# Patient Record
Sex: Female | Born: 1965 | Race: Black or African American | Hispanic: No | Marital: Single | Smoking: Former smoker
Health system: Southern US, Community
[De-identification: ages and names within clinical notes are randomized; demographics above are authoritative.]

## PROBLEM LIST (undated history)

## (undated) DIAGNOSIS — M1712 Unilateral primary osteoarthritis, left knee: Secondary | ICD-10-CM

## (undated) DIAGNOSIS — F319 Bipolar disorder, unspecified: Secondary | ICD-10-CM

## (undated) DIAGNOSIS — M722 Plantar fascial fibromatosis: Secondary | ICD-10-CM

## (undated) DIAGNOSIS — D219 Benign neoplasm of connective and other soft tissue, unspecified: Secondary | ICD-10-CM

## (undated) DIAGNOSIS — M109 Gout, unspecified: Secondary | ICD-10-CM

## (undated) DIAGNOSIS — Z5189 Encounter for other specified aftercare: Secondary | ICD-10-CM

## (undated) DIAGNOSIS — F419 Anxiety disorder, unspecified: Secondary | ICD-10-CM

## (undated) DIAGNOSIS — E119 Type 2 diabetes mellitus without complications: Secondary | ICD-10-CM

## (undated) DIAGNOSIS — F1910 Other psychoactive substance abuse, uncomplicated: Secondary | ICD-10-CM

## (undated) DIAGNOSIS — I10 Essential (primary) hypertension: Secondary | ICD-10-CM

## (undated) DIAGNOSIS — F32A Depression, unspecified: Secondary | ICD-10-CM

## (undated) HISTORY — DX: Anxiety disorder, unspecified: F41.9

## (undated) HISTORY — DX: Other psychoactive substance abuse, uncomplicated: F19.10

## (undated) HISTORY — DX: Unilateral primary osteoarthritis, left knee: M17.12

## (undated) HISTORY — DX: Depression, unspecified: F32.A

## (undated) HISTORY — DX: Type 2 diabetes mellitus without complications: E11.9

## (undated) HISTORY — PX: ABDOMINAL HYSTERECTOMY: SHX81

## (undated) HISTORY — DX: Plantar fascial fibromatosis: M72.2

## (undated) HISTORY — DX: Essential (primary) hypertension: I10

---
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
## 1999-07-03 ENCOUNTER — Ambulatory Visit (HOSPITAL_COMMUNITY): Admission: RE | Admit: 1999-07-03 | Discharge: 1999-07-03 | Payer: Self-pay | Admitting: *Deleted

## 1999-07-30 ENCOUNTER — Ambulatory Visit (HOSPITAL_COMMUNITY): Admission: RE | Admit: 1999-07-30 | Discharge: 1999-07-30 | Payer: Self-pay | Admitting: *Deleted

## 1999-09-07 ENCOUNTER — Inpatient Hospital Stay (HOSPITAL_COMMUNITY): Admission: AD | Admit: 1999-09-07 | Discharge: 1999-09-09 | Payer: Self-pay | Admitting: *Deleted

## 2000-12-11 ENCOUNTER — Emergency Department (HOSPITAL_COMMUNITY): Admission: EM | Admit: 2000-12-11 | Discharge: 2000-12-11 | Payer: Self-pay | Admitting: Emergency Medicine

## 2000-12-16 ENCOUNTER — Emergency Department (HOSPITAL_COMMUNITY): Admission: EM | Admit: 2000-12-16 | Discharge: 2000-12-16 | Payer: Self-pay | Admitting: Emergency Medicine

## 2001-09-25 ENCOUNTER — Encounter: Payer: Self-pay | Admitting: Emergency Medicine

## 2001-09-25 ENCOUNTER — Emergency Department (HOSPITAL_COMMUNITY): Admission: EM | Admit: 2001-09-25 | Discharge: 2001-09-25 | Payer: Self-pay | Admitting: Emergency Medicine

## 2001-09-26 ENCOUNTER — Emergency Department (HOSPITAL_COMMUNITY): Admission: EM | Admit: 2001-09-26 | Discharge: 2001-09-27 | Payer: Self-pay

## 2001-09-27 ENCOUNTER — Encounter: Payer: Self-pay | Admitting: Emergency Medicine

## 2008-03-30 ENCOUNTER — Emergency Department (HOSPITAL_COMMUNITY): Admission: EM | Admit: 2008-03-30 | Discharge: 2008-03-31 | Payer: Self-pay | Admitting: *Deleted

## 2010-04-12 ENCOUNTER — Emergency Department (HOSPITAL_COMMUNITY)
Admission: EM | Admit: 2010-04-12 | Discharge: 2010-04-13 | Disposition: A | Discharge status: Home / Self Care | Payer: Self-pay | Attending: Emergency Medicine | Admitting: Emergency Medicine

## 2010-04-12 DIAGNOSIS — R059 Cough, unspecified: Secondary | ICD-10-CM | POA: Insufficient documentation

## 2010-04-12 DIAGNOSIS — J069 Acute upper respiratory infection, unspecified: Secondary | ICD-10-CM | POA: Insufficient documentation

## 2010-04-12 DIAGNOSIS — R6883 Chills (without fever): Secondary | ICD-10-CM | POA: Insufficient documentation

## 2010-04-12 DIAGNOSIS — J3489 Other specified disorders of nose and nasal sinuses: Secondary | ICD-10-CM | POA: Insufficient documentation

## 2010-04-12 DIAGNOSIS — R05 Cough: Secondary | ICD-10-CM | POA: Insufficient documentation

## 2010-04-12 DIAGNOSIS — IMO0001 Reserved for inherently not codable concepts without codable children: Secondary | ICD-10-CM | POA: Insufficient documentation

## 2010-04-13 ENCOUNTER — Emergency Department (HOSPITAL_COMMUNITY): Payer: Self-pay

## 2010-12-28 ENCOUNTER — Emergency Department (HOSPITAL_COMMUNITY)
Admission: EM | Admit: 2010-12-28 | Discharge: 2010-12-28 | Disposition: A | Discharge status: Home / Self Care | Payer: Self-pay | Attending: Emergency Medicine | Admitting: Emergency Medicine

## 2010-12-28 ENCOUNTER — Encounter: Payer: Self-pay | Admitting: *Deleted

## 2010-12-28 ENCOUNTER — Emergency Department (HOSPITAL_COMMUNITY): Payer: Self-pay

## 2010-12-28 DIAGNOSIS — M79673 Pain in unspecified foot: Secondary | ICD-10-CM

## 2010-12-28 DIAGNOSIS — M7989 Other specified soft tissue disorders: Secondary | ICD-10-CM | POA: Insufficient documentation

## 2010-12-28 DIAGNOSIS — F17200 Nicotine dependence, unspecified, uncomplicated: Secondary | ICD-10-CM | POA: Insufficient documentation

## 2010-12-28 DIAGNOSIS — M79609 Pain in unspecified limb: Secondary | ICD-10-CM | POA: Insufficient documentation

## 2010-12-28 DIAGNOSIS — M109 Gout, unspecified: Secondary | ICD-10-CM | POA: Insufficient documentation

## 2010-12-28 HISTORY — DX: Encounter for other specified aftercare: Z51.89

## 2010-12-28 HISTORY — DX: Benign neoplasm of connective and other soft tissue, unspecified: D21.9

## 2010-12-28 LAB — DIFFERENTIAL
Eosinophils Relative: 2 % (ref 0–5)
Lymphocytes Relative: 23 % (ref 12–46)
Lymphs Abs: 2.8 10*3/uL (ref 0.7–4.0)
Monocytes Absolute: 1.1 10*3/uL — ABNORMAL HIGH (ref 0.1–1.0)
Monocytes Relative: 9 % (ref 3–12)
Neutro Abs: 8.2 10*3/uL — ABNORMAL HIGH (ref 1.7–7.7)

## 2010-12-28 LAB — POCT I-STAT, CHEM 8
BUN: 14 mg/dL (ref 6–23)
Calcium, Ion: 1.16 mmol/L (ref 1.12–1.32)
Chloride: 102 mEq/L (ref 96–112)
Creatinine, Ser: 1 mg/dL (ref 0.50–1.10)
Glucose, Bld: 94 mg/dL (ref 70–99)
Potassium: 3.5 mEq/L (ref 3.5–5.1)

## 2010-12-28 LAB — CBC
HCT: 43.5 % (ref 36.0–46.0)
Hemoglobin: 14.8 g/dL (ref 12.0–15.0)
MCV: 87.5 fL (ref 78.0–100.0)
RBC: 4.97 MIL/uL (ref 3.87–5.11)
RDW: 14.5 % (ref 11.5–15.5)
WBC: 12.3 10*3/uL — ABNORMAL HIGH (ref 4.0–10.5)

## 2010-12-28 MED ORDER — OXYCODONE-ACETAMINOPHEN 5-325 MG PO TABS: 1.0000 | ORAL_TABLET | Freq: Four times a day (QID) | ORAL | Status: DC | PRN

## 2010-12-28 MED ORDER — OXYCODONE-ACETAMINOPHEN 5-325 MG PO TABS
ORAL_TABLET | ORAL | Status: AC
  Administered 2010-12-28: 1
  Filled 2010-12-28: qty 1

## 2010-12-28 MED ORDER — PREDNISONE 50 MG PO TABS: 50.0000 mg | ORAL_TABLET | Freq: Every day | ORAL | Status: DC

## 2010-12-28 NOTE — ED Notes (Signed)
Pt reports walking to a friends house when she had sudden onset of (L) foot swelling and pain.  No bruising, deformity or swelling noted to foot. Pt noted to be laying on leg on assessment.  Pt given a gown to change into.  Pt moving extremity slowly but without difficulty.  Pt resting prior to RN assessment.  Skin warm, dry and intact.  Neuro intact.

## 2010-12-28 NOTE — ED Notes (Signed)
Patient reports sudden onset right foot pain on the top of the foot states it is swollen. When asked to rate pain patient is unable to stay awake for questioning.

## 2010-12-28 NOTE — ED Notes (Signed)
Social work paged for pt to receive Electronics engineer. Pt refused bus pas due to foot pain. sts unable to walk. Pt given crutches

## 2010-12-28 NOTE — ED Provider Notes (Signed)
History     CSN: [id number] Arrival date & time: 12/28/2010  2:24 AM   First MD Initiated Contact with Patient 12/28/10 0308      Chief Complaint  Patient presents with  . Foot Pain     HPI  History provided by the patient. Patient presents with complaints of acute left foot pain that began while she was asleep. She denies similar symptoms in the past. Pain is worse with any movement or palpation of foot. Pain is located in the whole foot. Patient denies any injury or trauma to foot. Patient did not try taking anything for the pain. She denies any alleviating factors. She denies any significant past medical history.   Past Medical History  Diagnosis Date  . Blood transfusion   . Fibroid tumor     Past Surgical History  Procedure Date  . Abdominal surgery     History reviewed. No pertinent family history.  History  Substance Use Topics  . Smoking status: Current Everyday Smoker  . Smokeless tobacco: Not on file  . Alcohol Use: Yes     "a little bit"    OB History    Grav Para Term Preterm Abortions TAB SAB Ect Mult Living                  Review of Systems  Constitutional: Negative for fever.  Respiratory: Negative for shortness of breath.   Cardiovascular: Negative for chest pain and palpitations.  All other systems reviewed and are negative.    Allergies  Penicillins  Home Medications  No current outpatient prescriptions on file.  BP 105/73  Pulse 108  Temp(Src) 97.5 F (36.4 C) (Oral)  Resp 23  SpO2 95%  LMP 12/28/2010  Physical Exam  Nursing note and vitals reviewed. Constitutional: She is oriented to person, place, and time. She appears well-developed and well-nourished. No distress.  HENT:  Head: Normocephalic.  Cardiovascular: Normal rate, regular rhythm and normal heart sounds.   Pulmonary/Chest: Effort normal.  Musculoskeletal:       Mild swelling and increased warmth to the entire left foot. Patient with tenderness to palpation even  with very light touch over dorsal and plantar aspects of left foot. Patient able to move toes normally. No deformity or crepitus the foot.  Neurological: She is alert and oriented to person, place, and time.  Skin: Skin is warm. No rash noted. No erythema.  Psychiatric: She has a normal mood and affect. Her behavior is normal.    ED Course  Procedures (including critical care time)  Labs Reviewed  CBC - Abnormal; Notable for the following:    WBC 12.3 (*)    All other components within normal limits  DIFFERENTIAL - Abnormal; Notable for the following:    Neutro Abs 8.2 (*)    Monocytes Absolute 1.1 (*)    All other components within normal limits  POCT I-STAT, CHEM 8 - Abnormal; Notable for the following:    Hemoglobin 15.6 (*)    All other components within normal limits  URIC ACID  I-STAT, CHEM 8   Results for orders placed during the hospital encounter of 12/28/10  CBC      Component Value Range   WBC 12.3 (*) 4.0 - 10.5 (K/uL)   RBC 4.97  3.87 - 5.11 (MIL/uL)   Hemoglobin 14.8  12.0 - 15.0 (g/dL)   HCT 86.5  78.4 - 69.6 (%)   MCV 87.5  78.0 - 100.0 (fL)   MCH 29.8  26.0 -  34.0 (pg)   MCHC 34.0  30.0 - 36.0 (g/dL)   RDW 40.9  81.1 - 91.4 (%)   Platelets 376  150 - 400 (K/uL)  DIFFERENTIAL      Component Value Range   Neutrophils Relative 67  43 - 77 (%)   Neutro Abs 8.2 (*) 1.7 - 7.7 (K/uL)   Lymphocytes Relative 23  12 - 46 (%)   Lymphs Abs 2.8  0.7 - 4.0 (K/uL)   Monocytes Relative 9  3 - 12 (%)   Monocytes Absolute 1.1 (*) 0.1 - 1.0 (K/uL)   Eosinophils Relative 2  0 - 5 (%)   Eosinophils Absolute 0.2  0.0 - 0.7 (K/uL)   Basophils Relative 0  0 - 1 (%)   Basophils Absolute 0.0  0.0 - 0.1 (K/uL)  URIC ACID      Component Value Range   Uric Acid, Serum 5.2  2.4 - 7.0 (mg/dL)  POCT I-STAT, CHEM 8      Component Value Range   Sodium 138  135 - 145 (mEq/L)   Potassium 3.5  3.5 - 5.1 (mEq/L)   Chloride 102  96 - 112 (mEq/L)   BUN 14  6 - 23 (mg/dL)    Creatinine, Ser 7.82  0.50 - 1.10 (mg/dL)   Glucose, Bld 94  70 - 99 (mg/dL)   Calcium, Ion 9.56  2.13 - 1.32 (mmol/L)   TCO2 25  0 - 100 (mmol/L)   Hemoglobin 15.6 (*) 12.0 - 15.0 (g/dL)   HCT 08.6  57.8 - 46.9 (%)     No results found.   No diagnosis found.    MDM  4:15AM patient seen and evaluated. Patient in no acute distress   6 clock a.m. patient discussed in sign out with Lawyer PA-C. He will follow results of x-ray and dispo patient appropriately.     Angus Seller, PA 12/28/10 0630

## 2010-12-28 NOTE — ED Notes (Signed)
I gave the patient a warm blanket. 

## 2010-12-28 NOTE — ED Notes (Signed)
Pt was seen by social work and informed that she is unable to receive a cab voucher. Pt given bus pass. Pt was very upset and snatched her discharge papers and bus pass out of my hands and walked away with no problem.

## 2010-12-28 NOTE — Progress Notes (Signed)
CSW received call from RN French Ana re: transportation:  RN stated a bus pass was offered for which pt refused stating EMS informed her a taxi voucher would be provided. RN confirmed pt has been cleared for safe d/c, medicated, provided crutches and has been witnessed walking with no noted distress to foot. ( pt is dx with gout). CSW met with pt to assess situation. Pt was initially tearful as she spoke of the need for a cab yet quickly became angry coupled with verbal aggression. CSW empathized and offered to call her emergency contacts listed to assist with transportation as well as exploring barriers to riding a bus informing pt a cab voucher will not be provided.  Pt expressed she is not able to walk and accused this Clinical research associate and ED staff of being "worthless".  CSW again attempted to explore barriers to riding the bus explaining she does not have to change busses on her route and this Clinical research associate and/or tech would be able to wheel her to the bus stop. Pt demanded to speak to the Supervisor for which this Clinical research associate requested.   Pt continued to verbalize aggressive mannerism as this Clinical research associate attempted to de-escalate. CSW witnessed pt to stand up and walk with her crutches  with no issues down the hall where she began to complain to a visitor and ask for assistance. Pt yelled down the hall to get her the "dam bus pass and her discharge documents" and to stop looking at her. CSW and pts bedside RN French Ana provided pt x1 bus pass and d/c documents for which pt aggressively snatched the documents from the RN. Pt denied any further CSW or RN needs. Pt has been discharged with safe transportation option, RX, and printed documentation of her dx, tx plan and f/u recommendations.  Refer to RN documentation for additional information surrounding this issue.   Dionne Milo MSW, LCSWA [facility] Emergency Dept. Weekend/Social Worker [phone]

## 2010-12-28 NOTE — ED Notes (Signed)
Sudden onset of  L leg to foot pain, (pain does not go into hip, buttocks or groin), denies sx other than pain. Denies injury. Onset around , here by EMS.

## 2010-12-28 NOTE — ED Provider Notes (Signed)
Medical screening examination/treatment/procedure(s) were performed by non-physician practitioner and as supervising physician I was immediately available for consultation/collaboration.   Zainab Crumrine, MD 12/28/10 1551 

## 2010-12-31 NOTE — Progress Notes (Signed)
Patient returned to the ED today in search of assistance on filling the two prescriptions she received on 12/28/2010. I explained to pt that we could not assist with the Percocet and she accepted the explanation. I did let her know that I contacted [facility] and discovered the out-of-pocket cost would be less than $6. She will ask a friend if they have money to lend her. Per the pharmacy, pt is eligible for the [facility] fund, so we could fill the 5-day prescription for prednisone for her. Patient was very appreciative of the services we provided.

## 2011-01-08 ENCOUNTER — Emergency Department (HOSPITAL_COMMUNITY)
Admission: EM | Admit: 2011-01-08 | Discharge: 2011-01-08 | Disposition: A | Discharge status: Home / Self Care | Payer: Self-pay | Attending: Emergency Medicine | Admitting: Emergency Medicine

## 2011-01-08 ENCOUNTER — Emergency Department (HOSPITAL_COMMUNITY): Payer: Self-pay

## 2011-01-08 ENCOUNTER — Encounter (HOSPITAL_COMMUNITY): Payer: Self-pay | Admitting: *Deleted

## 2011-01-08 DIAGNOSIS — R1033 Periumbilical pain: Secondary | ICD-10-CM | POA: Insufficient documentation

## 2011-01-08 DIAGNOSIS — R197 Diarrhea, unspecified: Secondary | ICD-10-CM | POA: Insufficient documentation

## 2011-01-08 DIAGNOSIS — F17200 Nicotine dependence, unspecified, uncomplicated: Secondary | ICD-10-CM | POA: Insufficient documentation

## 2011-01-08 DIAGNOSIS — R112 Nausea with vomiting, unspecified: Secondary | ICD-10-CM | POA: Insufficient documentation

## 2011-01-08 LAB — DIFFERENTIAL
Basophils Absolute: 0 10*3/uL (ref 0.0–0.1)
Basophils Relative: 0 % (ref 0–1)
Eosinophils Relative: 2 % (ref 0–5)
Monocytes Absolute: 0.5 10*3/uL (ref 0.1–1.0)
Monocytes Relative: 5 % (ref 3–12)
Neutro Abs: 6.9 10*3/uL (ref 1.7–7.7)

## 2011-01-08 LAB — CBC
HCT: 46.7 % — ABNORMAL HIGH (ref 36.0–46.0)
Hemoglobin: 15.6 g/dL — ABNORMAL HIGH (ref 12.0–15.0)
MCHC: 33.4 g/dL (ref 30.0–36.0)
MCV: 87.8 fL (ref 78.0–100.0)
RDW: 14.2 % (ref 11.5–15.5)

## 2011-01-08 LAB — PREGNANCY, URINE: Preg Test, Ur: NEGATIVE

## 2011-01-08 LAB — COMPREHENSIVE METABOLIC PANEL
AST: 23 U/L (ref 0–37)
Albumin: 4.9 g/dL (ref 3.5–5.2)
BUN: 17 mg/dL (ref 6–23)
CO2: 27 mEq/L (ref 19–32)
Calcium: 10.3 mg/dL (ref 8.4–10.5)
Chloride: 103 mEq/L (ref 96–112)
Creatinine, Ser: 0.96 mg/dL (ref 0.50–1.10)
GFR calc non Af Amer: 69 mL/min — ABNORMAL LOW (ref >90)
Total Bilirubin: 0.3 mg/dL (ref 0.3–1.2)

## 2011-01-08 LAB — LIPASE, BLOOD: Lipase: 25 U/L (ref 11–59)

## 2011-01-08 LAB — URINALYSIS, ROUTINE W REFLEX MICROSCOPIC
Leukocytes, UA: NEGATIVE
Nitrite: NEGATIVE
Protein, ur: NEGATIVE mg/dL
Urobilinogen, UA: 1 mg/dL (ref 0.0–1.0)

## 2011-01-08 MED ORDER — ONDANSETRON HCL 4 MG/2ML IJ SOLN
4.0000 mg | Freq: Once | INTRAMUSCULAR | Status: AC
  Administered 2011-01-08: 4 mg via INTRAVENOUS
  Filled 2011-01-08: qty 2

## 2011-01-08 MED ORDER — HYDROMORPHONE HCL PF 1 MG/ML IJ SOLN
1.0000 mg | Freq: Once | INTRAMUSCULAR | Status: AC
  Administered 2011-01-08: 1 mg via INTRAVENOUS
  Filled 2011-01-08: qty 1

## 2011-01-08 MED ORDER — HYDROCODONE-ACETAMINOPHEN 5-325 MG PO TABS: 1.0000 | ORAL_TABLET | Freq: Four times a day (QID) | ORAL | Status: AC | PRN

## 2011-01-08 MED ORDER — PROMETHAZINE HCL 25 MG PO TABS: 25.0000 mg | ORAL_TABLET | Freq: Four times a day (QID) | ORAL | Status: AC | PRN

## 2011-01-08 MED ORDER — ONDANSETRON 8 MG PO TBDP: 8.0000 mg | ORAL_TABLET | Freq: Three times a day (TID) | ORAL | Status: AC | PRN

## 2011-01-08 MED ORDER — SODIUM CHLORIDE 0.9 % IV BOLUS (SEPSIS)
1000.0000 mL | Freq: Once | INTRAVENOUS | Status: AC
  Administered 2011-01-08: 1000 mL via INTRAVENOUS

## 2011-01-08 NOTE — ED Provider Notes (Signed)
CT of the abdomen negative for complete or partial bowel obstruction however does show some inflammatory changes to the small bowel which may be related to an enteritis which could be viral in etiology patient's white count is not elevated no acute abdominal process noted on CT.   Discharge patient home with antinausea medicine and pain medicine and she has followup available.  Shelda Jakes, MD 01/08/11 [phone]

## 2011-01-08 NOTE — ED Notes (Signed)
[id number]<AV> Expected date:01/08/11<BR> Expected time: 1:24 PM<BR> Means of arrival:Ambulance<BR> Comments:<BR> M80

## 2011-01-08 NOTE — ED Notes (Signed)
Into assess pt, pt resting with eyes closed, upon arousal pt states she is hurting and wants some pain meds now.  Told pt I would check with provider for further orders.

## 2011-01-08 NOTE — ED Provider Notes (Signed)
History     CSN: [id number] Arrival date & time: 01/08/2011  1:45 PM   First MD Initiated Contact with Patient 01/08/11 1405      Chief Complaint  Patient presents with  . Abdominal Pain    Per EMS pt ate fish last night and experienced severe abd pain with nausea and vomiting x's 4 today.     (Consider location/radiation/quality/duration/timing/severity/associated sxs/prior treatment) HPI Comments: Patient presents with periumbilical pain that started last night shortly after eating dinner. She had 4 episodes of nausea and vomiting and is unable to keep any down today. She's also had 2 episodes of loose stools. She denies fevers, chills, dysuria, hematuria, vaginal bleeding or discharge. She denies any chest pain or shortness of breath.  She's had abdominal pain like this before but never this severe. He's had surgery in the past for fibroid tumors.  The history is provided by the patient.    Past Medical History  Diagnosis Date  . Blood transfusion   . Fibroid tumor     Past Surgical History  Procedure Date  . Abdominal surgery     History reviewed. No pertinent family history.  History  Substance Use Topics  . Smoking status: Current Everyday Smoker  . Smokeless tobacco: Not on file  . Alcohol Use: Yes     "a little bit"    OB History    Grav Para Term Preterm Abortions TAB SAB Ect Mult Living                  Review of Systems  Constitutional: Positive for appetite change. Negative for fever, activity change and fatigue.  HENT: Negative for congestion and rhinorrhea.   Respiratory: Negative for cough, chest tightness and shortness of breath.   Cardiovascular: Negative for chest pain.  Gastrointestinal: Positive for nausea, vomiting, abdominal pain and diarrhea.  Genitourinary: Negative for dysuria, hematuria, vaginal bleeding and vaginal discharge.  Musculoskeletal: Negative for back pain.  Skin: Negative for rash.  Neurological: Negative for weakness and  headaches.    Allergies  Penicillins  Home Medications   Current Outpatient Rx  Name Route Sig Dispense Refill  . OXYCODONE-ACETAMINOPHEN 5-325 MG PO TABS Oral Take 1 tablet by mouth every 6 (six) hours as needed. pain     . PREDNISONE 50 MG PO TABS Oral Take 50 mg by mouth daily. Pt states she did not start this medication yet.       BP 144/83  Pulse 71  Temp(Src) 99.1 F (37.3 C) (Oral)  Resp 16  SpO2 100%  LMP 12/28/2010  Physical Exam  Constitutional: She is oriented to person, place, and time. She appears well-developed and well-nourished. She appears distressed.       Holding abdomen, rocking back and forth in bed  HENT:  Head: Normocephalic and atraumatic.  Mouth/Throat: Oropharynx is clear and moist. No oropharyngeal exudate.  Eyes: Conjunctivae and EOM are normal. Pupils are equal, round, and reactive to light.  Neck: Normal range of motion. Neck supple.  Cardiovascular: Normal rate, regular rhythm and normal heart sounds.   Pulmonary/Chest: Effort normal and breath sounds normal. No respiratory distress.  Abdominal: Soft. There is tenderness. There is guarding.       Periumbilical tenderness with guarding.  Negative Murphy's sign.  No pain at McBurney's point.    Musculoskeletal: Normal range of motion. She exhibits no edema and no tenderness.  Neurological: She is alert and oriented to person, place, and time. No cranial nerve deficit.  Skin:  Skin is warm.    ED Course  Procedures (including critical care time)  Labs Reviewed  CBC - Abnormal; Notable for the following:    RBC 5.32 (*)    Hemoglobin 15.6 (*)    HCT 46.7 (*)    Platelets 430 (*)    All other components within normal limits  COMPREHENSIVE METABOLIC PANEL - Abnormal; Notable for the following:    Total Protein 9.2 (*)    GFR calc non Af Amer 69 (*)    GFR calc Af Amer 80 (*)    All other components within normal limits  URINALYSIS, ROUTINE W REFLEX MICROSCOPIC - Abnormal; Notable for the  following:    Ketones, ur TRACE (*)    All other components within normal limits  DIFFERENTIAL  LIPASE, BLOOD  PREGNANCY, URINE   No results found.   No diagnosis found.    MDM  Abdominal pain, nausea, vomiting, diarrhea for the past 24 hours. Vitals are stable patient is in significant amount of pain and difficult to examine.  We'll obtain labs, urinalysis, treat symptoms and provide IV hydration.   Pending CT abdomen at change of shift.  Dr. Deretha Emory to disposition patient.     Glynn Octave, MD 01/08/11 [phone]

## 2013-06-30 ENCOUNTER — Emergency Department (HOSPITAL_COMMUNITY): Payer: Self-pay

## 2013-06-30 ENCOUNTER — Inpatient Hospital Stay (HOSPITAL_COMMUNITY)
Admission: EM | Admit: 2013-06-30 | Discharge: 2013-07-02 | DRG: 153 | Disposition: A | Discharge status: Home / Self Care | Payer: Self-pay | Attending: Internal Medicine | Admitting: Internal Medicine

## 2013-06-30 ENCOUNTER — Encounter (HOSPITAL_COMMUNITY): Payer: Self-pay | Admitting: Emergency Medicine

## 2013-06-30 DIAGNOSIS — IMO0002 Reserved for concepts with insufficient information to code with codable children: Secondary | ICD-10-CM

## 2013-06-30 DIAGNOSIS — Z79899 Other long term (current) drug therapy: Secondary | ICD-10-CM

## 2013-06-30 DIAGNOSIS — J039 Acute tonsillitis, unspecified: Secondary | ICD-10-CM

## 2013-06-30 DIAGNOSIS — F319 Bipolar disorder, unspecified: Secondary | ICD-10-CM | POA: Diagnosis present

## 2013-06-30 DIAGNOSIS — J36 Peritonsillar abscess: Secondary | ICD-10-CM | POA: Diagnosis present

## 2013-06-30 DIAGNOSIS — Z880 Allergy status to penicillin: Secondary | ICD-10-CM

## 2013-06-30 DIAGNOSIS — B950 Streptococcus, group A, as the cause of diseases classified elsewhere: Secondary | ICD-10-CM | POA: Diagnosis present

## 2013-06-30 DIAGNOSIS — F17200 Nicotine dependence, unspecified, uncomplicated: Secondary | ICD-10-CM | POA: Diagnosis present

## 2013-06-30 DIAGNOSIS — D72829 Elevated white blood cell count, unspecified: Secondary | ICD-10-CM

## 2013-06-30 DIAGNOSIS — J020 Streptococcal pharyngitis: Principal | ICD-10-CM | POA: Diagnosis present

## 2013-06-30 HISTORY — DX: Gout, unspecified: M10.9

## 2013-06-30 HISTORY — DX: Bipolar disorder, unspecified: F31.9

## 2013-06-30 LAB — COMPREHENSIVE METABOLIC PANEL
ALT: 9 U/L (ref 0–35)
AST: 11 U/L (ref 0–37)
Albumin: 4.1 g/dL (ref 3.5–5.2)
Alkaline Phosphatase: 56 U/L (ref 39–117)
BILIRUBIN TOTAL: 0.4 mg/dL (ref 0.3–1.2)
BUN: 14 mg/dL (ref 6–23)
CALCIUM: 9.5 mg/dL (ref 8.4–10.5)
CHLORIDE: 102 meq/L (ref 96–112)
CO2: 23 meq/L (ref 19–32)
CREATININE: 1.06 mg/dL (ref 0.50–1.10)
GFR, EST AFRICAN AMERICAN: 70 mL/min — AB (ref >90)
GFR, EST NON AFRICAN AMERICAN: 60 mL/min — AB (ref >90)
GLUCOSE: 104 mg/dL — AB (ref 70–99)
Potassium: 3.7 mEq/L (ref 3.7–5.3)
Sodium: 140 mEq/L (ref 137–147)
Total Protein: 7.8 g/dL (ref 6.0–8.3)

## 2013-06-30 LAB — CBC WITH DIFFERENTIAL/PLATELET
Basophils Absolute: 0 10*3/uL (ref 0.0–0.1)
Basophils Relative: 0 % (ref 0–1)
EOS PCT: 0 % (ref 0–5)
Eosinophils Absolute: 0 10*3/uL (ref 0.0–0.7)
HEMATOCRIT: 42.7 % (ref 36.0–46.0)
HEMOGLOBIN: 14.2 g/dL (ref 12.0–15.0)
LYMPHS ABS: 2.5 10*3/uL (ref 0.7–4.0)
LYMPHS PCT: 14 % (ref 12–46)
MCH: 29.3 pg (ref 26.0–34.0)
MCHC: 33.3 g/dL (ref 30.0–36.0)
MCV: 88.2 fL (ref 78.0–100.0)
MONO ABS: 0.9 10*3/uL (ref 0.1–1.0)
Monocytes Relative: 5 % (ref 3–12)
Neutro Abs: 14.3 10*3/uL — ABNORMAL HIGH (ref 1.7–7.7)
Neutrophils Relative %: 81 % — ABNORMAL HIGH (ref 43–77)
Platelets: 320 10*3/uL (ref 150–400)
RBC: 4.84 MIL/uL (ref 3.87–5.11)
RDW: 14.6 % (ref 11.5–15.5)
WBC: 17.8 10*3/uL — AB (ref 4.0–10.5)

## 2013-06-30 LAB — TROPONIN I: Troponin I: <0.3 ng/mL (ref <0.30)

## 2013-06-30 LAB — URINALYSIS, ROUTINE W REFLEX MICROSCOPIC
BILIRUBIN URINE: NEGATIVE
Glucose, UA: NEGATIVE mg/dL
Hgb urine dipstick: NEGATIVE
Ketones, ur: NEGATIVE mg/dL
Leukocytes, UA: NEGATIVE
NITRITE: NEGATIVE
PROTEIN: NEGATIVE mg/dL
SPECIFIC GRAVITY, URINE: 1.023 (ref 1.005–1.030)
UROBILINOGEN UA: 0.2 mg/dL (ref 0.0–1.0)
pH: 6 (ref 5.0–8.0)

## 2013-06-30 LAB — RAPID STREP SCREEN (MED CTR MEBANE ONLY): Streptococcus, Group A Screen (Direct): POSITIVE — AB

## 2013-06-30 LAB — LIPASE, BLOOD: LIPASE: 18 U/L (ref 11–59)

## 2013-06-30 LAB — POC URINE PREG, ED: Preg Test, Ur: NEGATIVE

## 2013-06-30 MED ORDER — DEXAMETHASONE SODIUM PHOSPHATE 10 MG/ML IJ SOLN: 10.0000 mg | Freq: Three times a day (TID) | INTRAMUSCULAR | Status: DC

## 2013-06-30 MED ORDER — ENOXAPARIN SODIUM 40 MG/0.4ML ~~LOC~~ SOLN
40.0000 mg | SUBCUTANEOUS | Status: DC
  Administered 2013-06-30 – 2013-07-01 (×2): 40 mg via SUBCUTANEOUS
  Filled 2013-06-30 (×4): qty 0.4

## 2013-06-30 MED ORDER — ACETAMINOPHEN 650 MG RE SUPP: 650.0000 mg | Freq: Four times a day (QID) | RECTAL | Status: DC | PRN

## 2013-06-30 MED ORDER — ACETAMINOPHEN 325 MG PO TABS
650.0000 mg | ORAL_TABLET | Freq: Four times a day (QID) | ORAL | Status: DC | PRN
  Administered 2013-07-01: 650 mg via ORAL
  Filled 2013-06-30: qty 2

## 2013-06-30 MED ORDER — SODIUM CHLORIDE 0.9 % IV SOLN
INTRAVENOUS | Status: DC
  Administered 2013-06-30 – 2013-07-01 (×2): via INTRAVENOUS

## 2013-06-30 MED ORDER — CLINDAMYCIN PHOSPHATE 600 MG/50ML IV SOLN
600.0000 mg | Freq: Three times a day (TID) | INTRAVENOUS | Status: DC
  Administered 2013-06-30: 600 mg via INTRAVENOUS
  Filled 2013-06-30: qty 50

## 2013-06-30 MED ORDER — SODIUM CHLORIDE 0.9 % IV BOLUS (SEPSIS)
1000.0000 mL | Freq: Once | INTRAVENOUS | Status: AC
  Administered 2013-06-30: 1000 mL via INTRAVENOUS

## 2013-06-30 MED ORDER — DEXAMETHASONE SODIUM PHOSPHATE 10 MG/ML IJ SOLN
10.0000 mg | Freq: Three times a day (TID) | INTRAMUSCULAR | Status: AC
  Administered 2013-06-30 – 2013-07-01 (×3): 10 mg via INTRAVENOUS
  Filled 2013-06-30 (×3): qty 1

## 2013-06-30 MED ORDER — CLINDAMYCIN PHOSPHATE 600 MG/50ML IV SOLN
600.0000 mg | Freq: Four times a day (QID) | INTRAVENOUS | Status: DC
Start: 2013-07-01 — End: 2013-07-02
  Administered 2013-06-30 – 2013-07-02 (×6): 600 mg via INTRAVENOUS
  Filled 2013-06-30 (×8): qty 50

## 2013-06-30 MED ORDER — ACETAMINOPHEN 650 MG RE SUPP
650.0000 mg | Freq: Once | RECTAL | Status: AC
  Administered 2013-06-30: 650 mg via RECTAL
  Filled 2013-06-30: qty 1

## 2013-06-30 MED ORDER — CHLORHEXIDINE GLUCONATE 0.12 % MT SOLN
15.0000 mL | Freq: Four times a day (QID) | OROMUCOSAL | Status: DC
  Administered 2013-06-30 – 2013-07-02 (×7): 15 mL via OROMUCOSAL
  Filled 2013-06-30 (×7): qty 15

## 2013-06-30 MED ORDER — ONDANSETRON HCL 4 MG/2ML IJ SOLN: 4.0000 mg | Freq: Four times a day (QID) | INTRAMUSCULAR | Status: DC | PRN

## 2013-06-30 MED ORDER — ONDANSETRON HCL 4 MG PO TABS: 4.0000 mg | ORAL_TABLET | Freq: Four times a day (QID) | ORAL | Status: DC | PRN

## 2013-06-30 MED ORDER — DEXAMETHASONE SODIUM PHOSPHATE 10 MG/ML IJ SOLN
10.0000 mg | Freq: Once | INTRAMUSCULAR | Status: AC
  Administered 2013-06-30: 10 mg via INTRAVENOUS
  Filled 2013-06-30: qty 1

## 2013-06-30 MED ORDER — BACID PO TABS
2.0000 | ORAL_TABLET | Freq: Three times a day (TID) | ORAL | Status: DC
  Administered 2013-06-30 – 2013-07-02 (×5): 2 via ORAL
  Filled 2013-06-30 (×9): qty 2

## 2013-06-30 MED ORDER — MORPHINE SULFATE 2 MG/ML IJ SOLN
1.0000 mg | INTRAMUSCULAR | Status: DC | PRN
  Administered 2013-07-01: 1 mg via INTRAVENOUS
  Filled 2013-06-30: qty 1

## 2013-06-30 MED ORDER — CLINDAMYCIN PHOSPHATE 600 MG/50ML IV SOLN: 600.0000 mg | Freq: Four times a day (QID) | INTRAVENOUS | Status: DC

## 2013-06-30 MED ORDER — IOHEXOL 300 MG/ML  SOLN
75.0000 mL | Freq: Once | INTRAMUSCULAR | Status: AC | PRN
  Administered 2013-06-30: 75 mL via INTRAVENOUS

## 2013-06-30 MED ORDER — DEXTROSE 5 % IV SOLN
100.0000 mg | Freq: Two times a day (BID) | INTRAVENOUS | Status: DC
  Administered 2013-06-30 – 2013-07-02 (×4): 100 mg via INTRAVENOUS
  Filled 2013-06-30 (×7): qty 100

## 2013-06-30 NOTE — ED Notes (Signed)
Pt medicated for fever.

## 2013-06-30 NOTE — ED Provider Notes (Signed)
CSN: [id number]     Arrival date & time 06/30/13  1119 History   First MD Initiated Contact with Patient 06/30/13 1120     No chief complaint on file.    (Consider location/radiation/quality/duration/timing/severity/associated sxs/prior Treatment) The history is provided by the patient. No language interpreter was used.  Sydney Harris is a 48 year old female with past medical history of blood transfusion and fibroid tumors presenting to the ED with generalized bodyaches, productive cough, nasal congestion, sore throat that has been ongoing for approximately one week. Patient reports that she has soreness when swallowing. Reported that when she coughs she produces a thick clear/whitish sputum. Stated that she's been having fevers intermittently throughout the course of the week-subjective. Reported that she started to experience a headache a couple of days ago described as a throbbing sensation localized to the right side of her head. Stated that she has mild abdominal discomfort described as a sharpness sensation to the upper portion of her abdomen. Patient has been using Aleve with minimal relief. Denied using any other over-the-counter medications. Patient reports she smokes approximately 2-3 cigarettes per day. Denied nausea, vomiting, diarrhea, melena, hematochezia, difficulty swallowing, blurred vision, sudden loss of vision, neck pain, neck stiffness, ear pain, sick contacts. PCP none  Past Medical History  Diagnosis Date  . Blood transfusion   . Fibroid tumor    Past Surgical History  Procedure Laterality Date  . Abdominal surgery     History reviewed. No pertinent family history. History  Substance Use Topics  . Smoking status: Current Every Day Smoker  . Smokeless tobacco: Not on file  . Alcohol Use: Yes     Comment: "a little bit"   OB History   Grav Para Term Preterm Abortions TAB SAB Ect Mult Living                 Review of Systems  Constitutional: Positive for  fever. Negative for chills.  HENT: Positive for congestion and sore throat. Negative for ear pain and trouble swallowing.   Respiratory: Positive for cough. Negative for shortness of breath.   Cardiovascular: Negative for chest pain.  Gastrointestinal: Positive for abdominal pain. Negative for nausea, vomiting, diarrhea, constipation, blood in stool and anal bleeding.  Musculoskeletal: Positive for myalgias (Generalized). Negative for back pain, neck pain and neck stiffness.  Neurological: Positive for headaches. Negative for dizziness and weakness.      Allergies  Penicillins  Home Medications   Prior to Admission medications   Medication Sig Start Date End Date Taking? Authorizing Provider  ibuprofen (ADVIL) 200 MG tablet Take 200 mg by mouth once.   Yes Historical Provider, MD   BP 130/80  Pulse 94  Temp(Src) 99.5 F (37.5 C) (Oral)  Resp 20  Ht 5' 3"  (1.6 m)  Wt 200 lb (90.719 kg)  BMI 35.44 kg/m2  SpO2 99%  LMP 12/28/2010 Physical Exam  Nursing note and vitals reviewed. Constitutional: She is oriented to person, place, and time. She appears well-developed and well-nourished. No distress.  HENT:  Head: Normocephalic and atraumatic.  Bilateral tonsillar adenopathy, right more so than the left, and erythema with exudate noted. Most exudate noted the right tonsil. Mild petechiae identified to the soft palate. Mild uvula swelling noted. Posterior oropharynx noted to be erythematous. Negative trismus. Uvula midline with symmetrical elevation. Negative deviation of the uvula noted.  Eyes: Conjunctivae and EOM are normal. Pupils are equal, round, and reactive to light. Right eye exhibits no discharge. Left eye exhibits no discharge.  Neck: Normal range of motion. Neck supple. No tracheal deviation present.  Negative neck stiffness Negative nuchal rigidity Negative meningeal signs  Bilateral tonsillar adenopathy with tenderness upon palpation-mobile, soft  Cardiovascular:  Normal rate, regular rhythm and normal heart sounds.  Exam reveals no friction rub.   No murmur heard. Pulses:      Radial pulses are 2+ on the right side, and 2+ on the left side.       Dorsalis pedis pulses are 2+ on the right side, and 2+ on the left side.  Cap refill less than 3 seconds Negative swelling or pitting edema identified to lower tremors bilaterally  Pulmonary/Chest: Effort normal and breath sounds normal. No respiratory distress. She has no wheezes. She has no rales.  Patient is able to speak in full sentences without difficulty Negative use of accessory muscles Negative stridor  Abdominal: Soft. Bowel sounds are normal. She exhibits no distension. There is tenderness. There is no rebound and no guarding.  Negative abdominal distention Abdomen soft upon palpation Negative peritoneal signs Negative rigidity or guarding noted Mild discomfort upon palpation to the epigastric and right upper quadrant  Musculoskeletal: Normal range of motion.  Neurological: She is alert and oriented to person, place, and time. No cranial nerve deficit. She exhibits normal muscle tone. Coordination normal.  Cranial nerves III-XII grossly intact Strength 5+/5+ to upper and lower extremities bilaterally with resistance applied, equal distribution noted Equal grip strength bilaterally  Skin: Skin is warm and dry. No rash noted. She is not diaphoretic. No erythema.  Psychiatric: She has a normal mood and affect. Her behavior is normal. Thought content normal.    ED Course  Procedures (including critical care time)  4:52 PM This provider spoke with Dr. Simeon Craft who saw and assessed patient. Recommended patient to be admitted to the hospital for IV antibiotics - reported that patient declined surgery.   5:01 PM This provider spoke with Dr. Inis Sizer - discussed case, history, labs, imaging in great detail. Patient to be admitted to inpatient to [facility].   Results for orders placed during the hospital  encounter of 06/30/13  RAPID STREP SCREEN      Result Value Ref Range   Streptococcus, Group A Screen (Direct) POSITIVE (*) NEGATIVE  CBC WITH DIFFERENTIAL      Result Value Ref Range   WBC 17.8 (*) 4.0 - 10.5 K/uL   RBC 4.84  3.87 - 5.11 MIL/uL   Hemoglobin 14.2  12.0 - 15.0 g/dL   HCT 42.7  36.0 - 46.0 %   MCV 88.2  78.0 - 100.0 fL   MCH 29.3  26.0 - 34.0 pg   MCHC 33.3  30.0 - 36.0 g/dL   RDW 14.6  11.5 - 15.5 %   Platelets 320  150 - 400 K/uL   Neutrophils Relative % 81 (*) 43 - 77 %   Neutro Abs 14.3 (*) 1.7 - 7.7 K/uL   Lymphocytes Relative 14  12 - 46 %   Lymphs Abs 2.5  0.7 - 4.0 K/uL   Monocytes Relative 5  3 - 12 %   Monocytes Absolute 0.9  0.1 - 1.0 K/uL   Eosinophils Relative 0  0 - 5 %   Eosinophils Absolute 0.0  0.0 - 0.7 K/uL   Basophils Relative 0  0 - 1 %   Basophils Absolute 0.0  0.0 - 0.1 K/uL  COMPREHENSIVE METABOLIC PANEL      Result Value Ref Range   Sodium 140  137 -  147 mEq/L   Potassium 3.7  3.7 - 5.3 mEq/L   Chloride 102  96 - 112 mEq/L   CO2 23  19 - 32 mEq/L   Glucose, Bld 104 (*) 70 - 99 mg/dL   BUN 14  6 - 23 mg/dL   Creatinine, Ser 1.06  0.50 - 1.10 mg/dL   Calcium 9.5  8.4 - 10.5 mg/dL   Total Protein 7.8  6.0 - 8.3 g/dL   Albumin 4.1  3.5 - 5.2 g/dL   AST 11  0 - 37 U/L   ALT 9  0 - 35 U/L   Alkaline Phosphatase 56  39 - 117 U/L   Total Bilirubin 0.4  0.3 - 1.2 mg/dL   GFR calc non Af Amer 60 (*) >90 mL/min   GFR calc Af Amer 70 (*) >90 mL/min  URINALYSIS, ROUTINE W REFLEX MICROSCOPIC      Result Value Ref Range   Color, Urine YELLOW  YELLOW   APPearance HAZY (*) CLEAR   Specific Gravity, Urine 1.023  1.005 - 1.030   pH 6.0  5.0 - 8.0   Glucose, UA NEGATIVE  NEGATIVE mg/dL   Hgb urine dipstick NEGATIVE  NEGATIVE   Bilirubin Urine NEGATIVE  NEGATIVE   Ketones, ur NEGATIVE  NEGATIVE mg/dL   Protein, ur NEGATIVE  NEGATIVE mg/dL   Urobilinogen, UA 0.2  0.0 - 1.0 mg/dL   Nitrite NEGATIVE  NEGATIVE   Leukocytes, UA NEGATIVE  NEGATIVE   LIPASE, BLOOD      Result Value Ref Range   Lipase 18  11 - 59 U/L  TROPONIN I      Result Value Ref Range   Troponin I <0.30  <0.30 ng/mL  POC URINE PREG, ED      Result Value Ref Range   Preg Test, Ur NEGATIVE  NEGATIVE    Labs Review Labs Reviewed  RAPID STREP SCREEN - Abnormal; Notable for the following:    Streptococcus, Group A Screen (Direct) POSITIVE (*)    All other components within normal limits  CBC WITH DIFFERENTIAL - Abnormal; Notable for the following:    WBC 17.8 (*)    Neutrophils Relative % 81 (*)    Neutro Abs 14.3 (*)    All other components within normal limits  COMPREHENSIVE METABOLIC PANEL - Abnormal; Notable for the following:    Glucose, Bld 104 (*)    GFR calc non Af Amer 60 (*)    GFR calc Af Amer 70 (*)    All other components within normal limits  URINALYSIS, ROUTINE W REFLEX MICROSCOPIC - Abnormal; Notable for the following:    APPearance HAZY (*)    All other components within normal limits  LIPASE, BLOOD  TROPONIN I  POC URINE PREG, ED    Imaging Review Dg Chest 2 View  06/30/2013   CLINICAL DATA:  Cough and sore throat.  EXAM: CHEST  2 VIEW  COMPARISON:  04/13/2010.  FINDINGS: The cardiac silhouette, mediastinal and hilar contours are within normal limits and stable. There are chronic bronchitic changes, likely related to smoking. No infiltrates, edema or effusions. The bony thorax is intact.  IMPRESSION: Chronic bronchitic type changes, likely related to smoking.  No acute pulmonary findings.   Electronically Signed   By: Kalman Jewels M.D.   On: 06/30/2013 13:08   Ct Soft Tissue Neck W Contrast  06/30/2013   CLINICAL DATA:  Neck pain and swelling  EXAM: CT NECK WITH CONTRAST  TECHNIQUE: Multidetector CT  imaging of the neck was performed using the standard protocol following the bolus administration of intravenous contrast.  CONTRAST:  42m OMNIPAQUE IOHEXOL 300 MG/ML  SOLN  COMPARISON:  None.  FINDINGS: The skull base and its contents are  within normal limits. The parotid and submandibular glands are unremarkable. Visualized paranasal sinuses as well as the orbits are unremarkable. Carotid arteries are widely patent bilaterally. No significant calcific disease is seen. The thyroid is unremarkable. The visualized portions of the thoracic inlet as well as the lung apices show no focal abnormality. Symmetrical musculature is noted.  In the region of the tonsil on the right, there is a peripherally enhancing area with central decreased attenuation which measures at least 2.9 x 2.9 cm and is best seen on image number 48 of series 2. The central decreased attenuation likely represents a degree of necrosis in this may represent a focal peritonsillar abscess. The possibility of necrotic neoplasm cannot be totally excluded. Multiple lymph nodes are identified bilaterally. The largest of these is noted on the right hand the bifurcation of the carotid artery which measures 13 mm in short axis. It measures approximately 24 mm in craniocaudad projection.  No acute bony abnormality is seen.  IMPRESSION: Changes in the right tonsillar/ peritonsillar region as described above. Although this may represent a peritonsillar abscess, the possibility of underlying neoplasm would deserve consideration as well. Close followup is recommended. Direct visualization may be helpful.   Electronically Signed   By: MInez CatalinaM.D.   On: 06/30/2013 15:21     EKG Interpretation   Date/Time:  Thursday June 30 2013 13:31:11 EDT Ventricular Rate:  92 PR Interval:  129 QRS Duration: 85 QT Interval:  327 QTC Calculation: 404 R Axis:   55 Text Interpretation:  Sinus rhythm EKG WITHIN NORMAL LIMITS Confirmed by  DTawnya Crook MD, MEGAN [phone] on 06/30/2013 1:37:46 PM      MDM   Final diagnoses:  Tonsillitis   Medications  doxycycline (VIBRAMYCIN) 100 mg in dextrose 5 % 250 mL IVPB (not administered)  lactobacillus acidophilus (BACID) tablet 2 tablet (not administered)   dexamethasone (DECADRON) injection 10 mg (not administered)  chlorhexidine (PERIDEX) 0.12 % solution 15 mL (not administered)  clindamycin (CLEOCIN) IVPB 600 mg (not administered)  sodium chloride 0.9 % bolus 1,000 mL (0 mLs Intravenous Stopped 06/30/13 1408)  dexamethasone (DECADRON) injection 10 mg (10 mg Intravenous Given 06/30/13 1342)  iohexol (OMNIPAQUE) 300 MG/ML solution 75 mL (75 mLs Intravenous Contrast Given 06/30/13 1458)  sodium chloride 0.9 % bolus 1,000 mL (1,000 mLs Intravenous New Bag/Given 06/30/13 1643)   Filed Vitals:   06/30/13 1520 06/30/13 1530 06/30/13 1600 06/30/13 1602  BP: 143/83 127/53 130/80 130/80  Pulse: 91 93 88 94  Temp:    99.5 F (37.5 C)  TempSrc:    Oral  Resp:    20  Height:      Weight:      SpO2: 99% 98% 99% 99%    EKG noted normal sinus rhythm with a heart rate of 92 beats per minute. Troponin negative elevation. CBC noted elevated white blood cell count of 17.8 with elevated neutrophils of 14.3. CMP noted kidneys and liver functioning well. Negative elevated alkaline phosphatase her bilirubin noted. Lipase negative elevation. Rapid strep test positive. Urinalysis negative for nitrites or leukocytes-negative findings hemoglobin. Negative signs of infection. Urine pregnancy negative. Chest x-ray noted chronic bronchitic changes likely related to smoking, no acute cardiopulmonary disease identified. CT soft tissue with neck noted in  the region of the right tonsil there is a peripherally enhancing area with central decreased attenuation measuring 2.9 cm x 2.9 cm - the central decreased attenuation likely represents necrosis or possible peritonsillar abscess. Necrotic neoplasm cannot be excluded.  This provider spoke with Dr. Simeon Craft, ENT, who recommended patient to be admitted to Hospitalist. As per ENT physician, does not believe to be peritonsillar abscess or necrotic neoplasm - patient refused surgery. Physician recommended patient to be admitted to the  hospital for IV antibiotics and steroids. Reported that once patient is discharged can follow-up in his office for possible tonsillectomy.  Patient started on IV fluids and IV antibiotics while in ED setting. Doubt cholecystitis/cholangitis - LFTs and alk phos negative elevation. Doubt pancreatitis. Discussed case with [facility] - patient to be admitted as inpatient to [place] floor for IV antibiotics for tonsillitis. Patient is not septic appearing. Patient stable for transfer. Discussed plan for admission with patient who agreed to plan of care and understood.   Jamse Mead, PA-C 06/30/13 2052  Jamse Mead, PA-C 07/01/13 1227

## 2013-06-30 NOTE — ED Notes (Signed)
Pt has arrived on pod c to await admission

## 2013-06-30 NOTE — Progress Notes (Signed)
Pt arrived to room from ED.  VS stable and pt oriented to room and call bell.  MD notified of pt's arrival to floor.  Pt requesting to eat and MD gave new order for clear liquids.  Sydney Harris

## 2013-06-30 NOTE — H&P (Signed)
[facility] History and Physical  Sydney Harris TDD:[id number] DOB: 27-Sep-1965 DOA: 06/30/2013  Referring physician: EDP PCP: No PCP Per Patient   Chief Complaint:   HPI: Sydney Harris is a 49 y.o. female is no significant medical history who presents with complaints of a cough, fever, sore throat/pain with swallowing x1 week. She denies any sick contacts. She reports that because of the worsening sore throat she came to the ED today. In the ED  the rapid strep test was done and was positive for strep A. CT soft tissue neck was done and showed changes in the right tonsillar/peritonsillar region-likely representing a degree of necrosis versus peritonsillar abscess although possibility of necrotic neoplasm cannot be excluded. A white cell count was elevated at 17.8 , ENT was consulted and patient was seen by Dr. Simeon Craft and recommended tonsillectomy the patient declined. Dr. Simeon Craft also recommended antibiotics with clindamycin and doxycycline along with steroids. She is admitted for further evaluation and management. Patient denies shortness of breath, no drooling and hemodynamically stable in EDP.   Review of Systems The patient denies anorexia, fever, weight loss,, vision loss, decreased hearing, hoarseness, chest pain, syncope, dyspnea on exertion, peripheral edema, balance deficits, hemoptysis, abdominal pain, melena, hematochezia, severe indigestion/heartburn, hematuria, incontinence, genital sores, muscle weakness, suspicious skin lesions, transient blindness, difficulty walking, depression, unusual weight change.   Past Medical History  Diagnosis Date  . Blood transfusion   . Fibroid tumor    Past Surgical History  Procedure Laterality Date  . Abdominal surgery     Social History:  reports that she has been smoking.  She does not have any smokeless tobacco history on file. She reports that she drinks alcohol. She reports that she does not use illicit drugs.  Allergies  Allergen  Reactions  . Penicillins Hives    History reviewed. No pertinent family history.   Prior to Admission medications   Medication Sig Start Date End Date Taking? Authorizing Provider  ibuprofen (ADVIL) 200 MG tablet Take 200 mg by mouth once.   Yes Historical Provider, MD   Physical Exam: Filed Vitals:   06/30/13 1808  BP: 138/78  Pulse: 86  Temp: 99.7 F (37.6 C)  Resp: 18    BP 138/78  Pulse 86  Temp(Src) 99.7 F (37.6 C) (Oral)  Resp 18  Ht 5\' 3"  (1.6 m)  Wt 90.719 kg (200 lb)  BMI 35.44 kg/m2  SpO2 99%  LMP 12/28/2010 Constitutional: Vital signs reviewed.  Patient is a well-developed and well-nourished  in no acute distress and cooperative with exam. Alert and oriented x3.  Head: Normocephalic and atraumatic Mouth: Tonsils enlarged bilaterally with whitish exudates greater on the right. Eyes: PERRL, EOMI, conjunctivae normal, No scleral icterus.  Neck: Supple, Trachea midline normal ROM, No JVD, mass, thyromegaly, or carotid bruit present.  Cardiovascular: RRR, S1 normal, S2 normal, no MRG, pulses symmetric and intact bilaterally Pulmonary/Chest: normal respiratory effort, CTAB, no wheezes, rales, or rhonchi Abdominal: Soft. Non-tender, non-distended, bowel sounds are normal, no masses, organomegaly, or guarding present.  GU: no CVA tenderness  extremities: No cyanosis and no edema  Neurological: A&O x3, Strength is normal and symmetric bilaterally, cranial nerve II-XII are grossly intact, no focal motor deficit, sensory intact to light touch bilaterally.  Skin: Warm, dry and intact. No rash, cyanosis, or clubbing.  Psychiatric: Normal mood and affect. speech and behavior is normal.              Labs on Admission:  Basic Metabolic  Panel:  Recent Labs Lab 06/30/13 1131  NA 140  K 3.7  CL 102  CO2 23  GLUCOSE 104*  BUN 14  CREATININE 1.06  CALCIUM 9.5   Liver Function Tests:  Recent Labs Lab 06/30/13 1131  AST 11  ALT 9  ALKPHOS 56  BILITOT 0.4   PROT 7.8  ALBUMIN 4.1    Recent Labs Lab 06/30/13 1145  LIPASE 18   No results found for this basename: AMMONIA,  in the last 168 hours CBC:  Recent Labs Lab 06/30/13 1131  WBC 17.8*  NEUTROABS 14.3*  HGB 14.2  HCT 42.7  MCV 88.2  PLT 320   Cardiac Enzymes:  Recent Labs Lab 06/30/13 1233  TROPONINI <0.30    BNP (last 3 results) No results found for this basename: PROBNP,  in the last 8760 hours CBG: No results found for this basename: GLUCAP,  in the last 168 hours  Radiological Exams on Admission: Dg Chest 2 View  06/30/2013   CLINICAL DATA:  Cough and sore throat.  EXAM: CHEST  2 VIEW  COMPARISON:  04/13/2010.  FINDINGS: The cardiac silhouette, mediastinal and hilar contours are within normal limits and stable. There are chronic bronchitic changes, likely related to smoking. No infiltrates, edema or effusions. The bony thorax is intact.  IMPRESSION: Chronic bronchitic type changes, likely related to smoking.  No acute pulmonary findings.   Electronically Signed   By: Kalman Jewels M.D.   On: 06/30/2013 13:08   Ct Soft Tissue Neck W Contrast  06/30/2013   CLINICAL DATA:  Neck pain and swelling  EXAM: CT NECK WITH CONTRAST  TECHNIQUE: Multidetector CT imaging of the neck was performed using the standard protocol following the bolus administration of intravenous contrast.  CONTRAST:  44mL OMNIPAQUE IOHEXOL 300 MG/ML  SOLN  COMPARISON:  None.  FINDINGS: The skull base and its contents are within normal limits. The parotid and submandibular glands are unremarkable. Visualized paranasal sinuses as well as the orbits are unremarkable. Carotid arteries are widely patent bilaterally. No significant calcific disease is seen. The thyroid is unremarkable. The visualized portions of the thoracic inlet as well as the lung apices show no focal abnormality. Symmetrical musculature is noted.  In the region of the tonsil on the right, there is a peripherally enhancing area with central  decreased attenuation which measures at least 2.9 x 2.9 cm and is best seen on image number 48 of series 2. The central decreased attenuation likely represents a degree of necrosis in this may represent a focal peritonsillar abscess. The possibility of necrotic neoplasm cannot be totally excluded. Multiple lymph nodes are identified bilaterally. The largest of these is noted on the right hand the bifurcation of the carotid artery which measures 13 mm in short axis. It measures approximately 24 mm in craniocaudad projection.  No acute bony abnormality is seen.  IMPRESSION: Changes in the right tonsillar/ peritonsillar region as described above. Although this may represent a peritonsillar abscess, the possibility of underlying neoplasm would deserve consideration as well. Close followup is recommended. Direct visualization may be helpful.   Electronically Signed   By: Inez Catalina M.D.   On: 06/30/2013 15:21    EKG: Independently reviewed. No sinus rhythm with no acute ischemic changes, rate of 92.  Assessment/Plan Active Problems:   Tonsillitis/streptococcal infection group A - per Dr. Theressa Millard exam no focal fungating or ulcerative mass noted As discussed above, rapid stress test positive for group A -Will continue empiric antibiotics with doxycycline  and clindamycin as recommended per ENT -We'll also continue dexamethasone IV x3 doses, oral prednisone to be started following that. -Will start on clear liquids for now, follow and advance as tolerated -Please and see Dr. Theressa Millard note for recommendations on discharge meds and outpatient followup when she is medically ready.   Leukocytosis, unspecified -Secondary to above, follow and recheck on antibiotics.     Code Status: Full Family Communication: None at bedside Disposition Plan: Admit to MedSurg  Time spent: 79mins  Reshawn Ostlund C [facility] Pager [phone]

## 2013-06-30 NOTE — Consult Note (Signed)
Sydney Harris, [place] [id number] 02-01-1965 Sydney Ehlers, MD  Reason for Consult: acute streptococcus tonsillitis with possible right peritonsillar abscess vs. necrosis  HPI: 48yo AAF with several days of right acute sore throat and fever. Has elevated white count in ER and rapid strep is positive. Got CT neck with contrast today that I reviewed, shows tonsillar hypertrophy with right central tonsillar hypodensity and some scattered bilateral cervical adenopathy. Patient does smoke. Patient is able to swallow her secretions.  Allergies:  Allergies  Allergen Reactions  . Penicillins Hives    ROS: + for R>L sore throat, dysphagia, otherwise negative x 12 systems except per HPI.  PMH:  Past Medical History  Diagnosis Date  . Blood transfusion   . Fibroid tumor     FH: History reviewed. No pertinent family history.  SH:  History   Social History  . Marital Status: Married    Spouse Name: N/A    Number of Children: N/A  . Years of Education: N/A   Occupational History  . Not on file.   Social History Main Topics  . Smoking status: Current Every Day Smoker  . Smokeless tobacco: Not on file  . Alcohol Use: Yes     Comment: "a little bit"  . Drug Use: No  . Sexual Activity:    Other Topics Concern  . Not on file   Social History Narrative  . No narrative on file    PSH:  Past Surgical History  Procedure Laterality Date  . Abdominal surgery      Physical  Exam: CN 2-12 grossly intact and symmetric. EAC/TMs normal BL. Oral cavity/oropharynx shows a midline uvula with minimal edema and minimal symmetric bilateral soft palate/anterior tonsillar pillar edema. Tonsils are symmetric and Freidman 3+ with bilateral tonsillar exudates and mucosal necrosis consistent with acute necrotic tonsillitis. I don't see any focal fungating or ulcerative masses. Skin warm and dry. Nasal cavity without polyps or purulence. External nose and ears without masses or lesions. EOMI, PERRLA. Neck  supple.    A/P: elevated white count, symmetric tonsils with bilateral exudate, and elevated white count with positive strep test point more to acute severe strep + tonsillitis. I discussed and advised tonsillectomy but the patient flatly refused tonsillectomy OR any needle drainage or I&D procedures on the right tonsil. Since her voice is essentially normal, airway appears patent, and she is handling her secretions, this can likely be managed with antibiotics with steroids, and I did recommend outpatient tonsillectomy in the future to prevent recurrent tonsillitis and repeated ER visits. I recommended admission to Hospitalists for IV antibiotics and IV decadron, and patient can be discharged from ENT standpoint when she is feeling better and able to take sufficient PO. She should go home with Doxycycline and clindamycin and lactobacillus and a prednisone taper, e.g. 40 or 60 mg taper over 7 to 10 days. She can see me back as an outpatient in a few weeks to discuss tonsillectomy.   Ruby Cola 06/30/2013 5:02 PM

## 2013-06-30 NOTE — ED Notes (Signed)
PA at bedside to update patient.

## 2013-06-30 NOTE — ED Notes (Signed)
Pt incontinent of urine, pt ambulatory to bathroom to clean herself, bed linens changed.  Pt tolerated well.

## 2013-06-30 NOTE — Progress Notes (Signed)
ANTIBIOTIC CONSULT NOTE - INITIAL  Pharmacy Consult for Clindamycin Indication: peritonsillar abscess  Allergies  Allergen Reactions  . Penicillins Hives    Patient Measurements: Height: 5\' 3"  (160 cm) Weight: 200 lb (90.719 kg) IBW/kg (Calculated) : 52.4  Vital Signs: Temp: 99.5 F (37.5 C) (06/11 1602) Temp src: Oral (06/11 1602) BP: 130/80 mmHg (06/11 1602) Pulse Rate: 94 (06/11 1602) Labs:  Recent Labs  06/30/13 1131  WBC 17.8*  HGB 14.2  PLT 320  CREATININE 1.06   Estimated Creatinine Clearance: 67.9 ml/min (by C-G formula based on Cr of 1.06).  Microbiology: Recent Results (from the past 720 hour(s))  RAPID STREP SCREEN     Status: Abnormal   Collection Time    06/30/13 11:56 AM      Result Value Ref Range Status   Streptococcus, Group A Screen (Direct) POSITIVE (*) NEGATIVE Final    Medical History: Past Medical History  Diagnosis Date  . Blood transfusion   . Fibroid tumor    Assessment: 64 YOF with peritonsillar abscess + for group A streptococcus to start Clindamycin. CrCl ~60-66mL/min. PCN allergy. WBC 17.8. Tmax 99.9, NPO for now.   Goal of Therapy:  Clinical resolution of infection  Plan:  Clindamycin 600mg  IV q8h. Recommended duration is 10 days.  Follow-up renal function, clinical status, and cultures.   Sloan Leiter, PharmD, BCPS Clinical Pharmacist [phone] 06/30/2013,4:35 PM

## 2013-06-30 NOTE — ED Notes (Signed)
PA aware of patients request for pain medication, states she will review test results and decide on further treatment

## 2013-06-30 NOTE — ED Notes (Signed)
Pt presents with 1 week h/o generalized body aches, fever and sore throat.  +nasal congestion and cough

## 2013-07-01 LAB — BASIC METABOLIC PANEL
BUN: 10 mg/dL (ref 6–23)
CO2: 17 mEq/L — ABNORMAL LOW (ref 19–32)
Calcium: 9.2 mg/dL (ref 8.4–10.5)
Chloride: 104 mEq/L (ref 96–112)
Creatinine, Ser: 0.78 mg/dL (ref 0.50–1.10)
GLUCOSE: 161 mg/dL — AB (ref 70–99)
POTASSIUM: 4.1 meq/L (ref 3.7–5.3)
SODIUM: 138 meq/L (ref 137–147)

## 2013-07-01 LAB — CBC
HCT: 41.3 % (ref 36.0–46.0)
HEMOGLOBIN: 13.6 g/dL (ref 12.0–15.0)
MCH: 28.9 pg (ref 26.0–34.0)
MCHC: 32.9 g/dL (ref 30.0–36.0)
MCV: 87.7 fL (ref 78.0–100.0)
PLATELETS: 308 10*3/uL (ref 150–400)
RBC: 4.71 MIL/uL (ref 3.87–5.11)
RDW: 14.9 % (ref 11.5–15.5)
WBC: 23.2 10*3/uL — ABNORMAL HIGH (ref 4.0–10.5)

## 2013-07-01 NOTE — Progress Notes (Signed)
PROGRESS NOTE  Sydney Harris [facility]:[id number] DOB: Dec 19, 1965 DOA: 06/30/2013 PCP: No PCP Per Patient  Assessment/Plan: Acute tonsillitis/peritonsillar abscess -Most likely group A streptococcus -Patient refuses tonsillectomy -Appreciate ENT followup -Continue clindamycin -Continue steroids per ENT recommendations -Clinically improving -Able tolerate diet today Tobacco abuse -Tobacco cessation discussed Leukocytosis -Partly due to demargination from steroids -Continue to monitor    Family Communication:   Pt at beside Disposition Plan:   Home 6/13 if medically stable       Procedures/Studies: Dg Chest 2 View  06/30/2013   CLINICAL DATA:  Cough and sore throat.  EXAM: CHEST  2 VIEW  COMPARISON:  04/13/2010.  FINDINGS: The cardiac silhouette, mediastinal and hilar contours are within normal limits and stable. There are chronic bronchitic changes, likely related to smoking. No infiltrates, edema or effusions. The bony thorax is intact.  IMPRESSION: Chronic bronchitic type changes, likely related to smoking.  No acute pulmonary findings.   Electronically Signed   By: Kalman Jewels M.D.   On: 06/30/2013 13:08   Ct Soft Tissue Neck W Contrast  06/30/2013   CLINICAL DATA:  Neck pain and swelling  EXAM: CT NECK WITH CONTRAST  TECHNIQUE: Multidetector CT imaging of the neck was performed using the standard protocol following the bolus administration of intravenous contrast.  CONTRAST:  16mL OMNIPAQUE IOHEXOL 300 MG/ML  SOLN  COMPARISON:  None.  FINDINGS: The skull base and its contents are within normal limits. The parotid and submandibular glands are unremarkable. Visualized paranasal sinuses as well as the orbits are unremarkable. Carotid arteries are widely patent bilaterally. No significant calcific disease is seen. The thyroid is unremarkable. The visualized portions of the thoracic inlet as well as the lung apices show no focal abnormality. Symmetrical musculature is  noted.  In the region of the tonsil on the right, there is a peripherally enhancing area with central decreased attenuation which measures at least 2.9 x 2.9 cm and is best seen on image number 48 of series 2. The central decreased attenuation likely represents a degree of necrosis in this may represent a focal peritonsillar abscess. The possibility of necrotic neoplasm cannot be totally excluded. Multiple lymph nodes are identified bilaterally. The largest of these is noted on the right hand the bifurcation of the carotid artery which measures 13 mm in short axis. It measures approximately 24 mm in craniocaudad projection.  No acute bony abnormality is seen.  IMPRESSION: Changes in the right tonsillar/ peritonsillar region as described above. Although this may represent a peritonsillar abscess, the possibility of underlying neoplasm would deserve consideration as well. Close followup is recommended. Direct visualization may be helpful.   Electronically Signed   By: Inez Catalina M.D.   On: 06/30/2013 15:21         Subjective: Patient states that she is to have significant neck pain and throat pain but it is improving. She is able to swallow and eat it. Denies any drooling. Denies any fevers, chills, chest pain, shortness breath, vomiting, diarrhea, abdominal pain.  Objective: Filed Vitals:   06/30/13 1808 06/30/13 2122 07/01/13 0459 07/01/13 1313  BP: 138/78 139/83 126/79 122/76  Pulse: 86 78 89 82  Temp: 99.7 F (37.6 C) 98 F (36.7 C) 98.6 F (37 C) 98.4 F (36.9 C)  TempSrc: Oral Oral Oral Oral  Resp: 18 20 20 18   Height:      Weight:      SpO2: 99% 97% 97% 98%  Intake/Output Summary (Last 24 hours) at 07/01/13 1857 Last data filed at 07/01/13 1400  Gross per 24 hour  Intake 2498.75 ml  Output   1000 ml  Net 1498.75 ml   Weight change:  Exam:   General:  Pt is alert, follows commands appropriately, not in acute distress  HEENT: No icterus, No thrush,  [place]/AT; bilateral  shotty cervical adenopathy with tonsillar exudates noted.  Cardiovascular: RRR, S1/S2, no rubs, no gallops  Respiratory: CTA bilaterally, no wheezing, no crackles, no rhonchi  Abdomen: Soft/+BS, non tender, non distended, no guarding  Extremities: No edema, No lymphangitis, No petechiae, No rashes, no synovitis  Data Reviewed: Basic Metabolic Panel:  Recent Labs Lab 06/30/13 1131 07/01/13 0545  NA 140 138  K 3.7 4.1  CL 102 104  CO2 23 17*  GLUCOSE 104* 161*  BUN 14 10  CREATININE 1.06 0.78  CALCIUM 9.5 9.2   Liver Function Tests:  Recent Labs Lab 06/30/13 1131  AST 11  ALT 9  ALKPHOS 56  BILITOT 0.4  PROT 7.8  ALBUMIN 4.1    Recent Labs Lab 06/30/13 1145  LIPASE 18   No results found for this basename: AMMONIA,  in the last 168 hours CBC:  Recent Labs Lab 06/30/13 1131 07/01/13 0545  WBC 17.8* 23.2*  NEUTROABS 14.3*  --   HGB 14.2 13.6  HCT 42.7 41.3  MCV 88.2 87.7  PLT 320 308   Cardiac Enzymes:  Recent Labs Lab 06/30/13 1233  TROPONINI <0.30   BNP: No components found with this basename: POCBNP,  CBG: No results found for this basename: GLUCAP,  in the last 168 hours  Recent Results (from the past 240 hour(s))  RAPID STREP SCREEN     Status: Abnormal   Collection Time    06/30/13 11:56 AM      Result Value Ref Range Status   Streptococcus, Group A Screen (Direct) POSITIVE (*) NEGATIVE Final     Scheduled Meds: . chlorhexidine  15 mL Mouth/Throat QID  . clindamycin (CLEOCIN) IV  600 mg Intravenous 4 times per day  . doxycycline (VIBRAMYCIN) IV  100 mg Intravenous Q12H  . enoxaparin (LOVENOX) injection  40 mg Subcutaneous Q24H  . lactobacillus acidophilus  2 tablet Oral TID   Continuous Infusions: . sodium chloride 75 mL/hr at 07/01/13 1651     Indiana Gamero, DO  [facility] Pager [phone]  If 7PM-7AM, please contact night-coverage [email or web address] Password [facility] 07/01/2013, 6:57 PM   LOS: 1 day

## 2013-07-01 NOTE — ED Provider Notes (Signed)
Medical screening examination/treatment/procedure(s) were conducted as a shared visit with non-physician practitioner(s) and myself.  I personally evaluated the patient during the encounter. Pt presents w/ multiple complaints, worst being sore throat. She has R>l tonsillar enlargement & exudates, but no trismus, no resp distress, midline uvula. +strep. CT neck w/ likely PTA. Pt seen by ENT, offered surgical I&D which pt has declined. She will be admitted to medicine for steroids, IV abx.    EKG Interpretation   Date/Time:  Thursday June 30 2013 13:31:11 EDT Ventricular Rate:  92 PR Interval:  129 QRS Duration: 85 QT Interval:  327 QTC Calculation: 404 R Axis:   55 Text Interpretation:  Sinus rhythm EKG WITHIN NORMAL LIMITS Confirmed by  Tawnya Crook  MD, Maleigh Bagot [phone]) on 06/30/2013 1:37:46 PM        [name], MD 07/01/13 1232

## 2013-07-01 NOTE — Progress Notes (Signed)
Utilization review completed. Kerrilynn Derenzo, RN, BSN. 

## 2013-07-01 NOTE — Progress Notes (Signed)
Subjective: Sydney Harris admitted for acute severe strep tonsillitis. Still complains of right sore throat but able to swallow clear liquids and her secretions and wants to eat. WBC increased this morning.  Objective: Vital signs in last 24 hours: Temp:  [98 F (36.7 C)-101.1 F (38.4 C)] 98.6 F (37 C) (06/12 0459) Pulse Rate:  [78-94] 89 (06/12 0459) Resp:  [15-22] 20 (06/12 0459) BP: (114-143)/(53-83) 126/79 mmHg (06/12 0459) SpO2:  [97 %-99 %] 97 % (06/12 0459) Weight:  [90.719 kg (200 lb)] 90.719 kg (200 lb) (06/11 1126)  No stridor or stertor, oral cavity shows a midline uvula with some erythema around the tonsillar pillars bilaterally. Freidman 3+ tonsils bilaterally with some white exudate over the surface of the right tonsil and erythema of the left tonsil. Mild edema but no bulging of the tonsillar pillars.  @LABLAST2 (wbc:2,hgb:2,hct:2,plt:2)  Recent Labs  06/30/13 1131 07/01/13 0545  NA 140 138  K 3.7 4.1  CL 102 104  CO2 23 17*  GLUCOSE 104* 161*  BUN 14 10  CREATININE 1.06 0.78  CALCIUM 9.5 9.2    Medications: Current facility-administered medications:0.9 %  sodium chloride infusion, , Intravenous, Continuous, Adeline C Viyuoh, MD, Last Rate: 75 mL/hr at 06/30/13 2041;  acetaminophen (TYLENOL) suppository 650 mg, 650 mg, Rectal, Q6H PRN, Adeline C Viyuoh, MD;  acetaminophen (TYLENOL) tablet 650 mg, 650 mg, Oral, Q6H PRN, Adeline C Viyuoh, MD, 650 mg at 07/01/13 0651 chlorhexidine (PERIDEX) 0.12 % solution 15 mL, 15 mL, Mouth/Throat, QID, Ruby Cola, MD, 15 mL at 06/30/13 2314;  clindamycin (CLEOCIN) IVPB 600 mg, 600 mg, Intravenous, 4 times per day, Ruby Cola, MD, 600 mg at 07/01/13 0604;  dexamethasone (DECADRON) injection 10 mg, 10 mg, Intravenous, 3 times per day, Ruby Cola, MD, 10 mg at 07/01/13 2878 doxycycline (VIBRAMYCIN) 100 mg in dextrose 5 % 250 mL IVPB, 100 mg, Intravenous, Q12H, Ruby Cola, MD, 100 mg at 06/30/13 2044;  enoxaparin (LOVENOX) injection  40 mg, 40 mg, Subcutaneous, Q24H, Adeline C Viyuoh, MD, 40 mg at 06/30/13 2044;  lactobacillus acidophilus (BACID) tablet 2 tablet, 2 tablet, Oral, TID, Ruby Cola, MD, 2 tablet at 06/30/13 2323;  morphine 2 MG/ML injection 1 mg, 1 mg, Intravenous, Q4H PRN, Adeline C Viyuoh, MD ondansetron (ZOFRAN) injection 4 mg, 4 mg, Intravenous, Q6H PRN, Adeline C Viyuoh, MD;  ondansetron (ZOFRAN) tablet 4 mg, 4 mg, Oral, Q6H PRN, Adeline C Viyuoh, MD  Assessment/Plan: Increased WBC may be due to margination from the Decadron. Stable with acute severe strep tonsillitis. Patient refused tonsillectomy. Would continue medical management with IV Doxycycline and Clindamycin with probiotic and IV decadron at least another day. If feels stable to better tomorrow can likely go home on oral clindamycin/doxycycline/lactobacillus with a prednisone or prednisolone taper and chlorhexidene oral rinse, and follow up with ENT as an outpatient to discuss tonsillectomy. I wrote to advance her diet as tolerated.   LOS: 1 day   Ruby Cola 07/01/2013, 7:07 AM

## 2013-07-02 LAB — BASIC METABOLIC PANEL
BUN: 14 mg/dL (ref 6–23)
CHLORIDE: 105 meq/L (ref 96–112)
CO2: 20 meq/L (ref 19–32)
Calcium: 9.1 mg/dL (ref 8.4–10.5)
Creatinine, Ser: 0.81 mg/dL (ref 0.50–1.10)
GFR calc Af Amer: >90 mL/min (ref >90)
GFR calc non Af Amer: 83 mL/min — ABNORMAL LOW (ref >90)
Glucose, Bld: 131 mg/dL — ABNORMAL HIGH (ref 70–99)
Potassium: 3.9 mEq/L (ref 3.7–5.3)
SODIUM: 142 meq/L (ref 137–147)

## 2013-07-02 LAB — CBC WITH DIFFERENTIAL/PLATELET
Basophils Absolute: 0 10*3/uL (ref 0.0–0.1)
Basophils Relative: 0 % (ref 0–1)
EOS PCT: 0 % (ref 0–5)
Eosinophils Absolute: 0 10*3/uL (ref 0.0–0.7)
HCT: 38.6 % (ref 36.0–46.0)
Hemoglobin: 12.7 g/dL (ref 12.0–15.0)
LYMPHS PCT: 7 % — AB (ref 12–46)
Lymphs Abs: 1.9 10*3/uL (ref 0.7–4.0)
MCH: 28.8 pg (ref 26.0–34.0)
MCHC: 32.9 g/dL (ref 30.0–36.0)
MCV: 87.5 fL (ref 78.0–100.0)
MONOS PCT: 5 % (ref 3–12)
Monocytes Absolute: 1.4 10*3/uL — ABNORMAL HIGH (ref 0.1–1.0)
NEUTROS PCT: 88 % — AB (ref 43–77)
Neutro Abs: 24.1 10*3/uL — ABNORMAL HIGH (ref 1.7–7.7)
Platelets: 328 10*3/uL (ref 150–400)
RBC: 4.41 MIL/uL (ref 3.87–5.11)
RDW: 14.8 % (ref 11.5–15.5)
WBC: 27.4 10*3/uL — AB (ref 4.0–10.5)

## 2013-07-02 LAB — HIV ANTIBODY (ROUTINE TESTING W REFLEX): HIV: NONREACTIVE

## 2013-07-02 MED ORDER — PREDNISONE 50 MG PO TABS
60.0000 mg | ORAL_TABLET | Freq: Every day | ORAL | Status: DC
  Administered 2013-07-02: 60 mg via ORAL
  Filled 2013-07-02 (×2): qty 1

## 2013-07-02 MED ORDER — PREDNISONE 10 MG PO TABS: 50.0000 mg | ORAL_TABLET | Freq: Every day | ORAL | Status: DC

## 2013-07-02 MED ORDER — DOXYCYCLINE HYCLATE 100 MG PO TABS: 100.0000 mg | ORAL_TABLET | Freq: Two times a day (BID) | ORAL | Status: DC

## 2013-07-02 MED ORDER — CLINDAMYCIN HCL 300 MG PO CAPS: 300.0000 mg | ORAL_CAPSULE | Freq: Three times a day (TID) | ORAL | Status: DC

## 2013-07-02 MED ORDER — QUETIAPINE FUMARATE 200 MG PO TABS
200.0000 mg | ORAL_TABLET | Freq: Every day | ORAL | Status: DC
  Filled 2013-07-02: qty 1

## 2013-07-02 MED ORDER — BACID PO TABS: 2.0000 | ORAL_TABLET | Freq: Three times a day (TID) | ORAL | Status: DC

## 2013-07-02 MED ORDER — CLINDAMYCIN HCL 300 MG PO CAPS
300.0000 mg | ORAL_CAPSULE | Freq: Three times a day (TID) | ORAL | Status: DC
  Administered 2013-07-02: 300 mg via ORAL
  Filled 2013-07-02: qty 1

## 2013-07-02 NOTE — Progress Notes (Signed)
Pt states she needs some bus passes.  Mel Almond called and will be up.

## 2013-07-02 NOTE — Progress Notes (Signed)
Pt ready for discharge to home accomp by family.  Pt has Rx and Match letter for Rx at the [facility] on [address].  Pt understands to [facility] on Monday for follow up.

## 2013-07-02 NOTE — Care Management Note (Addendum)
  Page 1 of 1   07/02/2013     10:56:28 AM CARE MANAGEMENT NOTE 07/02/2013  Patient:  Sydney Harris, Sydney Harris   Account Number:  [id number]  Date Initiated:  07/02/2013  Documentation initiated by:  GRAVES-BIGELOW,Joakim Huesman  Subjective/Objective Assessment:   Pt admitted for complaints of a cough, fever, sore throat/pain with swallowing x1 week. Pt will ned to call to set up PCP appointment as soon as possible.     Action/Plan:   Plans for d/c today. No PCP. CM did leave VM with [facility]. Pt without insurance. [facility] program utilized and will get meds with fee override- no co pay at this time. Pt will need a 34 day Rx for each medication. Match will not be utilized for lactobacillus acidophilus Tabs tablet. Thanks   Anticipated DC Date:  07/02/2013   Anticipated DC Plan:  [place]  Medication Assistance  MATCH Program  CM consult  [facility]      Choice offered to / List presented to:             Status of service:   Medicare Important Message given?  NO (If response is "NO", the following Medicare IM given date fields will be blank) Date Medicare IM given:   Date Additional Medicare IM given:    Discharge Disposition:  HOME/SELF CARE  Per UR Regulation:  Reviewed for med. necessity/level of care/duration of stay  If discussed at [place] of Stay Meetings, dates discussed:    Comments:

## 2013-07-02 NOTE — Progress Notes (Signed)
Clinical Education officer, museum (CSW) received call from RN stating that patient needs bus passes to the pharmacy and home. Per RN patient is medically stable to ride the bus. CSW met with patient and gave her 2 bus passes, 1 to get to the pharmacy and 1 to get home. Patient reported that she did not have any money and is thankful for CSW giving bus passes. Patient reported that she is familiar with the [place] bus routes and knows where the nearest bus stop is. Please reconsult if further social work needs arise. CSW signing off.   Blima Rich, [place] Weekend CSW [phone]

## 2013-07-02 NOTE — Discharge Summary (Signed)
Physician Discharge Summary  Sydney Harris GEX:[id number] DOB: May 19, 1965 DOA: 06/30/2013  PCP: No PCP Per Patient  Admit date: 06/30/2013 Discharge date: 07/02/2013  Recommendations for Outpatient Follow-up:  1. Pt will need to follow up with PCP in 2 weeks post discharge 2. Please obtain BMP  3. Please also check CBC  Discharge Diagnoses:  Active Problems:   Tonsillitis   Streptococcal infection group A   Leukocytosis, unspecified Acute tonsillitis/peritonsillar abscess  -group A streptococcus--rapid strept test positive  -Patient refuses tonsillectomy  -Appreciate ENT followup--Dr. Simeon Craft  -Continue clindamycin  -Continue steroids per ENT recommendations  -Clinically improving--Although the patient had some pain with swallowing, she was able to tolerate her diet. There was no drooling, or respiratory issues  -Able to tolerate regular diet -Patient was switched to oral clindamycin and doxycycline for 9 additional days which will complete 10 days of therapy  -pt unable to afford her meds--discussed with case manager whom assisted in obtaining the medications Tobacco abuse  -Tobacco cessation discussed  Leukocytosis  -Partly due to demargination from steroids  -Continue to monitor -Per ENT recommendations, the patient will go home on a prednisone taper  Bipolar disorder -continue home seroquel 200mg  po qhs Discharge Condition: stable  Disposition:  Follow-up Information   Follow up with Ruby Cola, MD In 1 week.   Specialty:  Otolaryngology   Contact information:   [address] [place] [address] [phone]     home  Diet:regular Wt Readings from Last 3 Encounters:  06/30/13 90.719 kg (200 lb)    History of present illness:  48 year old female with past medical history of blood transfusion and fibroid tumors presenting to the ED with generalized bodyaches, productive cough, nasal congestion, sore throat and painfull swallowing with neck pain  that has been ongoing for approximately one week. Patient reports that she has soreness when swallowing. Reported that when she coughs she produces a thick clear/whitish sputum. Stated that she's been having fevers intermittently throughout the course of the week-subjective. Reported that she started to experience a headache a couple of days ago described as a throbbing sensation localized to the right side of her head.  Patient has been using Aleve with minimal relief. Denied using any other over-the-counter medications. Patient reports she smokes approximately 2-3 cigarettes per day. Denied nausea, vomiting, diarrhea, melena, hematochezia, difficulty swallowing, blurred vision, sudden loss of vision, neck pain, neck stiffness, ear pain, sick contacts. CT of the neck soft tissues was obtained which revealed tonsillitis with possible peritonsillar abscess. ENT was consulted. Dr. Simeon Craft saw the patient. He recommended tonsillectomy. The patient did not want any surgery and continued to refuse throughout the hospitalization. He recommended treating the patient with antibiotics. The patient received 2 days of intravenous antibiotics and clinically improved. She was able to tolerate her diet with minimal difficulty. She was discharged home with oral antibiotics to finish a ten-day course. She was instructed to follow up with ENT in one week.       Consultants: ENT--GORE  Discharge Exam: Filed Vitals:   07/02/13 0522  BP: 130/80  Pulse: 70  Temp: 97.6 F (36.4 C)  Resp: 20   Filed Vitals:   07/01/13 0459 07/01/13 1313 07/01/13 2122 07/02/13 0522  BP: 126/79 122/76 140/90 130/80  Pulse: 89 82 73 70  Temp: 98.6 F (37 C) 98.4 F (36.9 C) 97.4 F (36.3 C) 97.6 F (36.4 C)  TempSrc: Oral Oral Axillary Oral  Resp: 20 18 20 20   Height:  Weight:      SpO2: 97% 98% 98% 99%   General: A&O x 3, NAD, pleasant, cooperative Cardiovascular: RRR, no rub, no gallop, no S3 Respiratory: CTAB, no wheeze,  no rhonchi Abdomen:soft, nontender, nondistended, positive bowel sounds Extremities: No edema, No lymphangitis, no petechiae  Discharge Instructions      Discharge Instructions   Diet - low sodium heart healthy    Complete by:  As directed      Discharge instructions    Complete by:  As directed   Prednisone 50mg --take 5 tablets once daily starting 07/03/13 take 4 tablets once daily starting 07/04/13 take 3 tablets once daily starting 07/05/13 take 2 tablets once daily starting 07/06/13 take 1 tablets once daily starting 07/07/13     Increase activity slowly    Complete by:  As directed             Medication List         ADVIL 200 MG tablet  Generic drug:  ibuprofen  Take 200 mg by mouth once.     clindamycin 300 MG capsule  Commonly known as:  CLEOCIN  Take 1 capsule (300 mg total) by mouth every 8 (eight) hours.     doxycycline 100 MG tablet  Commonly known as:  VIBRA-TABS  Take 1 tablet (100 mg total) by mouth every 12 (twelve) hours.     lactobacillus acidophilus Tabs tablet  Take 2 tablets by mouth 3 (three) times daily.     predniSONE 10 MG tablet  Commonly known as:  DELTASONE  Take 5 tablets (50 mg total) by mouth daily with breakfast. Decrease by one tablet daily until gone--start 07/03/13  Start taking on:  07/03/2013         The results of significant diagnostics from this hospitalization (including imaging, microbiology, ancillary and laboratory) are listed below for reference.    Significant Diagnostic Studies: Dg Chest 2 View  06/30/2013   CLINICAL DATA:  Cough and sore throat.  EXAM: CHEST  2 VIEW  COMPARISON:  04/13/2010.  FINDINGS: The cardiac silhouette, mediastinal and hilar contours are within normal limits and stable. There are chronic bronchitic changes, likely related to smoking. No infiltrates, edema or effusions. The bony thorax is intact.  IMPRESSION: Chronic bronchitic type changes, likely related to smoking.  No acute pulmonary findings.    Electronically Signed   By: Kalman Jewels M.D.   On: 06/30/2013 13:08   Ct Soft Tissue Neck W Contrast  06/30/2013   CLINICAL DATA:  Neck pain and swelling  EXAM: CT NECK WITH CONTRAST  TECHNIQUE: Multidetector CT imaging of the neck was performed using the standard protocol following the bolus administration of intravenous contrast.  CONTRAST:  68mL OMNIPAQUE IOHEXOL 300 MG/ML  SOLN  COMPARISON:  None.  FINDINGS: The skull base and its contents are within normal limits. The parotid and submandibular glands are unremarkable. Visualized paranasal sinuses as well as the orbits are unremarkable. Carotid arteries are widely patent bilaterally. No significant calcific disease is seen. The thyroid is unremarkable. The visualized portions of the thoracic inlet as well as the lung apices show no focal abnormality. Symmetrical musculature is noted.  In the region of the tonsil on the right, there is a peripherally enhancing area with central decreased attenuation which measures at least 2.9 x 2.9 cm and is best seen on image number 48 of series 2. The central decreased attenuation likely represents a degree of necrosis in this may represent a focal peritonsillar abscess. The  possibility of necrotic neoplasm cannot be totally excluded. Multiple lymph nodes are identified bilaterally. The largest of these is noted on the right hand the bifurcation of the carotid artery which measures 13 mm in short axis. It measures approximately 24 mm in craniocaudad projection.  No acute bony abnormality is seen.  IMPRESSION: Changes in the right tonsillar/ peritonsillar region as described above. Although this may represent a peritonsillar abscess, the possibility of underlying neoplasm would deserve consideration as well. Close followup is recommended. Direct visualization may be helpful.   Electronically Signed   By: Inez Catalina M.D.   On: 06/30/2013 15:21     Microbiology: Recent Results (from the past 240 hour(s))  RAPID  STREP SCREEN     Status: Abnormal   Collection Time    06/30/13 11:56 AM      Result Value Ref Range Status   Streptococcus, Group A Screen (Direct) POSITIVE (*) NEGATIVE Final     Labs: Basic Metabolic Panel:  Recent Labs Lab 06/30/13 1131 07/01/13 0545 07/02/13 0350  NA 140 138 142  K 3.7 4.1 3.9  CL 102 104 105  CO2 23 17* 20  GLUCOSE 104* 161* 131*  BUN 14 10 14   CREATININE 1.06 0.78 0.81  CALCIUM 9.5 9.2 9.1   Liver Function Tests:  Recent Labs Lab 06/30/13 1131  AST 11  ALT 9  ALKPHOS 56  BILITOT 0.4  PROT 7.8  ALBUMIN 4.1    Recent Labs Lab 06/30/13 1145  LIPASE 18   No results found for this basename: AMMONIA,  in the last 168 hours CBC:  Recent Labs Lab 06/30/13 1131 07/01/13 0545 07/02/13 0350  WBC 17.8* 23.2* 27.4*  NEUTROABS 14.3*  --  24.1*  HGB 14.2 13.6 12.7  HCT 42.7 41.3 38.6  MCV 88.2 87.7 87.5  PLT 320 308 328   Cardiac Enzymes:  Recent Labs Lab 06/30/13 1233  TROPONINI <0.30   BNP: No components found with this basename: POCBNP,  CBG: No results found for this basename: GLUCAP,  in the last 168 hours  Time coordinating discharge:  Greater than 30 minutes  Signed:  Yazleemar Strassner, DO [facility] Pager: [phone] 07/02/2013, 9:36 AM

## 2014-06-22 ENCOUNTER — Encounter (HOSPITAL_COMMUNITY): Payer: Self-pay | Admitting: Emergency Medicine

## 2014-06-22 ENCOUNTER — Emergency Department (INDEPENDENT_AMBULATORY_CARE_PROVIDER_SITE_OTHER)
Admission: EM | Admit: 2014-06-22 | Discharge: 2014-06-22 | Disposition: A | Discharge status: Home / Self Care | Payer: Self-pay | Source: Home / Self Care | Attending: Family Medicine | Admitting: Family Medicine

## 2014-06-22 DIAGNOSIS — M25569 Pain in unspecified knee: Secondary | ICD-10-CM

## 2014-06-22 DIAGNOSIS — H1132 Conjunctival hemorrhage, left eye: Secondary | ICD-10-CM

## 2014-06-22 DIAGNOSIS — H11002 Unspecified pterygium of left eye: Secondary | ICD-10-CM

## 2014-06-22 MED ORDER — MELOXICAM 15 MG PO TABS: 7.5000 mg | ORAL_TABLET | Freq: Every day | ORAL | Status: DC

## 2014-06-22 NOTE — ED Notes (Signed)
C/o  Left eye redness.  States was in stressful situation last week and did a lot of screaming and woke the next day with eye being red.  Mild ache in the right eye.     Also c/o bilateral leg swelling from knee down.  Denies any known heart conditions and hypertension.   No relief with taking aleve.

## 2014-06-22 NOTE — Discharge Instructions (Signed)
Your knee pain is likely from chronic meniscal injury and arthritis. Please consider using a tight knee sleeve for support reviewed please start using the meloxicam for pain and inflammation. Please call your primary care doctor for follow-up appointment in 1-2 weeks if you're not better. Please only wear footwear that provides good arch support. Please decreased the amount of walking you're doing over this period of time. I suffered a small bleed which will resolve over the course of several weeks. You also have an inflammatory process called a pterygium. Please minimize your UV exposure by wearing sunglasses whenever outdoors and using preservative-free eyedrops.

## 2014-06-22 NOTE — ED Provider Notes (Signed)
CSN: [id number]     Arrival date & time 06/22/14  6629 History   First MD Initiated Contact with Patient 06/22/14 [phone]     Chief Complaint  Patient presents with  . Eye Problem  . Leg Swelling   (Consider location/radiation/quality/duration/timing/severity/associated sxs/prior Treatment) HPI   Bilat knee pain. Radiates down legs. 4wks. Comes and goes. Worse w/ walking. Changed footwear recently and now almost exclyusively wearing flats. Improves w/ rest and aleve.   She reports screaming at some neighbors yesterday and shortly thereafter she noticed that the middle part of her left eye was red. Denies pain, change in vision. Has not tried anything to make symptoms better. Symptoms are constant. Symptoms are not getting worse.     Past Medical History  Diagnosis Date  . Blood transfusion 1981    "related to my daughter being born"  . Fibroid tumor   . Gout   . Bipolar disorder    Past Surgical History  Procedure Laterality Date  . Abdominal hysterectomy      "for fibroid tumors"   Family History  Problem Relation Age of Onset  . Diabetes Neg Hx   . Heart failure Neg Hx   . Cancer Neg Hx    History  Substance Use Topics  . Smoking status: Current Every Day Smoker -- 0.12 packs/day for 33 years    Types: Cigarettes  . Smokeless tobacco: Never Used  . Alcohol Use: No   OB History    No data available     Review of Systems Per HPI with all other pertinent systems negative.   Allergies  Penicillins  Home Medications   Prior to Admission medications   Medication Sig Start Date End Date Taking? Authorizing Provider  ibuprofen (ADVIL) 200 MG tablet Take 200 mg by mouth once.    Historical Provider, MD  lactobacillus acidophilus (BACID) TABS tablet Take 2 tablets by mouth 3 (three) times daily. 07/02/13   Orson Eva, MD  meloxicam (MOBIC) 15 MG tablet Take 0.5-1 tablets (7.5-15 mg total) by mouth daily. 06/22/14   Waldemar Dickens, MD   BP 143/99 mmHg  Pulse 66   Temp(Src) 97.7 F (36.5 C) (Oral)  Resp 16  SpO2 97%  LMP 12/28/2010 Physical Exam Physical Exam  Constitutional: oriented to person, place, and time. appears well-developed and well-nourished. No distress.  HENT:  Head: Normocephalic and atraumatic.  Eyes: EOMI. PERRL.  mild subacute conjunctival hemorrhage of the left medial portion of the eye. Pterygium present. Neck: Normal range of motion.  Cardiovascular: RRR, no m/r/g, 2+ distal pulses,  Pulmonary/Chest: Effort normal and breath sounds normal. No respiratory distress.  Abdominal: Soft. Bowel sounds are normal. NonTTP, no distension.  Musculoskeletal: Knees full range of motion bilaterally, no effusions, medial joint line tenderness bilaterally, Lachman's negative, some pain with varus stressing bilaterally, valgus normal bilaterally,  Neurological: alert and oriented to person, place, and time.  Skin: Skin is warm. No rash noted. non diaphoretic.  Psychiatric: normal mood and affect. behavior is normal. Judgment and thought content normal.   ED Course  Procedures (including critical care time) Labs Review Labs Reviewed - No data to display  Imaging Review No results found.   MDM   1. Subconjunctival bleed, left   2. Pterygium eye, left   3. Knee pain, unspecified laterality    Neoprene knee sleeves, ice, supportive footwear, decreasing mileage, meloxicam, follow-up with PCP in 1-2 weeks if not improved. Pterygium of the left eye-recommending preservative-free eyedrops and sunglasses to  avoid UV exposure.  Conjunctival hemorrhage will take several weeks to resolve.    Waldemar Dickens, MD 06/22/14 [phone]

## 2016-01-27 ENCOUNTER — Encounter (HOSPITAL_COMMUNITY): Payer: Self-pay

## 2016-01-27 ENCOUNTER — Emergency Department (HOSPITAL_COMMUNITY)
Admission: EM | Admit: 2016-01-27 | Discharge: 2016-01-27 | Disposition: A | Discharge status: Home / Self Care | Payer: Self-pay | Attending: Emergency Medicine | Admitting: Emergency Medicine

## 2016-01-27 DIAGNOSIS — F17210 Nicotine dependence, cigarettes, uncomplicated: Secondary | ICD-10-CM | POA: Insufficient documentation

## 2016-01-27 DIAGNOSIS — Z79899 Other long term (current) drug therapy: Secondary | ICD-10-CM | POA: Insufficient documentation

## 2016-01-27 DIAGNOSIS — R1084 Generalized abdominal pain: Secondary | ICD-10-CM | POA: Insufficient documentation

## 2016-01-27 DIAGNOSIS — R197 Diarrhea, unspecified: Secondary | ICD-10-CM | POA: Insufficient documentation

## 2016-01-27 DIAGNOSIS — R112 Nausea with vomiting, unspecified: Secondary | ICD-10-CM | POA: Insufficient documentation

## 2016-01-27 LAB — COMPREHENSIVE METABOLIC PANEL
ALBUMIN: 4.3 g/dL (ref 3.5–5.0)
ALT: 16 U/L (ref 14–54)
AST: 17 U/L (ref 15–41)
Alkaline Phosphatase: 47 U/L (ref 38–126)
Anion gap: 6 (ref 5–15)
BUN: 19 mg/dL (ref 6–20)
CHLORIDE: 108 mmol/L (ref 101–111)
CO2: 27 mmol/L (ref 22–32)
CREATININE: 0.69 mg/dL (ref 0.44–1.00)
Calcium: 9 mg/dL (ref 8.9–10.3)
GFR calc non Af Amer: >60 mL/min (ref >60)
GLUCOSE: 121 mg/dL — AB (ref 65–99)
Potassium: 3.8 mmol/L (ref 3.5–5.1)
SODIUM: 141 mmol/L (ref 135–145)
Total Bilirubin: 0.3 mg/dL (ref 0.3–1.2)
Total Protein: 7.5 g/dL (ref 6.5–8.1)

## 2016-01-27 LAB — URINALYSIS, ROUTINE W REFLEX MICROSCOPIC
BILIRUBIN URINE: NEGATIVE
Glucose, UA: NEGATIVE mg/dL
Hgb urine dipstick: NEGATIVE
KETONES UR: NEGATIVE mg/dL
LEUKOCYTES UA: NEGATIVE
NITRITE: NEGATIVE
PH: 7 (ref 5.0–8.0)
PROTEIN: NEGATIVE mg/dL
Specific Gravity, Urine: 1.015 (ref 1.005–1.030)

## 2016-01-27 LAB — CBC
HCT: 42.9 % (ref 36.0–46.0)
HEMOGLOBIN: 13.7 g/dL (ref 12.0–15.0)
MCH: 28.4 pg (ref 26.0–34.0)
MCHC: 31.9 g/dL (ref 30.0–36.0)
MCV: 88.8 fL (ref 78.0–100.0)
Platelets: 321 10*3/uL (ref 150–400)
RBC: 4.83 MIL/uL (ref 3.87–5.11)
RDW: 14.8 % (ref 11.5–15.5)
WBC: 7.1 10*3/uL (ref 4.0–10.5)

## 2016-01-27 LAB — LIPASE, BLOOD: LIPASE: 29 U/L (ref 11–51)

## 2016-01-27 MED ORDER — SODIUM CHLORIDE 0.9 % IV BOLUS (SEPSIS)
1000.0000 mL | Freq: Once | INTRAVENOUS | Status: AC
  Administered 2016-01-27: 1000 mL via INTRAVENOUS

## 2016-01-27 MED ORDER — MORPHINE SULFATE (PF) 4 MG/ML IV SOLN
4.0000 mg | Freq: Once | INTRAVENOUS | Status: AC
  Administered 2016-01-27: 4 mg via INTRAVENOUS
  Filled 2016-01-27: qty 1

## 2016-01-27 MED ORDER — ONDANSETRON 4 MG PO TBDP: 4.0000 mg | ORAL_TABLET | Freq: Three times a day (TID) | ORAL | 0 refills | Status: DC | PRN

## 2016-01-27 MED ORDER — ONDANSETRON HCL 4 MG/2ML IJ SOLN
4.0000 mg | Freq: Once | INTRAMUSCULAR | Status: AC
  Administered 2016-01-27: 4 mg via INTRAVENOUS
  Filled 2016-01-27: qty 2

## 2016-01-27 NOTE — ED Notes (Signed)
Bed: [id number] Expected date:  Expected time:  Means of arrival:  Comments: 51yo F abdominal pain

## 2016-01-27 NOTE — Discharge Instructions (Signed)
Return to the ED with any concerns including vomiting and not able to keep down liquids or your medications, abdominal pain especially if it localizes to the right lower abdomen, fever or chills, and decreased urine output, decreased level of alertness or lethargy, or any other alarming symptoms.  °

## 2016-01-27 NOTE — ED Provider Notes (Signed)
[facility] DEPT Provider Note   CSN: [id number] Arrival date & time: 01/27/16  0241    By signing my name below, I, Macon Large, attest that this documentation has been prepared under the direction and in the presence of Alfonzo Beers, MD. Electronically Signed: Macon Large, ED Scribe. 01/27/16. 3:54 AM.  History   Chief Complaint Chief Complaint  Patient presents with  . Abdominal Pain   The history is provided by the patient. No language interpreter was used.   HPI Comments: Sydney Harris is a 51 y.o. female who presents to the Emergency Department complaining of moderate, constant, upper abdominal pain onset yesterday. Pt reports associated episodic diarrhea accompanied by episodic vomiting. She notes having two episodes of emesis this morning followed by ~3-4 epsiodes of diarrhea PTA. No alleviating factors noted. Denies fever. Pt also denies any recent sick contact.   Past Medical History:  Diagnosis Date  . Bipolar disorder ([place])   . Blood transfusion 1981   "related to my daughter being born"  . Fibroid tumor   . Gout     Patient Active Problem List   Diagnosis Date Noted  . Tonsillitis 06/30/2013  . Streptococcal infection group A 06/30/2013  . Leukocytosis, unspecified 06/30/2013    Past Surgical History:  Procedure Laterality Date  . ABDOMINAL HYSTERECTOMY     "for fibroid tumors"    OB History    No data available       Home Medications    Prior to Admission medications   Medication Sig Start Date End Date Taking? Authorizing Provider  lactobacillus acidophilus (BACID) TABS tablet Take 2 tablets by mouth 3 (three) times daily. Patient not taking: Reported on 01/27/2016 07/02/13   Orson Eva, MD  meloxicam (MOBIC) 15 MG tablet Take 0.5-1 tablets (7.5-15 mg total) by mouth daily. Patient not taking: Reported on 01/27/2016 06/22/14   Waldemar Dickens, MD  ondansetron (ZOFRAN ODT) 4 MG disintegrating tablet Take 1 tablet (4 mg total) by mouth  every 8 (eight) hours as needed for nausea or vomiting. 01/27/16   Alfonzo Beers, MD    Family History Family History  Problem Relation Age of Onset  . Diabetes Neg Hx   . Heart failure Neg Hx   . Cancer Neg Hx     Social History Social History  Substance Use Topics  . Smoking status: Current Every Day Smoker    Packs/day: 0.12    Years: 33.00    Types: Cigarettes  . Smokeless tobacco: Never Used  . Alcohol use No     Allergies   Penicillins   Review of Systems Review of Systems  Constitutional: Negative for fever.  Gastrointestinal: Positive for abdominal pain, diarrhea and vomiting.  All other systems reviewed and are negative.    Physical Exam Updated Vital Signs BP 115/76 (BP Location: Right Arm)   Pulse 60   Temp 97.8 F (36.6 C) (Oral)   Resp 16   Ht 5\' 3"  (1.6 m)   Wt 81.6 kg   LMP 12/28/2010   SpO2 98%   BMI 31.89 kg/m  Vitals reviewed Physical Exam Physical Examination: General appearance - alert, well appearing, and in no distress Mental status - alert, oriented to person, place, and time Eyes - pupils equal and reactive, extraocular eye movements intact Mouth - mucous membranes moist, pharynx normal without lesions Neck - supple, no significant adenopathy Chest - clear to auscultation, no wheezes, rales or rhonchi, symmetric air entry Heart - normal rate, regular rhythm, normal  S1, S2, no murmurs, rubs, clicks or gallops Abdomen - soft, mild epigastric discomfort with palpation, nondistended, no masses or organomegaly Neurological - alert, oriented, normal speech Extremities - peripheral pulses normal, no pedal edema, no clubbing or cyanosis Skin - normal coloration and turgor, no rashes  ED Treatments / Results   DIAGNOSTIC STUDIES: Oxygen Saturation is 98% on RA, normal by my interpretation.    COORDINATION OF CARE: 3:32 AM Discussed treatment plan with pt at bedside which includes labs and nausea medication and pt agreed to  plan.   Labs (all labs ordered are listed, but only abnormal results are displayed) Labs Reviewed  COMPREHENSIVE METABOLIC PANEL - Abnormal; Notable for the following:       Result Value   Glucose, Bld 121 (*)    All other components within normal limits  LIPASE, BLOOD  CBC  URINALYSIS, ROUTINE W REFLEX MICROSCOPIC    EKG  EKG Interpretation None       Radiology No results found.  Procedures Procedures (including critical care time)  Medications Ordered in ED Medications  ondansetron (ZOFRAN) injection 4 mg (4 mg Intravenous Given 01/27/16 0355)  morphine 4 MG/ML injection 4 mg (4 mg Intravenous Given 01/27/16 0355)  sodium chloride 0.9 % bolus 1,000 mL (0 mLs Intravenous Stopped 01/27/16 0514)     Initial Impression / Assessment and Plan / ED Course  I have reviewed the triage vital signs and the nursing notes.  Pertinent labs & imaging results that were available during my care of the patient were reviewed by me and considered in my medical decision making (see chart for details).  Clinical Course     Pt presenting with c/o upper abdominal pain with vomiting and diarrhea.  Symptoms started acutely prior to arrival.  Labs are reassuring.  Pt feeling much improved after IV meds and fluids.  With presence of v/d suspect viral gastroenteritis.  Doubt appendicitis, cholecystitis, obstructing gallstones, SBO, pancreatitis. No indication for imaging acutely.  If symptoms continue she may need RUQ ultrasound as an outpatient.  Pt given information for PMD followup.  Discharged with strict return precautions.  Pt agreeable with plan.  Final Clinical Impressions(s) / ED Diagnoses   Final diagnoses:  Nausea vomiting and diarrhea  Generalized abdominal pain    New Prescriptions Discharge Medication List as of 01/27/2016  6:16 AM    START taking these medications   Details  ondansetron (ZOFRAN ODT) 4 MG disintegrating tablet Take 1 tablet (4 mg total) by mouth every 8 (eight)  hours as needed for nausea or vomiting., Starting Sun 01/27/2016, Print        I personally performed the services described in this documentation, which was scribed in my presence. The recorded information has been reviewed and is accurate.      Alfonzo Beers, MD 01/30/16 2021

## 2016-01-27 NOTE — ED Notes (Signed)
Asked pt if she could give a urine sample, but not able to yet.

## 2016-01-27 NOTE — ED Notes (Signed)
No respiratory or acute distress noted alert and oriented x 3 no reaction to medication noted visitor with pt call light in reach.

## 2016-01-27 NOTE — ED Triage Notes (Signed)
Abdominal pain for 2 hours with nausea and vomiting earlier no diarrhea no fever no urinary symptoms.

## 2016-02-08 ENCOUNTER — Encounter (HOSPITAL_COMMUNITY): Payer: Self-pay | Admitting: *Deleted

## 2016-02-08 ENCOUNTER — Emergency Department (HOSPITAL_COMMUNITY)
Admission: EM | Admit: 2016-02-08 | Discharge: 2016-02-08 | Disposition: A | Discharge status: Home / Self Care | Payer: BLUE CROSS/BLUE SHIELD | Attending: Emergency Medicine | Admitting: Emergency Medicine

## 2016-02-08 ENCOUNTER — Emergency Department (HOSPITAL_COMMUNITY): Payer: BLUE CROSS/BLUE SHIELD

## 2016-02-08 DIAGNOSIS — F17210 Nicotine dependence, cigarettes, uncomplicated: Secondary | ICD-10-CM | POA: Diagnosis not present

## 2016-02-08 DIAGNOSIS — R52 Pain, unspecified: Secondary | ICD-10-CM | POA: Insufficient documentation

## 2016-02-08 DIAGNOSIS — J111 Influenza due to unidentified influenza virus with other respiratory manifestations: Secondary | ICD-10-CM

## 2016-02-08 DIAGNOSIS — R69 Illness, unspecified: Secondary | ICD-10-CM

## 2016-02-08 LAB — CBC
HEMATOCRIT: 40.7 % (ref 36.0–46.0)
HEMOGLOBIN: 13.3 g/dL (ref 12.0–15.0)
MCH: 28.7 pg (ref 26.0–34.0)
MCHC: 32.7 g/dL (ref 30.0–36.0)
MCV: 87.7 fL (ref 78.0–100.0)
Platelets: 311 10*3/uL (ref 150–400)
RBC: 4.64 MIL/uL (ref 3.87–5.11)
RDW: 14.8 % (ref 11.5–15.5)
WBC: 5.3 10*3/uL (ref 4.0–10.5)

## 2016-02-08 LAB — COMPREHENSIVE METABOLIC PANEL
ALBUMIN: 4 g/dL (ref 3.5–5.0)
ALK PHOS: 50 U/L (ref 38–126)
ALT: 22 U/L (ref 14–54)
ANION GAP: 10 (ref 5–15)
AST: 26 U/L (ref 15–41)
BILIRUBIN TOTAL: 0.4 mg/dL (ref 0.3–1.2)
BUN: 9 mg/dL (ref 6–20)
CALCIUM: 9.1 mg/dL (ref 8.9–10.3)
CO2: 23 mmol/L (ref 22–32)
Chloride: 104 mmol/L (ref 101–111)
Creatinine, Ser: 0.84 mg/dL (ref 0.44–1.00)
Glucose, Bld: 90 mg/dL (ref 65–99)
Potassium: 3.2 mmol/L — ABNORMAL LOW (ref 3.5–5.1)
Sodium: 137 mmol/L (ref 135–145)
TOTAL PROTEIN: 7.1 g/dL (ref 6.5–8.1)

## 2016-02-08 MED ORDER — ACETAMINOPHEN 325 MG PO TABS
650.0000 mg | ORAL_TABLET | Freq: Once | ORAL | Status: AC
  Administered 2016-02-08: 650 mg via ORAL

## 2016-02-08 MED ORDER — ONDANSETRON 4 MG PO TBDP: ORAL_TABLET | ORAL | 0 refills | Status: DC

## 2016-02-08 MED ORDER — ONDANSETRON 4 MG PO TBDP
4.0000 mg | ORAL_TABLET | Freq: Once | ORAL | Status: DC
  Filled 2016-02-08: qty 1

## 2016-02-08 MED ORDER — ACETAMINOPHEN 325 MG PO TABS
ORAL_TABLET | ORAL | Status: AC
  Filled 2016-02-08: qty 2

## 2016-02-08 MED ORDER — POTASSIUM CHLORIDE CRYS ER 20 MEQ PO TBCR
40.0000 meq | EXTENDED_RELEASE_TABLET | Freq: Once | ORAL | Status: AC
  Administered 2016-02-08: 40 meq via ORAL
  Filled 2016-02-08: qty 2

## 2016-02-08 MED ORDER — SODIUM CHLORIDE 0.9 % IV BOLUS (SEPSIS): 1000.0000 mL | Freq: Once | INTRAVENOUS | Status: DC

## 2016-02-08 MED ORDER — OSELTAMIVIR PHOSPHATE 75 MG PO CAPS: 75.0000 mg | ORAL_CAPSULE | Freq: Two times a day (BID) | ORAL | 0 refills | Status: DC

## 2016-02-08 MED ORDER — ONDANSETRON 4 MG PO TBDP
ORAL_TABLET | ORAL | Status: AC
  Administered 2016-02-08: 4 mg
  Filled 2016-02-08: qty 1

## 2016-02-08 NOTE — Discharge Instructions (Signed)
Take tylenol and motrin for pain and fevers. Stay hydrated. Take zofran for vomiting. Take viral Tamiflu for flu like illness.  If you were given medicines take as directed.  If you are on coumadin or contraceptives realize their levels and effectiveness is altered by many different medicines.  If you have any reaction (rash, tongues swelling, other) to the medicines stop taking and see a physician.    If your blood pressure was elevated in the ER make sure you follow up for management with a primary doctor or return for chest pain, shortness of breath or stroke symptoms.  Please follow up as directed and return to the ER or see a physician for new or worsening symptoms.  Thank you. Vitals:   02/08/16 1446 02/08/16 1447 02/08/16 1548  BP: 132/87  122/74  Pulse: 88  80  Resp: 20  18  Temp: 99.4 F (37.4 C)  99.4 F (37.4 C)  TempSrc: Oral  Oral  SpO2: 99%  100%  Weight:  180 lb (81.6 kg)   Height:  5\' 3"  (1.6 m)

## 2016-02-08 NOTE — ED Triage Notes (Signed)
Pt states URI s/s accompanied by body aches and vomiting.

## 2016-02-08 NOTE — ED Provider Notes (Signed)
[facility] DEPT Provider Note   CSN: [id number] Arrival date & time: 02/08/16  1406     History   Chief Complaint Chief Complaint  Patient presents with  . Emesis  . Generalized Body Aches    HPI Sydney Harris is a 51 y.o. female.  Patient with history of bipolar, fibroids, hysterectomy, possible spleen removal patient unsure details presents with body aches and flulike symptoms for 2-3 days. Sick contact in the family. No neck stiffness. Patient's had vomiting diarrhea as well. Symptoms intermittent.      Past Medical History:  Diagnosis Date  . Bipolar disorder ([place])   . Blood transfusion 1981   "related to my daughter being born"  . Fibroid tumor   . Gout     Patient Active Problem List   Diagnosis Date Noted  . Tonsillitis 06/30/2013  . Streptococcal infection group A 06/30/2013  . Leukocytosis, unspecified 06/30/2013    Past Surgical History:  Procedure Laterality Date  . ABDOMINAL HYSTERECTOMY     "for fibroid tumors"    OB History    No data available       Home Medications    Prior to Admission medications   Medication Sig Start Date End Date Taking? Authorizing Provider  lactobacillus acidophilus (BACID) TABS tablet Take 2 tablets by mouth 3 (three) times daily. Patient not taking: Reported on 01/27/2016 07/02/13   Orson Eva, MD  meloxicam (MOBIC) 15 MG tablet Take 0.5-1 tablets (7.5-15 mg total) by mouth daily. Patient not taking: Reported on 01/27/2016 06/22/14   Waldemar Dickens, MD  ondansetron (ZOFRAN ODT) 4 MG disintegrating tablet Take 1 tablet (4 mg total) by mouth every 8 (eight) hours as needed for nausea or vomiting. 01/27/16   Alfonzo Beers, MD  ondansetron (ZOFRAN ODT) 4 MG disintegrating tablet 4mg  ODT q4 hours prn nausea/vomit 02/08/16   Elnora Morrison, MD  oseltamivir (TAMIFLU) 75 MG capsule Take 1 capsule (75 mg total) by mouth every 12 (twelve) hours. 02/08/16   Elnora Morrison, MD    Family History Family History  Problem  Relation Age of Onset  . Diabetes Neg Hx   . Heart failure Neg Hx   . Cancer Neg Hx     Social History Social History  Substance Use Topics  . Smoking status: Current Every Day Smoker    Packs/day: 0.12    Years: 33.00    Types: Cigarettes  . Smokeless tobacco: Never Used  . Alcohol use No     Allergies   Penicillins   Review of Systems Review of Systems  Constitutional: Positive for appetite change, chills and fatigue. Negative for fever.  HENT: Positive for congestion.   Eyes: Negative for visual disturbance.  Respiratory: Positive for cough. Negative for shortness of breath.   Cardiovascular: Negative for chest pain.  Gastrointestinal: Positive for diarrhea, nausea and vomiting. Negative for abdominal pain.  Genitourinary: Negative for dysuria and flank pain.  Musculoskeletal: Negative for back pain, neck pain and neck stiffness.  Skin: Negative for rash.  Neurological: Positive for headaches. Negative for light-headedness.     Physical Exam Updated Vital Signs BP 122/74 (BP Location: Right Arm)   Pulse 80   Temp 99.4 F (37.4 C) (Oral)   Resp 18   Ht 5\' 3"  (1.6 m)   Wt 180 lb (81.6 kg)   LMP 12/28/2010   SpO2 100%   BMI 31.89 kg/m   Physical Exam  Constitutional: She is oriented to person, place, and time. She appears well-developed  and well-nourished.  HENT:  Head: Normocephalic and atraumatic.  Dry mucous membranes  Eyes: Conjunctivae are normal. Right eye exhibits no discharge. Left eye exhibits no discharge.  Neck: Normal range of motion. Neck supple. No tracheal deviation present.  Cardiovascular: Normal rate and regular rhythm.   Pulmonary/Chest: Effort normal and breath sounds normal.  Abdominal: Soft. She exhibits no distension. There is no tenderness. There is no guarding.  Musculoskeletal: She exhibits no edema.  Lymphadenopathy:    She has no cervical adenopathy.  Neurological: She is alert and oriented to person, place, and time.  Skin:  Skin is warm. No rash noted.  Psychiatric: She has a normal mood and affect.  Nursing note and vitals reviewed.    ED Treatments / Results  Labs (all labs ordered are listed, but only abnormal results are displayed) Labs Reviewed  COMPREHENSIVE METABOLIC PANEL - Abnormal; Notable for the following:       Result Value   Potassium 3.2 (*)    All other components within normal limits  CULTURE, BLOOD (ROUTINE X 2)  CULTURE, BLOOD (ROUTINE X 2)  CBC  URINALYSIS, ROUTINE W REFLEX MICROSCOPIC    EKG  EKG Interpretation None       Radiology Dg Chest 2 View  Result Date: 02/08/2016 CLINICAL DATA:  Cough and congestion.  Body aches. EXAM: CHEST  2 VIEW COMPARISON:  06/30/2013 FINDINGS: Normal heart size. No pleural effusion or edema. No airspace opacities identified. Atelectasis is identified within the left lower lobe. IMPRESSION: 1. Left lower lobe atelectasis. 2. No pneumonia. Electronically Signed   By: Kerby Moors M.D.   On: 02/08/2016 17:03    Procedures Procedures (including critical care time)  Medications Ordered in ED Medications  ondansetron (ZOFRAN-ODT) disintegrating tablet 4 mg (not administered)  acetaminophen (TYLENOL) 325 MG tablet (not administered)  sodium chloride 0.9 % bolus 1,000 mL (not administered)  potassium chloride SA (K-DUR,KLOR-CON) CR tablet 40 mEq (not administered)  acetaminophen (TYLENOL) tablet 650 mg (650 mg Oral Given 02/08/16 1453)  ondansetron (ZOFRAN-ODT) 4 MG disintegrating tablet (4 mg  Given 02/08/16 1454)     Initial Impression / Assessment and Plan / ED Course  I have reviewed the triage vital signs and the nursing notes.  Pertinent labs & imaging results that were available during my care of the patient were reviewed by me and considered in my medical decision making (see chart for details).   patient presents with flulike illness. Patient overall well-appearing no fever no tachycardia. No neck stiffness. Questionable spleen  removal in the past. Discussed reassessment on Monday. Plan for IV fluids, supportive medications. Patient improved on reassessment. Patient comfortable going home with family member reasons to return discussed. Work note discussed Results and differential diagnosis were discussed with the patient/parent/guardian. Xrays were independently reviewed by myself.  Close follow up outpatient was discussed, comfortable with the plan.   Medications  ondansetron (ZOFRAN-ODT) disintegrating tablet 4 mg (not administered)  acetaminophen (TYLENOL) 325 MG tablet (not administered)  sodium chloride 0.9 % bolus 1,000 mL (not administered)  potassium chloride SA (K-DUR,KLOR-CON) CR tablet 40 mEq (not administered)  acetaminophen (TYLENOL) tablet 650 mg (650 mg Oral Given 02/08/16 1453)  ondansetron (ZOFRAN-ODT) 4 MG disintegrating tablet (4 mg  Given 02/08/16 1454)    Vitals:   02/08/16 1446 02/08/16 1447 02/08/16 1548  BP: 132/87  122/74  Pulse: 88  80  Resp: 20  18  Temp: 99.4 F (37.4 C)  99.4 F (37.4 C)  TempSrc: Oral  Oral  SpO2: 99%  100%  Weight:  180 lb (81.6 kg)   Height:  5\' 3"  (1.6 m)     Final diagnoses:  Influenza-like illness     Final Clinical Impressions(s) / ED Diagnoses   Final diagnoses:  Influenza-like illness    New Prescriptions New Prescriptions   ONDANSETRON (ZOFRAN ODT) 4 MG DISINTEGRATING TABLET    4mg  ODT q4 hours prn nausea/vomit   OSELTAMIVIR (TAMIFLU) 75 MG CAPSULE    Take 1 capsule (75 mg total) by mouth every 12 (twelve) hours.     Elnora Morrison, MD 02/08/16 [phone]

## 2016-02-13 LAB — CULTURE, BLOOD (ROUTINE X 2): Culture: NO GROWTH

## 2016-06-25 ENCOUNTER — Encounter (HOSPITAL_COMMUNITY): Payer: Self-pay | Admitting: *Deleted

## 2016-06-25 ENCOUNTER — Ambulatory Visit (HOSPITAL_COMMUNITY)
Admission: EM | Admit: 2016-06-25 | Discharge: 2016-06-25 | Disposition: A | Discharge status: Home / Self Care | Payer: BLUE CROSS/BLUE SHIELD | Attending: Internal Medicine | Admitting: Internal Medicine

## 2016-06-25 DIAGNOSIS — B9789 Other viral agents as the cause of diseases classified elsewhere: Secondary | ICD-10-CM | POA: Diagnosis not present

## 2016-06-25 DIAGNOSIS — J069 Acute upper respiratory infection, unspecified: Secondary | ICD-10-CM | POA: Diagnosis not present

## 2016-06-25 MED ORDER — LEVOCETIRIZINE DIHYDROCHLORIDE 5 MG PO TABS: 5.0000 mg | ORAL_TABLET | Freq: Every evening | ORAL | 0 refills | Status: DC

## 2016-06-25 MED ORDER — NAPROXEN 500 MG PO TABS: 500.0000 mg | ORAL_TABLET | Freq: Two times a day (BID) | ORAL | 0 refills | Status: AC

## 2016-06-25 MED ORDER — BENZONATATE 100 MG PO CAPS: 100.0000 mg | ORAL_CAPSULE | Freq: Three times a day (TID) | ORAL | 0 refills | Status: DC

## 2016-06-25 NOTE — ED Provider Notes (Signed)
CSN: [id number]     Arrival date & time 06/25/16  1405 History   None    Chief Complaint  Patient presents with  . Cough   (Consider location/radiation/quality/duration/timing/severity/associated sxs/prior Treatment) HPI  Presenting for non bloody productive cough that started three days ago.  Associates nasal congestion, sore throat, body aches, and pink eye of the left eye. Feels that she is getting worse.  Endorses subjective fever. Has tired Mucinex and Alkaseltzer plus, cough drops all without relief. She tells me she has quit smoking about 6 months ago. Started smoking "10 years ago." Smoked about 1/2 pack daily.   She takes seroquel and trazodone for control of BPAD.   Past Medical History:  Diagnosis Date  . Bipolar disorder ([place])   . Blood transfusion 1981   "related to my daughter being born"  . Fibroid tumor   . Gout    Past Surgical History:  Procedure Laterality Date  . ABDOMINAL HYSTERECTOMY     "for fibroid tumors"   Family History  Problem Relation Age of Onset  . Diabetes Neg Hx   . Heart failure Neg Hx   . Cancer Neg Hx    Social History  Substance Use Topics  . Smoking status: Former Smoker    Packs/day: 0.12    Years: 33.00    Types: Cigarettes  . Smokeless tobacco: Never Used  . Alcohol use No   OB History    No data available     Review of Systems  Constitutional: Negative for chills, diaphoresis and fever.  HENT: Positive for congestion and sore throat.   Respiratory: Positive for cough. Negative for shortness of breath and wheezing.   Cardiovascular: Negative for chest pain.  Gastrointestinal: Negative for nausea.  Endocrine: Negative for polydipsia.  Skin: Negative for rash.  Neurological: Negative for dizziness and weakness.    Allergies  Penicillins  Home Medications    Meds Ordered and Administered this Visit  Medications - No data to display  BP 138/88 (BP Location: Right Arm)   Pulse 78   Temp 98.8 F (37.1 C) (Oral)    Resp 16   LMP 12/28/2010   SpO2 99%  No data found.  Vitals:   06/25/16 1448  BP: 138/88  Pulse: 78  Resp: 16  Temp: 98.8 F (37.1 C)    Physical Exam  Constitutional: She is oriented to person, place, and time. She appears well-nourished. No distress.  HENT:  Right Ear: External ear normal.  Left Ear: External ear normal.  Nose: Mucosal edema present. Right sinus exhibits no maxillary sinus tenderness and no frontal sinus tenderness. Left sinus exhibits no maxillary sinus tenderness and no frontal sinus tenderness.  Mouth/Throat: Oropharynx is clear and moist. No oropharyngeal exudate.  Eyes: Conjunctivae and EOM are normal. Pupils are equal, round, and reactive to light.  Cardiovascular: Normal rate, regular rhythm and normal heart sounds.   Pulmonary/Chest: Effort normal and breath sounds normal. She has no rales.  Abdominal: She exhibits no distension.  Neurological: She is alert and oriented to person, place, and time. No cranial nerve deficit. Gait normal.  Skin: Skin is warm and dry. No rash noted. She is not diaphoretic. No erythema.  Psychiatric: She has a normal mood and affect. Her behavior is normal.  Vitals reviewed.   Urgent Care Course     Procedures (including critical care time)     MDM   1. Viral URI with cough    Likely adeno given sore throat and  left sided conjunctivitis.  Lungs clear.  Vitals normal.     Tereasa Coop, PA-C 06/25/16 1553

## 2016-06-25 NOTE — ED Triage Notes (Signed)
Pt  Reports  Symptoms  Of  Cough  Nasal    Congestion   l  Eye  Is  Red  And  Has  sorethroat     Pt  Sounds  Hoarse   As    Well

## 2016-12-18 ENCOUNTER — Ambulatory Visit (INDEPENDENT_AMBULATORY_CARE_PROVIDER_SITE_OTHER): Payer: BLUE CROSS/BLUE SHIELD

## 2016-12-18 ENCOUNTER — Other Ambulatory Visit: Payer: Self-pay

## 2016-12-18 ENCOUNTER — Ambulatory Visit (HOSPITAL_COMMUNITY)
Admission: EM | Admit: 2016-12-18 | Discharge: 2016-12-18 | Disposition: A | Discharge status: Home / Self Care | Payer: BLUE CROSS/BLUE SHIELD | Attending: Physician Assistant | Admitting: Physician Assistant

## 2016-12-18 ENCOUNTER — Encounter (HOSPITAL_COMMUNITY): Payer: Self-pay | Admitting: Emergency Medicine

## 2016-12-18 DIAGNOSIS — M1712 Unilateral primary osteoarthritis, left knee: Secondary | ICD-10-CM

## 2016-12-18 LAB — GLUCOSE, CAPILLARY: Glucose-Capillary: 79 mg/dL (ref 65–99)

## 2016-12-18 MED ORDER — HYDROCODONE-ACETAMINOPHEN 5-325 MG PO TABS: 1.0000 | ORAL_TABLET | ORAL | 0 refills | Status: DC | PRN

## 2016-12-18 NOTE — ED Triage Notes (Signed)
Left knee pain for one week, swollen and painful.  Denies injury.   Left bottom of foot burns every morning and this is bothering her for a week.    Requesting a sugar check.    Patient does not have a pcp

## 2016-12-18 NOTE — ED Provider Notes (Signed)
[facility]    CSN: [id number] Arrival date & time: 12/18/16  7622     History   Chief Complaint Chief Complaint  Patient presents with  . Knee Pain    HPI Sydney Harris is a 51 y.o. female.   51 year-old female, with history of gout, "borderline diabetes", presenting today due to left knee pain. She states that she has had left knee pain over the past week. Denies any injury to the knee. She has increased pain with weight bearing which is causing her gait to be altered. She is also complaining of left foot pain in the sole of the left foot that started about the same time as the knee pain. She states that her neighbor gave her a pecrocet and this helped her pain Patient states she has been told in the past that she has borderline diabetes and would like to have her blood sugar checked here today. She denies any associated symptoms such as polyuria, polydipsia    The history is provided by the patient.  Knee Pain  Location:  Knee Time since incident:  1 week Injury: no   Knee location:  L knee Pain details:    Quality:  Aching   Radiates to:  Does not radiate   Severity:  Mild   Onset quality:  Gradual   Duration:  1 week   Timing:  Constant   Progression:  Unchanged Chronicity:  New Dislocation: no   Foreign body present:  No foreign bodies Tetanus status:  Unknown Prior injury to area:  No Relieved by:  Nothing Worsened by:  Bearing weight Ineffective treatments: narcotics  Associated symptoms: swelling   Associated symptoms: no back pain, no decreased ROM, no fatigue, no fever, no itching, no muscle weakness, no neck pain, no numbness and no stiffness   Risk factors: obesity   Risk factors: no concern for non-accidental trauma, no frequent fractures and no known bone disorder     Past Medical History:  Diagnosis Date  . Bipolar disorder ([place])   . Blood transfusion 1981   "related to my daughter being born"  . Fibroid tumor   . Gout      Patient Active Problem List   Diagnosis Date Noted  . Tonsillitis 06/30/2013  . Streptococcal infection group A 06/30/2013  . Leukocytosis, unspecified 06/30/2013    Past Surgical History:  Procedure Laterality Date  . ABDOMINAL HYSTERECTOMY     "for fibroid tumors"    OB History    No data available       Home Medications    Prior to Admission medications   Medication Sig Start Date End Date Taking? Authorizing Provider  benzonatate (TESSALON) 100 MG capsule Take 1 capsule (100 mg total) by mouth every 8 (eight) hours. 06/25/16   Tereasa Coop, PA-C  HYDROcodone-acetaminophen (NORCO/VICODIN) 5-325 MG tablet Take 1 tablet by mouth every 4 (four) hours as needed. 12/18/16   Beyonce Sawatzky C, PA-C  levocetirizine (XYZAL) 5 MG tablet Take 1 tablet (5 mg total) by mouth every evening. 06/25/16   Tereasa Coop, PA-C    Family History Family History  Problem Relation Age of Onset  . Diabetes Neg Hx   . Heart failure Neg Hx   . Cancer Neg Hx     Social History Social History   Tobacco Use  . Smoking status: Former Smoker    Packs/day: 0.12    Years: 33.00    Pack years: 3.96  Types: Cigarettes  . Smokeless tobacco: Never Used  Substance Use Topics  . Alcohol use: No  . Drug use: Yes    Types: "Crack" cocaine    Comment: 06/30/2013 "last used crack ~ 03/2013"     Allergies   Penicillins   Review of Systems Review of Systems  Constitutional: Negative for chills, fatigue and fever.  HENT: Negative for congestion, dental problem, drooling, ear pain, facial swelling, mouth sores and sore throat.   Eyes: Negative for pain and visual disturbance.  Respiratory: Negative for cough and shortness of breath.   Cardiovascular: Negative for chest pain and palpitations.  Gastrointestinal: Negative for abdominal pain and vomiting.  Genitourinary: Negative for dysuria and hematuria.  Musculoskeletal: Positive for arthralgias (left knee ). Negative for back pain, neck  pain and stiffness.  Skin: Negative for color change, itching and rash.  Neurological: Negative for seizures, syncope and headaches.  All other systems reviewed and are negative.    Physical Exam Triage Vital Signs ED Triage Vitals  Enc Vitals Group     BP 12/18/16 1008 127/87     Pulse Rate 12/18/16 1008 70     Resp 12/18/16 1008 (!) 22     Temp --      Temp src --      SpO2 12/18/16 1008 98 %     Weight --      Height --      Head Circumference --      Peak Flow --      Pain Score 12/18/16 1006 9     Pain Loc --      Pain Edu? --      Excl. in [place]? --    No data found.  Updated Vital Signs BP 127/87 (BP Location: Right Arm) Comment (BP Location): large cuff  Pulse 70   Temp (!) 97.2 F (36.2 C) (Oral)   Resp (!) 22   LMP 12/28/2010   SpO2 98%   Visual Acuity Right Eye Distance:   Left Eye Distance:   Bilateral Distance:    Right Eye Near:   Left Eye Near:    Bilateral Near:     Physical Exam  Constitutional: She appears well-developed and well-nourished. No distress.  HENT:  Head: Normocephalic and atraumatic.  Eyes: Conjunctivae are normal.  Neck: Neck supple.  Cardiovascular: Normal rate and regular rhythm.  No murmur heard. Pulmonary/Chest: Effort normal and breath sounds normal. No respiratory distress.  Abdominal: Soft. There is no tenderness.  Musculoskeletal: She exhibits no edema.       Left knee: She exhibits swelling. Tenderness found. Medial joint line tenderness noted.       Left foot: There is tenderness.  Neurological: She is alert.  Skin: Skin is warm and dry.  Psychiatric: She has a normal mood and affect.  Nursing note and vitals reviewed.    UC Treatments / Results  Labs (all labs ordered are listed, but only abnormal results are displayed) Labs Reviewed  GLUCOSE, CAPILLARY    EKG  EKG Interpretation None       Radiology Dg Knee Complete 4 Views Left  Result Date: 12/18/2016 CLINICAL DATA:  Left knee pain for 1  week, no acute injury EXAM: LEFT KNEE - COMPLETE 4+ VIEW COMPARISON:  None. FINDINGS: There is primarily unicompartmental degenerative joint disease of the left knee involving the medial compartment where there is more loss of joint space, sclerosis, and spurring present. Very minimal spurring is present from the lateral compartment.  The patellofemoral compartment is unremarkable. No fracture seen and no joint effusion is noted. 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 No acute abnormality. Electronically Signed   By: Ivar Drape M.D.   On: 12/18/2016 10:39    Procedures Procedures (including critical care time)  Medications Ordered in UC Medications - No data to display   Initial Impression / Assessment and Plan / UC Course  I have reviewed the triage vital signs and the nursing notes.  Pertinent labs & imaging results that were available during my care of the patient were reviewed by me and considered in my medical decision making (see chart for details).     XR shows knee OA - she is requesting pain meds - written for small amount PCP assistance selected Normal blood sugar today Ortho referral for knee pain  Final Clinical Impressions(s) / UC Diagnoses   Final diagnoses:  Arthritis of left knee    ED Discharge Orders        Ordered    HYDROcodone-acetaminophen (NORCO/VICODIN) 5-325 MG tablet  Every 4 hours PRN     12/18/16 1042       Controlled Substance Prescriptions [place] Controlled Substance Registry consulted? Yes, I have consulted the [place] Controlled Substances Registry for this patient, and feel the risk/benefit ratio today is favorable for proceeding with this prescription for a controlled substance.   Phebe Colla, [place] 12/18/16 1055

## 2017-06-12 ENCOUNTER — Ambulatory Visit (HOSPITAL_COMMUNITY)
Admission: EM | Admit: 2017-06-12 | Discharge: 2017-06-12 | Disposition: A | Discharge status: Home / Self Care | Payer: BLUE CROSS/BLUE SHIELD | Attending: Family Medicine | Admitting: Family Medicine

## 2017-06-12 ENCOUNTER — Encounter (HOSPITAL_COMMUNITY): Payer: Self-pay

## 2017-06-12 DIAGNOSIS — M722 Plantar fascial fibromatosis: Secondary | ICD-10-CM

## 2017-06-12 DIAGNOSIS — R350 Frequency of micturition: Secondary | ICD-10-CM

## 2017-06-12 LAB — POCT URINALYSIS DIP (DEVICE)
Bilirubin Urine: NEGATIVE
Glucose, UA: NEGATIVE mg/dL
HGB URINE DIPSTICK: NEGATIVE
KETONES UR: NEGATIVE mg/dL
Nitrite: NEGATIVE
PH: 6.5 (ref 5.0–8.0)
PROTEIN: NEGATIVE mg/dL
SPECIFIC GRAVITY, URINE: 1.02 (ref 1.005–1.030)
Urobilinogen, UA: 0.2 mg/dL (ref 0.0–1.0)

## 2017-06-12 LAB — GLUCOSE, CAPILLARY: Glucose-Capillary: 113 mg/dL — ABNORMAL HIGH (ref 65–99)

## 2017-06-12 MED ORDER — METHYLPREDNISOLONE 4 MG PO TBPK: ORAL_TABLET | ORAL | 0 refills | Status: DC

## 2017-06-12 MED ORDER — MELOXICAM 15 MG PO TABS: 15.0000 mg | ORAL_TABLET | Freq: Every day | ORAL | 0 refills | Status: DC

## 2017-06-12 NOTE — ED Triage Notes (Signed)
Pt presents with complaints of right ankle pain and swelling x 1 month. Denies any injury.

## 2017-06-12 NOTE — ED Provider Notes (Signed)
[name]    CSN: [id number] Arrival date & time: 06/12/17  1059     History   Chief Complaint Chief Complaint  Patient presents with  . Ankle Pain    HPI Sydney Harris is a 52 y.o. female.   HPI  Patient is here with multiple complaints.  First, she is here because of right foot pain.  Is been gradually coming on for a couple of weeks.  It hurts to bear weight on her right foot.  Pain is at its worse when she first gets up in the morning, or when she starts walking after sitting for period of time.  She works in a [employer], [employer] technologist.  She is on her feet all the time.  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  She thinks is a good shoe.  She also has some swelling in her ankles.  She states this is been going on for some time.  I did explain to her that this is likely from her salt intake, she does eat a lot of salty foods.  I do not think this is from a serious disease process.  She also complains that she has urinary frequency and wants to recheck for diabetes.  She states diabetes runs in her family.  She is obese.  She does not have a lot of excess thirst or appetite.  She thinks that her foot is hurting because of diabetes.  Past Medical History:  Diagnosis Date  . Bipolar disorder ([place])   . Blood transfusion 1981   "related to my daughter being born"  . Fibroid tumor   . Gout     Patient Active Problem List   Diagnosis Date Noted  . Tonsillitis 06/30/2013  . Streptococcal infection group A 06/30/2013  . Leukocytosis, unspecified 06/30/2013    Past Surgical History:  Procedure Laterality Date  . ABDOMINAL HYSTERECTOMY     "for fibroid tumors"    OB History   None      Home Medications    Prior to Admission medications   Medication Sig Start Date End Date Taking? Authorizing Provider  levocetirizine (XYZAL) 5 MG tablet Take 1 tablet (5 mg total) by mouth every evening. 06/25/16  Yes Tereasa Coop, PA-C  traZODone  (DESYREL) 100 MG tablet Take 100 mg by mouth at bedtime.   Yes [provider]  meloxicam (MOBIC) 15 MG tablet Take 1 tablet (15 mg total) by mouth daily. 06/12/17   Raylene Everts, MD  methylPREDNISolone (MEDROL DOSEPAK) 4 MG TBPK tablet tad 06/12/17   Raylene Everts, MD    Family History Family History  Problem Relation Age of Onset  . Diabetes Neg Hx   . Heart failure Neg Hx   . Cancer Neg Hx     Social History Social History   Tobacco Use  . Smoking status: Former Smoker    Packs/day: 0.12    Years: 33.00    Pack years: 3.96    Types: Cigarettes  . Smokeless tobacco: Never Used  Substance Use Topics  . Alcohol use: No  . Drug use: Yes    Types: "Crack" cocaine    Comment: 06/30/2013 "last used crack ~ 03/2013"     Allergies   Penicillins   Review of Systems Review of Systems  Constitutional: Negative for chills and fever.  HENT: Negative for ear pain and sore throat.   Eyes: Negative for pain and visual disturbance.  Respiratory: Negative for  cough and shortness of breath.   Cardiovascular: Negative for chest pain and palpitations.  Gastrointestinal: Negative for abdominal pain and vomiting.  Genitourinary: Positive for frequency. Negative for dysuria, flank pain and hematuria.  Musculoskeletal: Positive for gait problem. Negative for arthralgias and back pain.  Skin: Negative for color change and rash.  Neurological: Negative for seizures and syncope.  All other systems reviewed and are negative.   Physical Exam Triage Vital Signs ED Triage Vitals  Enc Vitals Group     BP 06/12/17 1142 111/68     Pulse Rate 06/12/17 1142 81     Resp 06/12/17 1142 18     Temp 06/12/17 1142 98.8 F (37.1 C)     Temp src --      SpO2 06/12/17 1142 96 %     Weight --      Height --      Head Circumference --      Peak Flow --      Pain Score 06/12/17 1141 10     Pain Loc --      Pain Edu? --      Excl. in [place]? --    No data found.  Updated Vital  Signs BP 111/68   Pulse 81   Temp 98.8 F (37.1 C)   Resp 18   LMP 12/28/2010   SpO2 96%   Visual Acuity Right Eye Distance:   Left Eye Distance:   Bilateral Distance:    Right Eye Near:   Left Eye Near:    Bilateral Near:     Physical Exam  Constitutional: She appears well-developed and well-nourished. No distress.  HENT:  Head: Normocephalic and atraumatic.  Mouth/Throat: Oropharynx is clear and moist.  Eyes: Pupils are equal, round, and reactive to light. Conjunctivae are normal.  Cardiovascular: Normal rate.  Pulmonary/Chest: Effort normal. No respiratory distress.  Abdominal: She exhibits no distension.  Musculoskeletal: She exhibits edema.  Both ankles have trace edema.  Patient is flat-footed.  Her right foot has pain with dorsiflexion and tenderness directly over the insertion of the plantar fascia into the medial heel.  She has an antalgic gait.  Neurological: She is alert.  Skin: Skin is warm and dry.     UC Treatments / Results  Labs (all labs ordered are listed, but only abnormal results are displayed) Labs Reviewed  GLUCOSE, CAPILLARY - Abnormal; Notable for the following components:      Result Value   Glucose-Capillary 113 (*)    All other components within normal limits  POCT URINALYSIS DIP (DEVICE) - Abnormal; Notable for the following components:   Leukocytes, UA TRACE (*)    All other components within normal limits    EKG None  Radiology No results found.  Procedures Procedures (including critical care time)  Medications Ordered in UC Medications - No data to display  Initial Impression / Assessment and Plan / UC Course  I have reviewed the triage vital signs and the nursing notes.  Pertinent labs & imaging results that were available during my care of the patient were reviewed by me and considered in my medical decision making (see chart for details).     Discussed with patient plantar fasciitis.  I gave her plantar fascia  information.  I explained this is not overuse injury.  She needs to change her shoewear, do stretching, use ice, and take anti-inflammatories.  She is to follow-up with podiatry if it persists. I explained that she does not have diabetes.  Her random blood sugar of 113 and a normal urinalysis would indicate no diabetes at this time. Her trace pedal edema is not unusual in a patient who is obese, eats a lot of salt, works on her feet all day long.  I do not see any disease process.  Her vital signs are stable.2 Final Clinical Impressions(s) / UC Diagnoses   Final diagnoses:  Plantar fasciitis, right  Urination frequency     Discharge Instructions     Ice to foot Stretching several times a day Take the prednisone as directed After the prednisone take Mobic 15 mg a day Make sure you wear good supportive shoes to work Follow-up with podiatry if fails to improve in 1 to 2 weeks   ED Prescriptions    Medication Sig Dispense Auth. Provider   methylPREDNISolone (MEDROL DOSEPAK) 4 MG TBPK tablet tad 21 tablet Raylene Everts, MD   meloxicam (MOBIC) 15 MG tablet Take 1 tablet (15 mg total) by mouth daily. 30 tablet Raylene Everts, MD     Controlled Substance Prescriptions [place] Controlled Substance Registry consulted? Not Applicable   Raylene Everts, MD 06/12/17 [phone]

## 2017-06-12 NOTE — Discharge Instructions (Addendum)
Ice to foot Stretching several times a day Take the prednisone as directed After the prednisone take Mobic 15 mg a day Make sure you wear good supportive shoes to work Follow-up with podiatry if fails to improve in 1 to 2 weeks

## 2018-01-18 ENCOUNTER — Ambulatory Visit (HOSPITAL_COMMUNITY): Admission: EM | Admit: 2018-01-18 | Discharge: 2018-01-18 | Discharge status: Against Medical Advice | Payer: Self-pay

## 2018-01-18 NOTE — ED Notes (Signed)
Sydney Harris, patient access reports patient left, has to go to work.

## 2018-01-21 ENCOUNTER — Encounter (HOSPITAL_COMMUNITY): Payer: Self-pay

## 2018-01-21 ENCOUNTER — Ambulatory Visit (HOSPITAL_COMMUNITY)
Admission: EM | Admit: 2018-01-21 | Discharge: 2018-01-21 | Disposition: A | Discharge status: Home / Self Care | Payer: Self-pay | Attending: Emergency Medicine | Admitting: Emergency Medicine

## 2018-01-21 DIAGNOSIS — Z90710 Acquired absence of both cervix and uterus: Secondary | ICD-10-CM | POA: Insufficient documentation

## 2018-01-21 DIAGNOSIS — Z87891 Personal history of nicotine dependence: Secondary | ICD-10-CM | POA: Insufficient documentation

## 2018-01-21 DIAGNOSIS — R300 Dysuria: Secondary | ICD-10-CM | POA: Insufficient documentation

## 2018-01-21 DIAGNOSIS — Z880 Allergy status to penicillin: Secondary | ICD-10-CM | POA: Insufficient documentation

## 2018-01-21 DIAGNOSIS — N898 Other specified noninflammatory disorders of vagina: Secondary | ICD-10-CM | POA: Insufficient documentation

## 2018-01-21 LAB — POCT URINALYSIS DIP (DEVICE)
BILIRUBIN URINE: NEGATIVE
GLUCOSE, UA: NEGATIVE mg/dL
Hgb urine dipstick: NEGATIVE
KETONES UR: NEGATIVE mg/dL
LEUKOCYTES UA: NEGATIVE
Nitrite: NEGATIVE
Protein, ur: NEGATIVE mg/dL
Specific Gravity, Urine: >=1.03 (ref 1.005–1.030)
Urobilinogen, UA: 0.2 mg/dL (ref 0.0–1.0)
pH: 5 (ref 5.0–8.0)

## 2018-01-21 MED ORDER — FLUCONAZOLE 150 MG PO TABS: 150.0000 mg | ORAL_TABLET | Freq: Once | ORAL | 0 refills | Status: AC

## 2018-01-21 NOTE — ED Triage Notes (Signed)
Pt presents with vaginal itching, burning, and discharge.

## 2018-01-21 NOTE — Discharge Instructions (Signed)
Symptoms concerning for yeast Please take 1 tablet of diflucan, may repeat in 2-3 days if still having symptoms We will send the swab off to confirm as well as to check for BV which could also cause your symptoms.  We will call you with results and provide any other medicines that may be needed.

## 2018-01-22 ENCOUNTER — Telehealth (HOSPITAL_COMMUNITY): Payer: Self-pay | Admitting: Emergency Medicine

## 2018-01-22 LAB — CERVICOVAGINAL ANCILLARY ONLY
BACTERIAL VAGINITIS: POSITIVE — AB
Candida vaginitis: NEGATIVE

## 2018-01-22 MED ORDER — METRONIDAZOLE 500 MG PO TABS
500.0000 mg | ORAL_TABLET | Freq: Two times a day (BID) | ORAL | 0 refills | Status: DC
Start: 2018-01-22 — End: 2018-03-29

## 2018-01-22 NOTE — ED Provider Notes (Signed)
[place]    CSN: [id number] Arrival date & time: 01/21/18  1330     History   Chief Complaint Chief Complaint  Patient presents with  . Vaginal Itching & Burning    HPI Sydney Harris is a 53 y.o. female no contributing past medical history presenting today for evaluation of vaginal discharge.  Patient states that over the past week she has developed vaginal discharge.  The vaginal discharge has had associated itching burning and irritation.  She denies increased urinary frequency, but has had dysuria.  She has had one partner, but would like to be checked for STDs as well.  Denies history of BV.  Has previous hysterectomy, no longer with menstrual cycles.  Denies abdominal pain.  Denies fever, nausea or vomiting.  She has noticed some mild spotting but believes this is related to frequent itching.  HPI  Past Medical History:  Diagnosis Date  . Bipolar disorder ([place])   . Blood transfusion 1981   "related to my daughter being born"  . Fibroid tumor   . Gout     Patient Active Problem List   Diagnosis Date Noted  . Tonsillitis 06/30/2013  . Streptococcal infection group A 06/30/2013  . Leukocytosis, unspecified 06/30/2013    Past Surgical History:  Procedure Laterality Date  . ABDOMINAL HYSTERECTOMY     "for fibroid tumors"    OB History   No obstetric history on file.      Home Medications    Prior to Admission medications   Medication Sig Start Date End Date Taking? Authorizing Provider  levocetirizine (XYZAL) 5 MG tablet Take 1 tablet (5 mg total) by mouth every evening. 06/25/16   Tereasa Coop, PA-C  meloxicam (MOBIC) 15 MG tablet Take 1 tablet (15 mg total) by mouth daily. 06/12/17   Raylene Everts, MD  methylPREDNISolone (MEDROL DOSEPAK) 4 MG TBPK tablet tad 06/12/17   Raylene Everts, MD  traZODone (DESYREL) 100 MG tablet Take 100 mg by mouth at bedtime.    [provider]    Family History Family History  Problem  Relation Age of Onset  . Diabetes Neg Hx   . Heart failure Neg Hx   . Cancer Neg Hx     Social History Social History   Tobacco Use  . Smoking status: Former Smoker    Packs/day: 0.12    Years: 33.00    Pack years: 3.96    Types: Cigarettes  . Smokeless tobacco: Never Used  Substance Use Topics  . Alcohol use: No  . Drug use: Yes    Types: "Crack" cocaine    Comment: 06/30/2013 "last used crack ~ 03/2013"     Allergies   Penicillins   Review of Systems Review of Systems  Constitutional: Negative for fever.  Respiratory: Negative for shortness of breath.   Cardiovascular: Negative for chest pain.  Gastrointestinal: Negative for abdominal pain, diarrhea, nausea and vomiting.  Genitourinary: Positive for dysuria and vaginal discharge. Negative for flank pain, genital sores, hematuria, menstrual problem, vaginal bleeding and vaginal pain.  Musculoskeletal: Negative for back pain.  Skin: Negative for rash.  Neurological: Negative for dizziness, light-headedness and headaches.     Physical Exam Triage Vital Signs ED Triage Vitals  Enc Vitals Group     BP 01/21/18 1427 136/81     Pulse Rate 01/21/18 1427 68     Resp 01/21/18 1427 20     Temp 01/21/18 1427 98.2 F (36.8 C)  Temp Source 01/21/18 1427 Oral     SpO2 01/21/18 1427 97 %     Weight --      Height --      Head Circumference --      Peak Flow --      Pain Score 01/21/18 1446 6     Pain Loc --      Pain Edu? --      Excl. in [place]? --    No data found.  Updated Vital Signs BP 136/81 (BP Location: Right Arm)   Pulse 68   Temp 98.2 F (36.8 C) (Oral)   Resp 20   LMP 12/28/2010   SpO2 97%   Visual Acuity Right Eye Distance:   Left Eye Distance:   Bilateral Distance:    Right Eye Near:   Left Eye Near:    Bilateral Near:     Physical Exam Vitals signs and nursing note reviewed.  Constitutional:      Appearance: She is well-developed.     Comments: No acute distress  HENT:     Head:  Normocephalic and atraumatic.     Nose: Nose normal.  Eyes:     Conjunctiva/sclera: Conjunctivae normal.  Neck:     Musculoskeletal: Neck supple.  Cardiovascular:     Rate and Rhythm: Normal rate.  Pulmonary:     Effort: Pulmonary effort is normal. No respiratory distress.  Abdominal:     General: There is no distension.  Genitourinary:    Comments: Normal external female genitalia, thicker white discharge present in vagina, cervix not visualized Musculoskeletal: Normal range of motion.  Skin:    General: Skin is warm and dry.  Neurological:     Mental Status: She is alert and oriented to person, place, and time.      UC Treatments / Results  Labs (all labs ordered are listed, but only abnormal results are displayed) Labs Reviewed  POCT URINALYSIS DIP (DEVICE)  CERVICOVAGINAL ANCILLARY ONLY    EKG None  Radiology No results found.  Procedures Procedures (including critical care time)  Medications Ordered in UC Medications - No data to display  Initial Impression / Assessment and Plan / UC Course  I have reviewed the triage vital signs and the nursing notes.  Pertinent labs & imaging results that were available during my care of the patient were reviewed by me and considered in my medical decision making (see chart for details).  Clinical Course as of Jan 23 952  Thu Jan 21, 2018  1458 pH: 5.0 [HW]    Clinical Course User Index [HW] Caralee Morea, Cary C, PA-C    Urine negative for signs of infection, swab obtained to send off to confirm yeast versus STDs.  Will initiate treatment with Diflucan today given itching and discharge.  We will continue to monitor symptoms,Discussed strict return precautions. Patient verbalized understanding and is agreeable with plan.  Final Clinical Impressions(s) / UC Diagnoses   Final diagnoses:  Vaginal discharge     Discharge Instructions     Symptoms concerning for yeast Please take 1 tablet of diflucan, may repeat in  2-3 days if still having symptoms We will send the swab off to confirm as well as to check for BV which could also cause your symptoms.  We will call you with results and provide any other medicines that may be needed.   ED Prescriptions    Medication Sig Dispense Auth. Provider   fluconazole (DIFLUCAN) 150 MG tablet Take 1 tablet (150  mg total) by mouth once for 1 dose. 2 tablet Zalmen Wrightsman C, PA-C     Controlled Substance Prescriptions  Controlled Substance Registry consulted? Not Applicable   Janith Lima, [place] 01/22/18 [phone]

## 2018-01-22 NOTE — Telephone Encounter (Signed)
Bacterial vaginosis is positive. This was not treated at the urgent care visit.  Flagyl 500 mg BID x 7 days #14 no refills sent to patients pharmacy of choice.    Pt contacted and made aware. All questions answered.

## 2018-03-29 ENCOUNTER — Encounter (HOSPITAL_COMMUNITY): Payer: Self-pay | Admitting: Emergency Medicine

## 2018-03-29 ENCOUNTER — Ambulatory Visit (HOSPITAL_COMMUNITY)
Admission: EM | Admit: 2018-03-29 | Discharge: 2018-03-29 | Disposition: A | Discharge status: Home / Self Care | Payer: Self-pay | Attending: Family Medicine | Admitting: Family Medicine

## 2018-03-29 ENCOUNTER — Ambulatory Visit (INDEPENDENT_AMBULATORY_CARE_PROVIDER_SITE_OTHER): Payer: Self-pay

## 2018-03-29 DIAGNOSIS — M25562 Pain in left knee: Secondary | ICD-10-CM

## 2018-03-29 MED ORDER — TRAMADOL HCL 50 MG PO TABS: 50.0000 mg | ORAL_TABLET | Freq: Two times a day (BID) | ORAL | 0 refills | Status: AC | PRN

## 2018-03-29 MED ORDER — HYDROCODONE-ACETAMINOPHEN 5-325 MG PO TABS
ORAL_TABLET | ORAL | Status: AC
  Filled 2018-03-29: qty 1

## 2018-03-29 MED ORDER — HYDROCODONE-ACETAMINOPHEN 5-325 MG PO TABS: 1.0000 | ORAL_TABLET | Freq: Once | ORAL | Status: DC

## 2018-03-29 MED ORDER — MELOXICAM 15 MG PO TABS: 15.0000 mg | ORAL_TABLET | Freq: Every day | ORAL | 1 refills | Status: DC

## 2018-03-29 NOTE — ED Triage Notes (Signed)
Pt sts left knee pain x 3 weeks; pt denies injury

## 2018-03-29 NOTE — ED Provider Notes (Signed)
[place]    CSN: [id number] Arrival date & time: 03/29/18  1217     History   Chief Complaint Chief Complaint  Patient presents with  . Knee Pain    HPI Sydney Harris is a 53 y.o. female.   Patient is a 53 year old female with past medical history of bipolar, gout.  She presents with approximately 3 weeks of left knee pain that is worsened.  The pain is worsened by standing, walking, bending.  She denies any injury today.  She has been taking oxycodone with relief of pain.  The pain is over the entire patella and radiating around to the posterior knee.  She denies any calf pain, swelling, erythema.  No history of DVT.  No recent traveling.  She has been told she had arthritis in the past.  No fevers, chills.  ROS per HPI      Past Medical History:  Diagnosis Date  . Bipolar disorder ([place])   . Blood transfusion 1981   "related to my daughter being born"  . Fibroid tumor   . Gout     Patient Active Problem List   Diagnosis Date Noted  . Tonsillitis 06/30/2013  . Streptococcal infection group A 06/30/2013  . Leukocytosis, unspecified 06/30/2013    Past Surgical History:  Procedure Laterality Date  . ABDOMINAL HYSTERECTOMY     "for fibroid tumors"    OB History   No obstetric history on file.      Home Medications    Prior to Admission medications   Medication Sig Start Date End Date Taking? Authorizing Provider  levocetirizine (XYZAL) 5 MG tablet Take 1 tablet (5 mg total) by mouth every evening. 06/25/16   Tereasa Coop, PA-C  meloxicam (MOBIC) 15 MG tablet Take 1 tablet (15 mg total) by mouth daily. 03/29/18   Loura Halt A, NP  traMADol (ULTRAM) 50 MG tablet Take 1 tablet (50 mg total) by mouth every 12 (twelve) hours as needed for up to 2 days. 03/29/18 03/31/18  Loura Halt A, NP  traZODone (DESYREL) 100 MG tablet Take 100 mg by mouth at bedtime.    [provider]    Family History Family History  Problem Relation Age of Onset   . Diabetes Neg Hx   . Heart failure Neg Hx   . Cancer Neg Hx     Social History Social History   Tobacco Use  . Smoking status: Former Smoker    Packs/day: 0.12    Years: 33.00    Pack years: 3.96    Types: Cigarettes  . Smokeless tobacco: Never Used  Substance Use Topics  . Alcohol use: No  . Drug use: Yes    Types: "Crack" cocaine    Comment: 06/30/2013 "last used crack ~ 03/2013"     Allergies   Penicillins   Review of Systems Review of Systems   Physical Exam Triage Vital Signs ED Triage Vitals [03/29/18 1249]  Enc Vitals Group     BP (!) 141/79     Pulse Rate 71     Resp 18     Temp 97.8 F (36.6 C)     Temp Source Temporal     SpO2 98 %     Weight      Height      Head Circumference      Peak Flow      Pain Score 8     Pain Loc      Pain Edu?  Excl. in [place]?    No data found.  Updated Vital Signs BP (!) 141/79 (BP Location: Right Arm)   Pulse 71   Temp 97.8 F (36.6 C) (Temporal)   Resp 18   LMP 12/28/2010   SpO2 98%   Visual Acuity Right Eye Distance:   Left Eye Distance:   Bilateral Distance:    Right Eye Near:   Left Eye Near:    Bilateral Near:     Physical Exam Vitals signs and nursing note reviewed.  Constitutional:      General: She is not in acute distress.    Appearance: Normal appearance. She is normal weight. She is not ill-appearing, toxic-appearing or diaphoretic.  HENT:     Head: Normocephalic and atraumatic.     Nose: Nose normal.  Eyes:     Conjunctiva/sclera: Conjunctivae normal.  Pulmonary:     Effort: Pulmonary effort is normal.  Musculoskeletal:        General: Tenderness present. No signs of injury.     Right lower leg: No edema.     Left lower leg: No edema.     Comments: Tenderness over the anterior and posterior left patella more on the medial.  No bruising, swelling or deformities. Able to flex and extend No laxity No calf swelling, tenderness or erythema  Skin:    General: Skin is warm and  dry.     Findings: No rash.  Neurological:     Mental Status: She is alert.  Psychiatric:        Mood and Affect: Mood normal.      UC Treatments / Results  Labs (all labs ordered are listed, but only abnormal results are displayed) Labs Reviewed - No data to display  EKG None  Radiology Dg Knee Complete 4 Views Left  Result Date: 03/29/2018 CLINICAL DATA:  Acute onset left knee pain and difficulty weight-bearing. No known injury. EXAM: LEFT KNEE - COMPLETE 4+ VIEW COMPARISON:  Plain films left knee 12/18/2016. FINDINGS: There is no acute bony or joint abnormality. Osteophytosis about the knee is most notable on the medial side and unchanged. There is also mild medial compartment joint space narrowing. No chondrocalcinosis. No joint effusion. IMPRESSION: No acute abnormality. No change in mild to moderate osteoarthritis most notable in the medial compartment. Electronically Signed   By: Inge Rise M.D.   On: 03/29/2018 14:47    Procedures Procedures (including critical care time)  Medications Ordered in UC Medications - No data to display  Initial Impression / Assessment and Plan / UC Course  I have reviewed the triage vital signs and the nursing notes.  Pertinent labs & imaging results that were available during my care of the patient were reviewed by me and considered in my medical decision making (see chart for details).     X ray revealed mild to moderate osteoarthritis Will treat with meloxicam  Rest, ice elevate.  Knee sleeve A few tramadol for worse pain She refused steroid and Toradol injection.  Final Clinical Impressions(s) / UC Diagnoses   Final diagnoses:  Acute pain of left knee     Discharge Instructions     Your x-ray revealed arthritis We will do meloxicam for pain and inflammation Take this daily with food Rest the knee, ice and elevate Knee sleeve  Follow-up with orthopedic for continued or worsening symptoms     ED Prescriptions      Medication Sig Dispense Auth. Provider   meloxicam (MOBIC) 15 MG tablet Take  1 tablet (15 mg total) by mouth daily. 30 tablet Parisa Pinela A, NP   traMADol (ULTRAM) 50 MG tablet Take 1 tablet (50 mg total) by mouth every 12 (twelve) hours as needed for up to 2 days. 4 tablet Loura Halt A, NP     Controlled Substance Prescriptions [place] Controlled Substance Registry consulted? Not Applicable   Orvan July, NP 03/30/18 [phone]

## 2018-03-29 NOTE — Discharge Instructions (Addendum)
Your x-ray revealed arthritis We will do meloxicam for pain and inflammation Take this daily with food Rest the knee, ice and elevate Knee sleeve  Follow-up with orthopedic for continued or worsening symptoms

## 2018-08-22 ENCOUNTER — Ambulatory Visit (HOSPITAL_COMMUNITY)
Admission: EM | Admit: 2018-08-22 | Discharge: 2018-08-22 | Disposition: A | Discharge status: Home / Self Care | Payer: Self-pay | Attending: Emergency Medicine | Admitting: Emergency Medicine

## 2018-08-22 ENCOUNTER — Other Ambulatory Visit: Payer: Self-pay

## 2018-08-22 ENCOUNTER — Encounter (HOSPITAL_COMMUNITY): Payer: Self-pay

## 2018-08-22 DIAGNOSIS — B373 Candidiasis of vulva and vagina: Secondary | ICD-10-CM | POA: Insufficient documentation

## 2018-08-22 DIAGNOSIS — B3731 Acute candidiasis of vulva and vagina: Secondary | ICD-10-CM

## 2018-08-22 MED ORDER — FLUCONAZOLE 150 MG PO TABS: 150.0000 mg | ORAL_TABLET | Freq: Once | ORAL | 0 refills | Status: AC

## 2018-08-22 NOTE — ED Triage Notes (Signed)
Pt presents with external and internal vaginal itching X 3 days.

## 2018-08-22 NOTE — ED Provider Notes (Signed)
[place]    CSN: [id number] Arrival date & time: 08/22/18  1000     History   Chief Complaint Chief Complaint  Patient presents with  . Vaginal itching    HPI Sydney Harris is a 53 y.o. female.   Patient presents with vaginal itching x3 days.  She denies vaginal discharge or dysuria.  She is sexually active with one partner in a monogamous relationship.  She has history of vaginal yeast infection and bacterial vaginosis in January 2020.  She denies pelvic pain, abdominal pain, fever, chills, nausea, vomiting.  History of hysterectomy.  Patient request STD testing today.    The history is provided by the patient.    Past Medical History:  Diagnosis Date  . Bipolar disorder ([place])   . Blood transfusion 1981   "related to my daughter being born"  . Fibroid tumor   . Gout     Patient Active Problem List   Diagnosis Date Noted  . Tonsillitis 06/30/2013  . Streptococcal infection group A 06/30/2013  . Leukocytosis, unspecified 06/30/2013    Past Surgical History:  Procedure Laterality Date  . ABDOMINAL HYSTERECTOMY     "for fibroid tumors"    OB History   No obstetric history on file.      Home Medications    Prior to Admission medications   Medication Sig Start Date End Date Taking? Authorizing Provider  fluconazole (DIFLUCAN) 150 MG tablet Take 1 tablet (150 mg total) by mouth once for 1 dose. May repeat in 3 days. 08/22/18 08/22/18  Sharion Balloon, NP  levocetirizine (XYZAL) 5 MG tablet Take 1 tablet (5 mg total) by mouth every evening. 06/25/16   Tereasa Coop, PA-C  meloxicam (MOBIC) 15 MG tablet Take 1 tablet (15 mg total) by mouth daily. 03/29/18   Loura Halt A, NP  traZODone (DESYREL) 100 MG tablet Take 100 mg by mouth at bedtime.    [provider]    Family History Family History  Problem Relation Age of Onset  . Diabetes Neg Hx   . Heart failure Neg Hx   . Cancer Neg Hx     Social History Social History   Tobacco Use  .  Smoking status: Former Smoker    Packs/day: 0.12    Years: 33.00    Pack years: 3.96    Types: Cigarettes  . Smokeless tobacco: Never Used  Substance Use Topics  . Alcohol use: No  . Drug use: Yes    Types: "Crack" cocaine    Comment: 06/30/2013 "last used crack ~ 03/2013"     Allergies   Penicillins   Review of Systems Review of Systems  Constitutional: Negative for chills and fever.  HENT: Negative for ear pain and sore throat.   Eyes: Negative for pain and visual disturbance.  Respiratory: Negative for cough and shortness of breath.   Cardiovascular: Negative for chest pain and palpitations.  Gastrointestinal: Negative for abdominal pain and vomiting.  Genitourinary: Negative for dysuria, flank pain, hematuria, pelvic pain, vaginal bleeding and vaginal discharge.  Musculoskeletal: Negative for arthralgias and back pain.  Skin: Negative for color change and rash.  Neurological: Negative for seizures and syncope.  All other systems reviewed and are negative.    Physical Exam Triage Vital Signs ED Triage Vitals  Enc Vitals Group     BP      Pulse      Resp      Temp      Temp  src      SpO2      Weight      Height      Head Circumference      Peak Flow      Pain Score      Pain Loc      Pain Edu?      Excl. in [place]?    No data found.  Updated Vital Signs BP 139/84 (BP Location: Right Arm)   Pulse 60   Temp 98.4 F (36.9 C) (Oral)   Resp 18   LMP 12/28/2010   SpO2 97%   Visual Acuity Right Eye Distance:   Left Eye Distance:   Bilateral Distance:    Right Eye Near:   Left Eye Near:    Bilateral Near:     Physical Exam Vitals signs and nursing note reviewed.  Constitutional:      General: She is not in acute distress.    Appearance: She is well-developed.  HENT:     Head: Normocephalic and atraumatic.  Eyes:     Conjunctiva/sclera: Conjunctivae normal.  Neck:     Musculoskeletal: Neck supple.  Cardiovascular:     Rate and Rhythm: Normal  rate and regular rhythm.     Heart sounds: No murmur.  Pulmonary:     Effort: Pulmonary effort is normal. No respiratory distress.     Breath sounds: Normal breath sounds.  Abdominal:     Palpations: Abdomen is soft.     Tenderness: There is no abdominal tenderness. There is no right CVA tenderness, left CVA tenderness, guarding or rebound.  Genitourinary:    Exam position: Lithotomy position.     Labia:        Right: No rash, tenderness or lesion.        Left: No rash, tenderness or lesion.      Vagina: Vaginal discharge present. No tenderness, bleeding or lesions.     Comments: Scant white vaginal discharge.   Skin:    General: Skin is warm and dry.  Neurological:     Mental Status: She is alert.      UC Treatments / Results  Labs (all labs ordered are listed, but only abnormal results are displayed) Labs Reviewed  CERVICOVAGINAL ANCILLARY ONLY    EKG   Radiology No results found.  Procedures Procedures (including critical care time)  Medications Ordered in UC Medications - No data to display  Initial Impression / Assessment and Plan / UC Course  I have reviewed the triage vital signs and the nursing notes.  Pertinent labs & imaging results that were available during my care of the patient were reviewed by me and considered in my medical decision making (see chart for details).   Vaginal candidiasis.  Treating with Diflucan.  STD swab performed and pending.  Declined proactive treatment for STDs today.  Instructed patient not to have sex until test results are back and that she and her partner may need treatment if her test results come back positive.  Instructed patient to return here or follow-up with her PCP if she develops vaginal discharge, abdominal pain, dysuria, back pain, pelvic pain, fever, chills, vomiting, diarrhea, or other symptoms.     Final Clinical Impressions(s) / UC Diagnoses   Final diagnoses:  Vaginal candidiasis     Discharge  Instructions     Take the Diflucan as prescribed.    Your STD test are pending.  Do not have sex until your test results are back.  If  your test results come back positive, you and your sexual partner may need treatment.  We will call you if needed.    Return here or follow-up with your PCP if you develop vaginal discharge, abdominal pain, difficulty with urination, back pain, fever, chills, vomiting, diarrhea, or other symptoms.        ED Prescriptions    Medication Sig Dispense Auth. Provider   fluconazole (DIFLUCAN) 150 MG tablet Take 1 tablet (150 mg total) by mouth once for 1 dose. May repeat in 3 days. 2 tablet Sharion Balloon, NP     Controlled Substance Prescriptions [place] Controlled Substance Registry consulted? Not Applicable   Sharion Balloon, NP 08/22/18 1040

## 2018-08-22 NOTE — Discharge Instructions (Addendum)
Take the Diflucan as prescribed.    Your STD test are pending.  Do not have sex until your test results are back.  If your test results come back positive, you and your sexual partner may need treatment.  We will call you if needed.    Return here or follow-up with your PCP if you develop vaginal discharge, abdominal pain, difficulty with urination, back pain, fever, chills, vomiting, diarrhea, or other symptoms.

## 2018-08-25 LAB — CERVICOVAGINAL ANCILLARY ONLY
Candida vaginitis: NEGATIVE
Chlamydia: NEGATIVE
Neisseria Gonorrhea: NEGATIVE
Trichomonas: NEGATIVE

## 2018-08-27 ENCOUNTER — Telehealth (HOSPITAL_COMMUNITY): Payer: Self-pay | Admitting: Emergency Medicine

## 2018-08-27 MED ORDER — METRONIDAZOLE 500 MG PO TABS: 500.0000 mg | ORAL_TABLET | Freq: Two times a day (BID) | ORAL | 0 refills | Status: AC

## 2018-08-27 NOTE — Telephone Encounter (Signed)
Bacterial vaginosis is positive. This was not treated at the urgent care visit.  Flagyl 500 mg BID x 7 days #14 no refills sent to patients pharmacy of choice.    Patient contacted and made aware of    results, all questions answered

## 2019-04-29 ENCOUNTER — Other Ambulatory Visit: Payer: Self-pay | Admitting: Nurse Practitioner

## 2019-04-29 DIAGNOSIS — Z1231 Encounter for screening mammogram for malignant neoplasm of breast: Secondary | ICD-10-CM

## 2019-05-02 ENCOUNTER — Other Ambulatory Visit: Payer: Self-pay

## 2019-05-02 ENCOUNTER — Ambulatory Visit
Admission: RE | Admit: 2019-05-02 | Discharge: 2019-05-02 | Disposition: A | Discharge status: Home / Self Care | Payer: Medicaid Other | Source: Ambulatory Visit | Attending: *Deleted | Admitting: *Deleted

## 2019-05-02 DIAGNOSIS — Z1231 Encounter for screening mammogram for malignant neoplasm of breast: Secondary | ICD-10-CM

## 2019-05-04 ENCOUNTER — Other Ambulatory Visit: Payer: Self-pay | Admitting: Nurse Practitioner

## 2019-05-04 ENCOUNTER — Other Ambulatory Visit: Payer: Self-pay | Admitting: *Deleted

## 2019-05-04 DIAGNOSIS — R928 Other abnormal and inconclusive findings on diagnostic imaging of breast: Secondary | ICD-10-CM

## 2019-05-11 ENCOUNTER — Other Ambulatory Visit: Payer: Self-pay | Admitting: Nurse Practitioner

## 2019-05-13 ENCOUNTER — Encounter: Payer: Self-pay | Admitting: Gastroenterology

## 2019-05-17 ENCOUNTER — Other Ambulatory Visit: Payer: Self-pay | Admitting: Family Medicine

## 2019-05-18 ENCOUNTER — Ambulatory Visit
Admission: RE | Admit: 2019-05-18 | Discharge: 2019-05-18 | Disposition: A | Discharge status: Home / Self Care | Payer: Medicaid Other | Source: Ambulatory Visit | Attending: *Deleted | Admitting: *Deleted

## 2019-05-18 ENCOUNTER — Other Ambulatory Visit: Payer: Self-pay

## 2019-05-18 ENCOUNTER — Other Ambulatory Visit: Payer: Self-pay | Admitting: Nurse Practitioner

## 2019-05-18 DIAGNOSIS — R928 Other abnormal and inconclusive findings on diagnostic imaging of breast: Secondary | ICD-10-CM

## 2019-05-27 ENCOUNTER — Ambulatory Visit: Payer: Medicaid Other | Admitting: Family Medicine

## 2019-05-31 ENCOUNTER — Encounter: Payer: Self-pay | Admitting: Family Medicine

## 2019-05-31 ENCOUNTER — Other Ambulatory Visit: Payer: Self-pay

## 2019-05-31 ENCOUNTER — Ambulatory Visit (INDEPENDENT_AMBULATORY_CARE_PROVIDER_SITE_OTHER): Payer: Medicaid Other | Admitting: Family Medicine

## 2019-05-31 DIAGNOSIS — M1712 Unilateral primary osteoarthritis, left knee: Secondary | ICD-10-CM | POA: Diagnosis not present

## 2019-05-31 DIAGNOSIS — M79605 Pain in left leg: Secondary | ICD-10-CM

## 2019-05-31 MED ORDER — BACLOFEN 10 MG PO TABS: 5.0000 mg | ORAL_TABLET | Freq: Three times a day (TID) | ORAL | 3 refills | Status: DC | PRN

## 2019-05-31 MED ORDER — GLUCOSAMINE SULFATE 1000 MG PO CAPS: 1.0000 | ORAL_CAPSULE | Freq: Two times a day (BID) | ORAL | 3 refills | Status: DC

## 2019-05-31 MED ORDER — CELECOXIB 200 MG PO CAPS: 200.0000 mg | ORAL_CAPSULE | Freq: Two times a day (BID) | ORAL | 6 refills | Status: DC | PRN

## 2019-05-31 NOTE — Progress Notes (Signed)
Office Visit Note   Patient: Sydney Harris           Date of Birth: 1966/01/06           MRN: [id number] Visit Date: 05/31/2019 Requested by: Sydney Medici, FNP [place],  [place] [id number] PCP: Sydney Medici, FNP  Subjective: Chief Complaint  Patient presents with  . Left Leg - Pain    Pain in whole leg x 2 months. Pain is worse all around her knee. No recent injury. Has had issues with that knee intermittently since her 17s - had fluid on that knee aspirated - "it was green."    HPI: She is here with left knee pain.  Symptoms started a couple months ago, no injury.  Severe pain on the medial side of her knee making her walk with a limp.  No locking or giving way.  She took some Vicodin which helped.  Then she took a 10 mg Percocet from a friend and that gave her pain relief for 3 days.  She has tried meloxicam but it does not help.  She has a history of osteoarthritis.  She has also tried a knee brace which gave temporary relief.  She is a recovering addict.               ROS:   All other systems were reviewed and are negative.  Objective: Vital Signs: LMP 12/28/2010   Physical Exam:  General:  Alert and oriented, in no acute distress. Pulm:  Breathing unlabored. Psy:  Normal mood, congruent affect. Skin: No rash or erythema Left knee: She has full extension and flexion of 120 degrees.  She has 1+ effusion with no warmth.  She is tender on the medial joint line and to palpation of the patellofemoral joint.  Unable to do ligamentous testing due to patient guarding.  Imaging: No results found.  Assessment & Plan: 1.  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 -She is not interested in cortisone injection. -Prescribed Celebrex and baclofen and recommended glucosamine.  Physical therapy referral as  well.  See her back as needed.     Procedures: No procedures performed  No notes on file     PMFS History: Patient Active Problem List   Diagnosis Date Noted  . Tonsillitis 06/30/2013  . Streptococcal infection group A 06/30/2013  . Leukocytosis, unspecified 06/30/2013   Past Medical History:  Diagnosis Date  . Bipolar disorder ([place])   . Blood transfusion 1981   "related to my daughter being born"  . Fibroid tumor   . Gout     Family History  Problem Relation Age of Onset  . Diabetes Neg Hx   . Heart failure Neg Hx   . Cancer Neg Hx     Past Surgical History:  Procedure Laterality Date  . ABDOMINAL HYSTERECTOMY     "for fibroid tumors"   Social History   Occupational History  . Not on file  Tobacco Use  . Smoking status: Former Smoker    Packs/day: 0.12    Years: 33.00    Pack years: 3.96    Types: Cigarettes  . Smokeless tobacco: Never Used  Substance and Sexual Activity  . Alcohol use: No  . Drug use: Yes    Types: "Crack" cocaine    Comment: 06/30/2013 "last  used crack ~ 03/2013"  . Sexual activity: Never

## 2019-06-14 ENCOUNTER — Encounter: Payer: Self-pay | Admitting: Gastroenterology

## 2019-06-14 ENCOUNTER — Ambulatory Visit: Payer: Medicaid Other | Admitting: Gastroenterology

## 2019-06-14 VITALS — BP 128/70 | HR 65 | Ht 63.0 in | Wt 230.0 lb

## 2019-06-14 DIAGNOSIS — Z1211 Encounter for screening for malignant neoplasm of colon: Secondary | ICD-10-CM | POA: Diagnosis not present

## 2019-06-14 MED ORDER — NA SULFATE-K SULFATE-MG SULF 17.5-3.13-1.6 GM/177ML PO SOLN: 1.0000 | Freq: Once | ORAL | 0 refills | Status: AC

## 2019-06-14 NOTE — Patient Instructions (Signed)
If you are age 56 or older, your body mass index should be between 23-30. Your Body mass index is 40.74 kg/m. If this is out of the aforementioned range listed, please consider follow up with your Primary Care Provider.  If you are age 53 or younger, your body mass index should be between 19-25. Your Body mass index is 40.74 kg/m. If this is out of the aformentioned range listed, please consider follow up with your Primary Care Provider.   You have been scheduled for a colonoscopy. Please follow written instructions given to you at your visit today.  Please pick up your prep supplies at the pharmacy within the next 1-3 days. If you use inhalers (even only as needed), please bring them with you on the day of your procedure.  It was a pleasure to see you today!  Dr. Loletha Carrow

## 2019-06-14 NOTE — Progress Notes (Signed)
[facility] Consult Note:  History: Sydney Harris 06/14/2019  Referring provider: Boyce Medici, FNP  Reason for consult/chief complaint: Colonoscopy (discuss scheduling colon)   Subjective  HPI:  This is a very pleasant 54 year old woman referred by primary care to discuss colon cancer screening.  She had been without medical care for years until she recently got Medicaid, then saw primary care and has been getting some health screening testing done.  She has no family history of colon cancer and no prior colon cancer screening.Sydney Harris denies chronic abdominal pain, altered bowel habits, rectal bleeding, dysphagia, odynophagia, nausea, vomiting, early satiety or weight loss.  She typically has 2 BMs per day.   ROS:  Review of Systems  Constitutional: Negative for appetite change and unexpected weight change.  HENT: Negative for mouth sores and voice change.   Eyes: Negative for pain and redness.  Respiratory: Negative for cough and shortness of breath.   Cardiovascular: Negative for chest pain and palpitations.  Genitourinary: Negative for dysuria and hematuria.  Musculoskeletal: Negative for arthralgias and myalgias.  Skin: Negative for pallor and rash.  Neurological: Negative for weakness and headaches.  Hematological: Negative for adenopathy.  Psychiatric/Behavioral:       Mood stable     Past Medical History: Past Medical History:  Diagnosis Date  . Arthritis of left knee   . Bipolar disorder ([place])   . Blood transfusion 1981   "related to my daughter being born"  . Fibroid tumor   . Gout   . Plantar fasciitis      Past Surgical History: Past Surgical History:  Procedure Laterality Date  . 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  Family History: Family  History  Problem Relation Age of Onset  . Diabetes Father   . Heart failure Neg Hx   . Cancer Neg Hx     Social History: Social History   Socioeconomic History  . Marital status: Single    Spouse name: Not on file  . Number of children: Not on file  . Years of education: Not on file  . Highest education level: Not on file  Occupational History  . Not on file  Tobacco Use  . Smoking status: Former Smoker    Packs/day: 0.12    Years: 33.00    Pack years: 3.96    Types: Cigarettes  . Smokeless tobacco: Never Used  Substance and Sexual Activity  . Alcohol use: No  . Drug use: Yes    Types: "Crack" cocaine    Comment: 06/30/2013 "last used crack ~ 03/2013"  . Sexual activity: Never  Other Topics Concern  . Not on file  Social History Narrative  . Not on file   Social Determinants of Health   Financial Resource Strain:   . Difficulty of Paying Living Expenses:   Food Insecurity:   . Worried About Charity fundraiser in the Last Year:   . Arboriculturist in the Last Year:   Transportation Needs:   . Film/video editor (Medical):   Marland Kitchen Lack of Transportation (Non-Medical):   Physical Activity:   . Days of Exercise per Week:   . Minutes of Exercise per Session:   Stress:   . Feeling of Stress :   Social Connections:   . Frequency of  Communication with Friends and Family:   . Frequency of Social Gatherings with Friends and Family:   . Attends Religious Services:   . Active Member of Clubs or Organizations:   . Attends Archivist Meetings:   Marland Kitchen Marital Status:     Allergies: Allergies  Allergen Reactions  . Penicillins Hives    Has patient had a PCN reaction causing immediate rash, facial/tongue/throat swelling, SOB or lightheadedness with hypotension: Yes Has patient had a PCN reaction causing severe rash involving mucus membranes or skin necrosis: Yes Has patient had a PCN reaction that required hospitalization No Has patient had a PCN reaction  occurring within the last 10 years: Yes If all of the above answers are "NO", then may proceed with Cephalosporin use.     Outpatient Meds: Current Outpatient Medications  Medication Sig Dispense Refill  . baclofen (LIORESAL) 10 MG tablet Take 0.5-1 tablets (5-10 mg total) by mouth 3 (three) times daily as needed for muscle spasms. 30 each 3  . celecoxib (CELEBREX) 200 MG capsule Take 1 capsule (200 mg total) by mouth 2 (two) times daily as needed. 60 capsule 6  . Glucosamine Sulfate 1000 MG CAPS Take 1 capsule (1,000 mg total) by mouth 2 (two) times daily. 180 capsule 3  . lisinopril-hydrochlorothiazide (ZESTORETIC) 10-12.5 MG tablet Take 1 tablet by mouth daily.    . traZODone (DESYREL) 100 MG tablet Take 100 mg by mouth at bedtime.     No current facility-administered medications for this visit.      ___________________________________________________________________ Objective   Exam:  BP 128/70   Pulse 65   Ht 5\' 3"  (1.6 m)   Wt 230 lb (104.3 kg)   LMP 12/28/2010   SpO2 98%   BMI 40.74 kg/m    General: Well-appearing, pleasant and conversational  Eyes: sclera anicteric, no redness  ENT: oral mucosa moist without lesions, no cervical or supraclavicular lymphadenopathy  CV: RRR without murmur, S1/S2, no JVD, no peripheral edema  Resp: clear to auscultation bilaterally, normal RR and effort noted  GI: soft, no tenderness, with active bowel sounds. No guarding or palpable organomegaly noted, limited by body habitus.  Obese, long midline scar without hernia  Skin; warm and dry, no rash or jaundice noted  Neuro: awake, alert and oriented x 3. Normal gross motor function and fluent speech  Labs:  None  Radiologic Studies:  None (No primary care records accompany the referral) Assessment: Encounter Diagnosis  Name Primary?  . Special screening for malignant neoplasms, colon Yes    Average risk for colorectal cancer.  We discussed the importance of screening  and options of colonoscopy versus Cologuard.  I discussed the risks and benefits of both, and she would like to proceed with colonoscopy.  She had requested the office visit because of anxiety over this whole process, but felt much better by the end of our discussion.  Diagrams of the anatomy was shown, the entire process of preparation and procedure were described in detail along with risks and benefits and she was agreeable.  The benefits and risks of the planned procedure were described in detail with the patient or (when appropriate) their health care proxy.  Risks were outlined as including, but not limited to, bleeding, infection, perforation, adverse medication reaction leading to cardiac or pulmonary decompensation, pancreatitis (if ERCP).  The limitation of incomplete mucosal visualization was also discussed.  No guarantees or warranties were given.   Thank you for the courtesy of this consult.  Please call  me with any questions or concerns.  Nelida Meuse III  CC: Referring provider noted above

## 2019-08-09 ENCOUNTER — Encounter: Payer: Medicaid Other | Admitting: Gastroenterology

## 2019-08-31 ENCOUNTER — Ambulatory Visit (AMBULATORY_SURGERY_CENTER): Payer: Self-pay

## 2019-08-31 ENCOUNTER — Other Ambulatory Visit: Payer: Self-pay

## 2019-08-31 VITALS — Ht 63.0 in | Wt 232.4 lb

## 2019-08-31 DIAGNOSIS — Z1211 Encounter for screening for malignant neoplasm of colon: Secondary | ICD-10-CM

## 2019-08-31 MED ORDER — NA SULFATE-K SULFATE-MG SULF 17.5-3.13-1.6 GM/177ML PO SOLN
1.0000 | Freq: Once | ORAL | 0 refills | Status: AC
Start: 2019-08-31 — End: 2019-08-31

## 2019-08-31 NOTE — Progress Notes (Signed)
No allergies to soy or egg Pt is not on blood thinners or diet pills Denies issues with sedation/intubation Denies atrial flutter/fib Denies constipation   Emmi instructions given to pt  Pt is aware of Covid safety and care partner requirements.   States she is on other medications for mental health. Denies that they are Iron, blood thinners, or diabetic medications.   Requested she call with the other ones not in her chart or bring the list the day she comes for the procedure. Pt stated she would call with the names of the meds.

## 2019-09-08 ENCOUNTER — Telehealth: Payer: Self-pay | Admitting: Gastroenterology
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
## 2019-09-09 ENCOUNTER — Encounter: Payer: Medicaid Other | Admitting: Gastroenterology

## 2019-09-09 NOTE — Telephone Encounter (Signed)
Thank you.  Kristen,    Please have Bruce contact this patient in a few weeks to reschedule colonoscopy when patient feels ready to do so.  - HD

## 2019-09-09 NOTE — Telephone Encounter (Signed)
Noted.  I will send a message to myself to reach out to the pt around September 1st to reschedule.

## 2019-11-05 ENCOUNTER — Other Ambulatory Visit: Payer: Self-pay

## 2019-11-05 ENCOUNTER — Ambulatory Visit: Payer: Medicaid Other | Admitting: Primary Care

## 2019-11-05 VITALS — BP 138/86 | HR 64 | Resp 16

## 2019-11-05 DIAGNOSIS — Z760 Encounter for issue of repeat prescription: Secondary | ICD-10-CM | POA: Diagnosis not present

## 2019-11-05 DIAGNOSIS — M1990 Unspecified osteoarthritis, unspecified site: Secondary | ICD-10-CM | POA: Diagnosis not present

## 2019-11-05 DIAGNOSIS — I10 Essential (primary) hypertension: Secondary | ICD-10-CM | POA: Diagnosis not present

## 2019-11-05 DIAGNOSIS — R059 Cough, unspecified: Secondary | ICD-10-CM

## 2019-11-05 MED ORDER — LISINOPRIL-HYDROCHLOROTHIAZIDE 10-12.5 MG PO TABS: 1.0000 | ORAL_TABLET | Freq: Every day | ORAL | 0 refills | Status: DC

## 2019-11-05 MED ORDER — BENZONATATE 200 MG PO CAPS: 200.0000 mg | ORAL_CAPSULE | Freq: Two times a day (BID) | ORAL | 0 refills | Status: DC | PRN

## 2019-11-05 NOTE — Progress Notes (Signed)
New Patient Office Visit  Subjective:  Patient ID: [name], female    DOB: Mar 15, 1965  Age: 54 y.o. MRN: [id number]  CC:  Chief Complaint  Patient presents with  . Medication Refill    HPI Ms Eulala Newcombe is a 54 year old for presents for management of Hypertension and she is out of medication. She will be establishing care with Dr. Joya Gaskins in December. She complains of shoulder to ankle pain arthritis takes oxy denied refill f/u with PCP. She also complains of a cough productive and non. Past Medical History:  Diagnosis Date  . Anxiety   . Arthritis of left knee   . Bipolar disorder ([place])   . Blood transfusion 1981   "related to my daughter being born"  . Depression   . Fibroid tumor   . Gout   . Hypertension   . Plantar fasciitis   . Substance abuse ([place])    crack cocaine  14 yrs sober.    Past Surgical History:  Procedure Laterality Date  . ABDOMINAL HYSTERECTOMY     "for fibroid tumors"    Family History  Problem Relation Age of Onset  . Diabetes Father   . Heart failure Neg Hx   . Cancer Neg Hx   . Colon polyps Neg Hx   . Colon cancer Neg Hx   . Esophageal cancer Neg Hx   . Rectal cancer Neg Hx   . Stomach cancer Neg Hx     Social History   Socioeconomic History  . Marital status: Single    Spouse name: Not on file  . Number of children: Not on file  . Years of education: Not on file  . Highest education level: Not on file  Occupational History  . Not on file  Tobacco Use  . Smoking status: Former Smoker    Packs/day: 0.12    Years: 33.00    Pack years: 3.96    Types: Cigarettes  . Smokeless tobacco: Never Used  Vaping Use  . Vaping Use: Never used  Substance and Sexual Activity  . Alcohol use: No  . Drug use: Yes    Types: "Crack" cocaine    Comment: 06/30/2013 "last used crack ~ 03/2013"  . Sexual activity: Never  Other Topics Concern  . Not on file  Social History Narrative  . Not on file   Social Determinants  of Health   Financial Resource Strain:   . Difficulty of Paying Living Expenses: Not on file  Food Insecurity:   . Worried About Charity fundraiser in the Last Year: Not on file  . Ran Out of Food in the Last Year: Not on file  Transportation Needs:   . Lack of Transportation (Medical): Not on file  . Lack of Transportation (Non-Medical): Not on file  Physical Activity:   . Days of Exercise per Week: Not on file  . Minutes of Exercise per Session: Not on file  Stress:   . Feeling of Stress : Not on file  Social Connections:   . Frequency of Communication with Friends and Family: Not on file  . Frequency of Social Gatherings with Friends and Family: Not on file  . Attends Religious Services: Not on file  . Active Member of Clubs or Organizations: Not on file  . Attends Archivist Meetings: Not on file  . Marital Status: Not on file  Intimate Partner Violence:   . Fear of Current or Ex-Partner: Not on file  .  Emotionally Abused: Not on file  . Physically Abused: Not on file  . Sexually Abused: Not on file    ROS Review of Systems  Musculoskeletal: Positive for arthralgias.       Left side pain from shoulder to knees currently on "oxy"  All other systems reviewed and are negative.   Objective:   Today's Vitals: BP 138/86   Pulse 64   Resp 16   LMP 12/28/2010   SpO2 95%   Physical Exam Vitals reviewed.  Constitutional:      Appearance: She is obese.  HENT:     Head: Normocephalic.     Nose: Nose normal.  Cardiovascular:     Rate and Rhythm: Normal rate and regular rhythm.  Pulmonary:     Effort: Pulmonary effort is normal.     Breath sounds: Normal breath sounds.  Abdominal:     General: Bowel sounds are normal. There is distension.     Palpations: Abdomen is soft.  Musculoskeletal:        General: Normal range of motion.     Cervical back: Normal range of motion.  Skin:    General: Skin is warm and dry.  Neurological:     Mental Status: She is  alert and oriented to person, place, and time.  Psychiatric:        Mood and Affect: Mood normal.        Behavior: Behavior normal.        Thought Content: Thought content normal.        Judgment: Judgment normal.     Assessment & Plan:  Frank was seen today for medication refill.  Diagnoses and all orders for this visit:  Essential hypertension Counseled on blood pressure goal of less than 130/80, low-sodium, DASH diet, medication compliance, 150 minutes of moderate intensity exercise per week. Discussed medication compliance, adverse effects.  Cough Chronic cough for 3 month productive/non sent in benzonatate Lungs clear to auscultation   -     benzonatate (TESSALON) 200 MG capsule; Take 1 capsule (200 mg total) by mouth 2 (two) times daily as needed for cough.  Arthritis Ortho followed denied oxycodone explained she has Celebrex on file from Dr. Junius Roads just needs to ask for refills. Work on losing weight to help reduce joint pain. May alternate with heat and ice application for pain relief.Dr. Junius Roads prescribed Celebrex 200mg  twice daily has refills . Other alternatives include massage, acupuncture and water aerobics.  You must stay active and avoid a sedentary lifestyle.  Medication refill -     lisinopril-hydrochlorothiazide (ZESTORETIC) 10-12.5 MG tablet; Take 1 tablet by mouth daily.       Outpatient Encounter Medications as of 11/05/2019  Medication Sig  . lisinopril-hydrochlorothiazide (ZESTORETIC) 10-12.5 MG tablet Take 1 tablet by mouth daily.  . traZODone (DESYREL) 100 MG tablet Take 100 mg by mouth at bedtime.  . [DISCONTINUED] lisinopril-hydrochlorothiazide (ZESTORETIC) 10-12.5 MG tablet Take 1 tablet by mouth daily.  . baclofen (LIORESAL) 10 MG tablet Take 0.5-1 tablets (5-10 mg total) by mouth 3 (three) times daily as needed for muscle spasms. (Patient not taking: Reported on 08/31/2019)  . benzonatate (TESSALON) 200 MG capsule Take 1 capsule (200 mg total) by  mouth 2 (two) times daily as needed for cough.  . celecoxib (CELEBREX) 200 MG capsule Take 1 capsule (200 mg total) by mouth 2 (two) times daily as needed. (Patient not taking: Reported on 08/31/2019)  . Glucosamine Sulfate 1000 MG CAPS Take 1 capsule (1,000 mg total)  by mouth 2 (two) times daily. (Patient not taking: Reported on 08/31/2019)   No facility-administered encounter medications on file as of 11/05/2019.    Follow-up: Return for PCP scheduled .   Kerin Perna, NP

## 2019-11-28 ENCOUNTER — Encounter (HOSPITAL_COMMUNITY): Payer: Self-pay

## 2019-11-28 ENCOUNTER — Other Ambulatory Visit: Payer: Self-pay

## 2019-11-28 ENCOUNTER — Ambulatory Visit (HOSPITAL_COMMUNITY)
Admission: EM | Admit: 2019-11-28 | Discharge: 2019-11-28 | Disposition: A | Discharge status: Home / Self Care | Payer: Medicaid Other | Attending: Internal Medicine | Admitting: Internal Medicine

## 2019-11-28 ENCOUNTER — Ambulatory Visit (INDEPENDENT_AMBULATORY_CARE_PROVIDER_SITE_OTHER): Payer: Medicaid Other

## 2019-11-28 DIAGNOSIS — R059 Cough, unspecified: Secondary | ICD-10-CM | POA: Diagnosis not present

## 2019-11-28 DIAGNOSIS — R053 Chronic cough: Secondary | ICD-10-CM

## 2019-11-28 MED ORDER — PREDNISONE 10 MG (21) PO TBPK
ORAL_TABLET | Freq: Every day | ORAL | 0 refills | Status: DC
Start: 2019-11-28 — End: 2019-12-21

## 2019-11-28 MED ORDER — HYDROCODONE-HOMATROPINE 5-1.5 MG/5ML PO SYRP: 5.0000 mL | ORAL_SOLUTION | Freq: Four times a day (QID) | ORAL | 0 refills | Status: DC | PRN

## 2019-11-28 NOTE — ED Triage Notes (Signed)
Pt presents with congestion and productive cough X 2 months.

## 2019-11-28 NOTE — Discharge Instructions (Signed)
Please take medication as prescribed If your symptoms are persistent, you will need a pulmonologist referral for further management.

## 2019-11-29 NOTE — ED Provider Notes (Signed)
[facility]    CSN: [id number] Arrival date & time: 11/28/19  1237      History   Chief Complaint Chief Complaint  Patient presents with  . URI    HPI Sydney Harris is a 54 y.o. female patient comes to the urgent care with complaints of nonproductive cough and nasal congestion of 2 months duration.  Patient says symptoms started insidiously and has been persistent.  She denies any weight loss, night sweats or chest pain.  No wheezing.  Patient denies history of smoking or secondhand smoke exposure.  No nausea, vomiting.  Coughing is independent of the patient's location and happens both during the day and at nighttime.  Patient has taken [employer] and several over-the-counter medications with no improvement in her symptoms.  At this time the cough is severe enough and is disturbing her sleep pattern.   No environmental exposures or fumes.  HPI  Past Medical History:  Diagnosis Date  . Anxiety   . Arthritis of left knee   . Bipolar disorder ([place])   . Blood transfusion 1981   "related to my daughter being born"  . Depression   . Fibroid tumor   . Gout   . Hypertension   . Plantar fasciitis   . Substance abuse ([place])    crack cocaine  14 yrs sober.    Patient Active Problem List   Diagnosis Date Noted  . Tonsillitis 06/30/2013  . Streptococcal infection group A 06/30/2013  . Leukocytosis, unspecified 06/30/2013    Past Surgical History:  Procedure Laterality Date  . ABDOMINAL HYSTERECTOMY     "for fibroid tumors"    OB History   No obstetric history on file.      Home Medications    Prior to Admission medications   Medication Sig Start Date End Date Taking? Authorizing Provider  benzonatate (TESSALON) 200 MG capsule Take 1 capsule (200 mg total) by mouth 2 (two) times daily as needed for cough. 11/05/19   Kerin Perna, NP  HYDROcodone-homatropine (HYCODAN) 5-1.5 MG/5ML syrup Take 5 mLs by mouth every 6 (six) hours as needed for  cough. 11/28/19   LampteyMyrene Galas, MD  lisinopril-hydrochlorothiazide (ZESTORETIC) 10-12.5 MG tablet Take 1 tablet by mouth daily. 11/05/19   Kerin Perna, NP  predniSONE (STERAPRED UNI-PAK 21 TAB) 10 MG (21) TBPK tablet Take by mouth daily. Take 6 tabs by mouth daily  for 2 days, then 5 tabs for 2 days, then 4 tabs for 2 days, then 3 tabs for 2 days, 2 tabs for 2 days, then 1 tab by mouth daily for 2 days 11/28/19   Chase Picket, MD  traZODone (DESYREL) 100 MG tablet Take 100 mg by mouth at bedtime.    [provider]    Family History Family History  Problem Relation Age of Onset  . Diabetes Father   . Heart failure Neg Hx   . Cancer Neg Hx   . Colon polyps Neg Hx   . Colon cancer Neg Hx   . Esophageal cancer Neg Hx   . Rectal cancer Neg Hx   . Stomach cancer Neg Hx     Social History Social History   Tobacco Use  . Smoking status: Former Smoker    Packs/day: 0.12    Years: 33.00    Pack years: 3.96    Types: Cigarettes  . Smokeless tobacco: Never Used  Vaping Use  . Vaping Use: Never used  Substance Use Topics  .  Alcohol use: No  . Drug use: Yes    Types: "Crack" cocaine    Comment: 06/30/2013 "last used crack ~ 03/2013"     Allergies   Penicillins   Review of Systems Review of Systems  Constitutional: Negative.   HENT: Negative.   Respiratory: Positive for cough. Negative for chest tightness, shortness of breath and wheezing.   Cardiovascular: Negative for chest pain, palpitations and leg swelling.  Gastrointestinal: Negative.   Genitourinary: Negative.   Musculoskeletal: Negative.   Neurological: Negative.      Physical Exam Triage Vital Signs ED Triage Vitals  Enc Vitals Group     BP 11/28/19 1502 (!) 133/104     Pulse Rate 11/28/19 1502 73     Resp 11/28/19 1502 16     Temp 11/28/19 1502 98.4 F (36.9 C)     Temp Source 11/28/19 1502 Oral     SpO2 11/28/19 1502 100 %     Weight --      Height --      Head Circumference --       Peak Flow --      Pain Score 11/28/19 1500 5     Pain Loc --      Pain Edu? --      Excl. in [place]? --    No data found.  Updated Vital Signs BP (!) 133/104 (BP Location: Right Arm)   Pulse 73   Temp 98.4 F (36.9 C) (Oral)   Resp 16   LMP 12/28/2010   SpO2 100%   Visual Acuity Right Eye Distance:   Left Eye Distance:   Bilateral Distance:    Right Eye Near:   Left Eye Near:    Bilateral Near:     Physical Exam Vitals and nursing note reviewed.  Constitutional:      General: She is not in acute distress.    Appearance: She is obese. She is not ill-appearing.  Cardiovascular:     Rate and Rhythm: Normal rate and regular rhythm.     Pulses: Normal pulses.     Heart sounds: Normal heart sounds.  Pulmonary:     Effort: Pulmonary effort is normal. No respiratory distress.     Breath sounds: Normal breath sounds. No wheezing or rales.  Abdominal:     General: Abdomen is flat. There is no distension.     Palpations: There is no mass.     Hernia: No hernia is present.  Musculoskeletal:        General: Normal range of motion.  Neurological:     Mental Status: She is alert.      UC Treatments / Results  Labs (all labs ordered are listed, but only abnormal results are displayed) Labs Reviewed - No data to display  EKG   Radiology DG Chest 2 View  Result Date: 11/28/2019 CLINICAL DATA:  54 year old female with persistent cough for 2 months. EXAM: CHEST - 2 VIEW COMPARISON:  Chest radiographs 02/08/2016 and earlier or. FINDINGS: Chronic curvilinear scarring in the left lower lobe is unchanged since 2018 radiographs. Mildly larger lung volumes now. Stable cardiac and mediastinal contours, cardiac size at the upper limits of normal. Visualized tracheal air column is within normal limits. No pneumothorax, pulmonary edema, pleural effusion or acute pulmonary opacity. No acute osseous abnormality identified. Negative visible bowel gas pattern. IMPRESSION: 1. No acute  cardiopulmonary abnormality. 2. Chronic scarring in the left lower lobe. Electronically Signed   By: Genevie Ann M.D.   On:  11/28/2019 15:52    Procedures Procedures (including critical care time)  Medications Ordered in UC Medications - No data to display  Initial Impression / Assessment and Plan / UC Course  I have reviewed the triage vital signs and the nursing notes.  Pertinent labs & imaging results that were available during my care of the patient were reviewed by me and considered in my medical decision making (see chart for details).     1.  Chronic cough: Chest x-ray is negative for acute lung infiltrate Tapering dose of prednisone. Hycodan as needed for cough If patient symptoms persist she would need to consult with a pulmonologist. Return precautions given. Final Clinical Impressions(s) / UC Diagnoses   Final diagnoses:  Chronic cough     Discharge Instructions     Please take medication as prescribed If your symptoms are persistent, you will need a pulmonologist referral for further management.   ED Prescriptions    Medication Sig Dispense Auth. Provider   HYDROcodone-homatropine (HYCODAN) 5-1.5 MG/5ML syrup Take 5 mLs by mouth every 6 (six) hours as needed for cough. 120 mL Amilio Zehnder, Myrene Galas, MD   predniSONE (STERAPRED UNI-PAK 21 TAB) 10 MG (21) TBPK tablet Take by mouth daily. Take 6 tabs by mouth daily  for 2 days, then 5 tabs for 2 days, then 4 tabs for 2 days, then 3 tabs for 2 days, 2 tabs for 2 days, then 1 tab by mouth daily for 2 days 42 tablet Acey [place], Myrene Galas, MD     PDMP not reviewed this encounter.   Chase Picket, MD 11/29/19 1454

## 2019-12-21 ENCOUNTER — Other Ambulatory Visit: Payer: Self-pay

## 2019-12-21 ENCOUNTER — Ambulatory Visit: Payer: Medicaid Other | Attending: Critical Care Medicine | Admitting: Critical Care Medicine

## 2019-12-21 ENCOUNTER — Encounter: Payer: Self-pay | Admitting: Critical Care Medicine

## 2019-12-21 VITALS — BP 109/76 | HR 77 | Temp 98.0°F | Ht 63.0 in | Wt 227.0 lb

## 2019-12-21 DIAGNOSIS — Z131 Encounter for screening for diabetes mellitus: Secondary | ICD-10-CM

## 2019-12-21 DIAGNOSIS — R053 Chronic cough: Secondary | ICD-10-CM | POA: Diagnosis present

## 2019-12-21 DIAGNOSIS — F319 Bipolar disorder, unspecified: Secondary | ICD-10-CM | POA: Diagnosis not present

## 2019-12-21 DIAGNOSIS — Z1211 Encounter for screening for malignant neoplasm of colon: Secondary | ICD-10-CM

## 2019-12-21 DIAGNOSIS — N649 Disorder of breast, unspecified: Secondary | ICD-10-CM | POA: Insufficient documentation

## 2019-12-21 DIAGNOSIS — Z1231 Encounter for screening mammogram for malignant neoplasm of breast: Secondary | ICD-10-CM

## 2019-12-21 DIAGNOSIS — E1165 Type 2 diabetes mellitus with hyperglycemia: Secondary | ICD-10-CM | POA: Diagnosis not present

## 2019-12-21 DIAGNOSIS — Z87891 Personal history of nicotine dependence: Secondary | ICD-10-CM | POA: Insufficient documentation

## 2019-12-21 DIAGNOSIS — Z1159 Encounter for screening for other viral diseases: Secondary | ICD-10-CM

## 2019-12-21 DIAGNOSIS — G8929 Other chronic pain: Secondary | ICD-10-CM | POA: Diagnosis not present

## 2019-12-21 DIAGNOSIS — F209 Schizophrenia, unspecified: Secondary | ICD-10-CM | POA: Diagnosis not present

## 2019-12-21 DIAGNOSIS — M25562 Pain in left knee: Secondary | ICD-10-CM | POA: Diagnosis not present

## 2019-12-21 LAB — GLUCOSE, POCT (MANUAL RESULT ENTRY): POC Glucose: 116 mg/dl — AB (ref 70–99)

## 2019-12-21 LAB — POCT GLYCOSYLATED HEMOGLOBIN (HGB A1C): Hemoglobin A1C: 6.5 % — AB (ref 4.0–5.6)

## 2019-12-21 MED ORDER — LOSARTAN POTASSIUM-HCTZ 50-12.5 MG PO TABS: 1.0000 | ORAL_TABLET | Freq: Every day | ORAL | 3 refills | Status: DC

## 2019-12-21 MED ORDER — TRUE METRIX METER W/DEVICE KIT: PACK | 0 refills | Status: DC

## 2019-12-21 MED ORDER — TRUEPLUS LANCETS 28G MISC: 1 refills | Status: DC

## 2019-12-21 MED ORDER — PROMETHAZINE-DM 6.25-15 MG/5ML PO SYRP: 5.0000 mL | ORAL_SOLUTION | Freq: Four times a day (QID) | ORAL | 0 refills | Status: DC | PRN

## 2019-12-21 MED ORDER — METFORMIN HCL 500 MG PO TABS: 500.0000 mg | ORAL_TABLET | Freq: Every day | ORAL | 3 refills | Status: DC

## 2019-12-21 MED ORDER — TRUE METRIX BLOOD GLUCOSE TEST VI STRP: ORAL_STRIP | 12 refills | Status: DC

## 2019-12-21 NOTE — Assessment & Plan Note (Signed)
New onset diabetes discerned  Plan for this patient will be to begin Metformin 500 mg daily patient given a diabetic diet and glucose testing supplies

## 2019-12-21 NOTE — Progress Notes (Signed)
Subjective:    Patient ID: Sydney Harris, female    DOB: 26-Apr-1965, 54 y.o.   MRN: [id number]  54 y.o.F here to est PCP    This patient was formally followed by another primary care provider in the region but has not been seen recently.  Patient did go to the urgent care recently with a chronic cough for 2 months given cough suppressants and prednisone with minimal improvement.  Note the patient is on an ACE inhibitor.  On arrival A1c is 6.5 blood glucose greater than 100.  Note prior diagnoses of bipolar disorder and schizophrenia and she is followed by [facility] and is on multiple mental health medications.  The patient has multiple primary care gaps  She has received her Covid vaccine the last one in mid June.  Patient needs a tetanus shot but wants to hold off on this she is already received her flu vaccine recently she is in need of hepatitis C screening colonoscopy and Pap smear  The patient has no other specific complaints except for severe left knee pain this been a concern for some time  Cough This is a new problem. The current episode started more than 1 month ago. The problem has been unchanged. The problem occurs constantly. The cough is non-productive. Associated symptoms include nasal congestion, postnasal drip and rhinorrhea. Pertinent negatives include no chest pain, ear congestion, ear pain, fever, headaches, heartburn, hemoptysis, rash, sore throat, shortness of breath or wheezing. She has tried prescription cough suppressant and oral steroids for the symptoms. The treatment provided significant relief. There is no history of asthma, bronchiectasis, bronchitis, emphysema, environmental allergies or pneumonia.   Past Medical History:  Diagnosis Date  . Anxiety   . Arthritis of left knee   . Bipolar disorder ([place])   . Blood transfusion 1981   "related to my daughter being born"  . Depression   . Fibroid tumor   . Gout   . Hypertension   . Plantar fasciitis   .  Substance abuse ([place])    crack cocaine  14 yrs sober.     Family History  Problem Relation Age of Onset  . Diabetes Father   . Heart failure Neg Hx   . Cancer Neg Hx   . Colon polyps Neg Hx   . Colon cancer Neg Hx   . Esophageal cancer Neg Hx   . Rectal cancer Neg Hx   . Stomach cancer Neg Hx      Social History   Socioeconomic History  . Marital status: Single    Spouse name: Not on file  . Number of children: Not on file  . Years of education: Not on file  . Highest education level: Not on file  Occupational History  . Not on file  Tobacco Use  . Smoking status: Former Smoker    Packs/day: 0.12    Years: 33.00    Pack years: 3.96    Types: Cigarettes  . Smokeless tobacco: Never Used  Vaping Use  . Vaping Use: Never used  Substance and Sexual Activity  . Alcohol use: No  . Drug use: Yes    Types: "Crack" cocaine, Marijuana    Comment: 06/30/2013 "last used crack ~ 03/2013"  . Sexual activity: Never  Other Topics Concern  . Not on file  Social History Narrative  . Not on file   Social Determinants of Health   Financial Resource Strain:   . Difficulty of Paying Living Expenses: Not on file  Food Insecurity:   . Worried About Charity fundraiser in the Last Year: Not on file  . Ran Out of Food in the Last Year: Not on file  Transportation Needs:   . Lack of Transportation (Medical): Not on file  . Lack of Transportation (Non-Medical): Not on file  Physical Activity:   . Days of Exercise per Week: Not on file  . Minutes of Exercise per Session: Not on file  Stress:   . Feeling of Stress : Not on file  Social Connections:   . Frequency of Communication with Friends and Family: Not on file  . Frequency of Social Gatherings with Friends and Family: Not on file  . Attends Religious Services: Not on file  . Active Member of Clubs or Organizations: Not on file  . Attends Archivist Meetings: Not on file  . Marital Status: Not on file  Intimate  Partner Violence:   . Fear of Current or Ex-Partner: Not on file  . Emotionally Abused: Not on file  . Physically Abused: Not on file  . Sexually Abused: Not on file     Allergies  Allergen Reactions  . Penicillins Hives    Has patient had a PCN reaction causing immediate rash, facial/tongue/throat swelling, SOB or lightheadedness with hypotension: Yes Has patient had a PCN reaction causing severe rash involving mucus membranes or skin necrosis: Yes Has patient had a PCN reaction that required hospitalization No Has patient had a PCN reaction occurring within the last 10 years: Yes If all of the above answers are "NO", then may proceed with Cephalosporin use.      Outpatient Medications Prior to Visit  Medication Sig Dispense Refill  . ARIPiprazole (ABILIFY) 15 MG tablet Take 15 mg by mouth at bedtime.    . benztropine (COGENTIN) 0.5 MG tablet Take 0.5 mg by mouth at bedtime.     Marland Kitchen escitalopram (LEXAPRO) 5 MG tablet Take 5 mg by mouth daily.    . prazosin (MINIPRESS) 1 MG capsule Take 1 mg by mouth at bedtime.    . traZODone (DESYREL) 100 MG tablet Take 100 mg by mouth at bedtime as needed. Take 1 & 1/2 tablets by mouth every night as needed    . benzonatate (TESSALON) 200 MG capsule Take 1 capsule (200 mg total) by mouth 2 (two) times daily as needed for cough. 20 capsule 0  . HYDROcodone-homatropine (HYCODAN) 5-1.5 MG/5ML syrup Take 5 mLs by mouth every 6 (six) hours as needed for cough. 120 mL 0  . lisinopril-hydrochlorothiazide (ZESTORETIC) 10-12.5 MG tablet Take 1 tablet by mouth daily. 90 tablet 0  . predniSONE (STERAPRED UNI-PAK 21 TAB) 10 MG (21) TBPK tablet Take by mouth daily. Take 6 tabs by mouth daily  for 2 days, then 5 tabs for 2 days, then 4 tabs for 2 days, then 3 tabs for 2 days, 2 tabs for 2 days, then 1 tab by mouth daily for 2 days 42 tablet 0   No facility-administered medications prior to visit.     Review of Systems  Constitutional: Negative for fever.   HENT: Positive for postnasal drip and rhinorrhea. Negative for ear pain and sore throat.   Respiratory: Positive for cough. Negative for hemoptysis, shortness of breath and wheezing.   Cardiovascular: Negative for chest pain.  Gastrointestinal: Negative for heartburn.  Musculoskeletal: Positive for joint swelling.       Left knee pain  Skin: Negative for rash.  Allergic/Immunologic: Negative for environmental allergies.  Neurological:  Negative for headaches.       Objective:   Physical Exam Vitals:   12/21/19 1023  BP: 109/76  Pulse: 77  Temp: 98 F (36.7 C)  SpO2: 92%  Weight: 227 lb (103 kg)  Height: 5' 3"  (1.6 m)    Gen: Pleasant, well-nourished, in no distress,  normal affect  ENT: No lesions,  mouth clear,  oropharynx clear, no postnasal drip  Neck: No JVD, no TMG, no carotid bruits  Lungs: No use of accessory muscles, no dullness to percussion, clear without rales or rhonchi  Cardiovascular: RRR, heart sounds normal, no murmur or gallops, no peripheral edema  Abdomen: soft and NT, no HSM,  BS normal  Musculoskeletal: Extreme tenderness on the left knee both medial and lateral aspects limited range of motion some mild effusion seen  Neuro: alert, non focal  Skin: Warm, no lesions or rashes  No results found.        Assessment & Plan:  I personally reviewed all images and lab data in the [facility] system as well as any outside material available during this office visit and agree with the  radiology impressions.   Type 2 diabetes mellitus with hyperglycemia, without long-term current use of insulin ([place]) New onset diabetes discerned  Plan for this patient will be to begin Metformin 500 mg daily patient given a diabetic diet and glucose testing supplies  Chronic cough Chronic cough likely on the basis of ACE inhibitor  Change Zestoretic to losartan HCT 50/12.5  Give promethazine dextromethorphan for cough suppression  Chronic pain of left knee Referral  made to orthopedics   Sydney Harris was seen today for establish care.  Diagnoses and all orders for this visit:  Type 2 diabetes mellitus with hyperglycemia, without long-term current use of insulin ([facility]) -     Comprehensive metabolic panel -     CBC with Differential/Platelet -     Lipid panel -     Microalbumin / creatinine urine ratio  Screening for diabetes mellitus (DM) -     POCT glucose (manual entry) -     POCT glycosylated hemoglobin (Hb A1C)  Chronic pain of left knee -     Ambulatory referral to Orthopedic Surgery  Encounter for screening mammogram for malignant neoplasm of breast -     MM Digital Diagnostic Unilat R; Future  Need for hepatitis C screening test -     HCV Ab w Reflex to Quant PCR  Colon cancer screening -     Ambulatory referral to Gastroenterology  Chronic cough  Other orders -     losartan-hydrochlorothiazide (HYZAAR) 50-12.5 MG tablet; Take 1 tablet by mouth daily. -     promethazine-dextromethorphan (PROMETHAZINE-DM) 6.25-15 MG/5ML syrup; Take 5 mLs by mouth 4 (four) times daily as needed for cough. -     metFORMIN (GLUCOPHAGE) 500 MG tablet; Take 1 tablet (500 mg total) by mouth daily with breakfast. -     Blood Glucose Monitoring Suppl (TRUE METRIX METER) w/Device KIT; Use to measure blood sugar twice a day -     TRUEplus Lancets 28G MISC; Use to measure blood sugar twice a day -     glucose blood (TRUE METRIX BLOOD GLUCOSE TEST) test strip; Use as instructed   Will receive Tdap at next visit is already fully vaccinated otherwise  Will receive follow-up mammogram to further explore abnormality seen previously 6 months ago in the right breast  Hepatitis C screen will be obtained refer to gastroenterology for colonoscopy

## 2019-12-21 NOTE — Assessment & Plan Note (Signed)
Chronic cough likely on the basis of ACE inhibitor  Change Zestoretic to losartan HCT 50/12.5  Give promethazine dextromethorphan for cough suppression

## 2019-12-21 NOTE — Progress Notes (Signed)
Constant cough  Pain in left knee, running up back

## 2019-12-21 NOTE — Assessment & Plan Note (Signed)
Referral made to orthopedics

## 2019-12-21 NOTE — Patient Instructions (Addendum)
You have elevated blood sugar and elevated A1c therefore you are developing diabetes, you will need to start Metformin 1 tablet daily at breakfast take it with meals  We will issue you a glucose meter to check your blood sugars   An appointment with Lurena Joiner our clinical pharmacist will be made so he can go over diabetic education with you  Follow a diet as outlined below to help you lose weight and to control your blood sugars better  Increase your activity levels walking 3-4 times weekly for 30 minutes to help increase her metabolism  Stop Zestoretic and begin losartan HCT 1 daily for your blood pressure, for Zestoretic likely is the cause of your chronic cough  Use promethazine dextromethorphan cough syrup as needed for the cough  A repeat diagnostic mammogram will be ordered of your right breast at the breast center to follow-up on the one performed 6 months ago  Due to the severe nature of the arthritis in your left knee I am referring you to orthopedics  You declined the tetanus shot today we will need to obtain this at a future date  We are to be checking labs today including metabolic panel blood counts lipid panel and hepatitis C levels  An appointment with one of our colleagues here will be made for you to obtain a Pap smear but she will return to Dr. Joya Gaskins for your primary care  Keep your appointments with [facility] for your [facility] health care  Return to see Dr. Joya Gaskins in 1 month   Diabetes Mellitus and Nutrition, Adult When you have diabetes (diabetes mellitus), it is very important to have healthy eating habits because your blood sugar (glucose) levels are greatly affected by what you eat and drink. Eating healthy foods in the appropriate amounts, at about the same times every day, can help you:  Control your blood glucose.  Lower your risk of heart disease.  Improve your blood pressure.  Reach or maintain a healthy weight. Every person with diabetes is different, and  each person has different needs for a meal plan. Your health care provider may recommend that you work with a diet and nutrition specialist (dietitian) to make a meal plan that is best for you. Your meal plan may vary depending on factors such as:  The calories you need.  The medicines you take.  Your weight.  Your blood glucose, blood pressure, and cholesterol levels.  Your activity level.  Other health conditions you have, such as heart or kidney disease. How do carbohydrates affect me? Carbohydrates, also called carbs, affect your blood glucose level more than any other type of food. Eating carbs naturally raises the amount of glucose in your blood. Carb counting is a method for keeping track of how many carbs you eat. Counting carbs is important to keep your blood glucose at a healthy level, especially if you use insulin or take certain oral diabetes medicines. It is important to know how many carbs you can safely have in each meal. This is different for every person. Your dietitian can help you calculate how many carbs you should have at each meal and for each snack. Foods that contain carbs include:  Bread, cereal, rice, pasta, and crackers.  Potatoes and corn.  Peas, beans, and lentils.  Milk and yogurt.  Fruit and juice.  Desserts, such as cakes, cookies, ice cream, and candy. How does alcohol affect me? Alcohol can cause a sudden decrease in blood glucose (hypoglycemia), especially if you use insulin  or take certain oral diabetes medicines. Hypoglycemia can be a life-threatening condition. Symptoms of hypoglycemia (sleepiness, dizziness, and confusion) are similar to symptoms of having too much alcohol. If your health care provider says that alcohol is safe for you, follow these guidelines:  Limit alcohol intake to no more than 1 drink per day for nonpregnant women and 2 drinks per day for men. One drink equals 12 oz of beer, 5 oz of wine, or 1 oz of hard liquor.  Do not  drink on an empty stomach.  Keep yourself hydrated with water, diet soda, or unsweetened iced tea.  Keep in mind that regular soda, juice, and other mixers may contain a lot of sugar and must be counted as carbs. What are tips for following this plan?  Reading food labels  Start by checking the serving size on the "Nutrition Facts" label of packaged foods and drinks. The amount of calories, carbs, fats, and other nutrients listed on the label is based on one serving of the item. Many items contain more than one serving per package.  Check the total grams (g) of carbs in one serving. You can calculate the number of servings of carbs in one serving by dividing the total carbs by 15. For example, if a food has 30 g of total carbs, it would be equal to 2 servings of carbs.  Check the number of grams (g) of saturated and trans fats in one serving. Choose foods that have low or no amount of these fats.  Check the number of milligrams (mg) of salt (sodium) in one serving. Most people should limit total sodium intake to less than 2,300 mg per day.  Always check the nutrition information of foods labeled as "low-fat" or "nonfat". These foods may be higher in added sugar or refined carbs and should be avoided.  Talk to your dietitian to identify your daily goals for nutrients listed on the label. Shopping  Avoid buying canned, premade, or processed foods. These foods tend to be high in fat, sodium, and added sugar.  Shop around the outside edge of the grocery store. This includes fresh fruits and vegetables, bulk grains, fresh meats, and fresh dairy. Cooking  Use low-heat cooking methods, such as baking, instead of high-heat cooking methods like deep frying.  Cook using healthy oils, such as olive, canola, or sunflower oil.  Avoid cooking with butter, cream, or high-fat meats. Meal planning  Eat meals and snacks regularly, preferably at the same times every day. Avoid going long periods of  time without eating.  Eat foods high in fiber, such as fresh fruits, vegetables, beans, and whole grains. Talk to your dietitian about how many servings of carbs you can eat at each meal.  Eat 4-6 ounces (oz) of lean protein each day, such as lean meat, chicken, fish, eggs, or tofu. One oz of lean protein is equal to: ? 1 oz of meat, chicken, or fish. ? 1 egg. ?  cup of tofu.  Eat some foods each day that contain healthy fats, such as avocado, nuts, seeds, and fish. Lifestyle  Check your blood glucose regularly.  Exercise regularly as told by your health care provider. This may include: ? 150 minutes of moderate-intensity or vigorous-intensity exercise each week. This could be brisk walking, biking, or water aerobics. ? Stretching and doing strength exercises, such as yoga or weightlifting, at least 2 times a week.  Take medicines as told by your health care provider.  Do not use any products  that contain nicotine or tobacco, such as cigarettes and e-cigarettes. If you need help quitting, ask your health care provider.  Work with a Social worker or diabetes educator to identify strategies to manage stress and any emotional and social challenges. Questions to ask a health care provider  Do I need to meet with a diabetes educator?  Do I need to meet with a dietitian?  What number can I call if I have questions?  When are the best times to check my blood glucose? Where to find more information:  American Diabetes Association: diabetes.org  Academy of Nutrition and Dietetics: www.eatright.[employer] of Diabetes and Digestive and Kidney Diseases (NIH): [employer] Summary  A healthy meal plan will help you control your blood glucose and maintain a healthy lifestyle.  Working with a diet and nutrition specialist (dietitian) can help you make a meal plan that is best for you.  Keep in mind that carbohydrates (carbs) and alcohol have immediate effects on your  blood glucose levels. It is important to count carbs and to use alcohol carefully. This information is not intended to replace advice given to you by your health care provider. Make sure you discuss any questions you have with your health care provider. Document Revised: 12/19/2016 Document Reviewed: 02/11/2016 Elsevier Patient Education  2020 [employer].

## 2019-12-22 ENCOUNTER — Other Ambulatory Visit: Payer: Self-pay | Admitting: Critical Care Medicine

## 2019-12-22 DIAGNOSIS — E1169 Type 2 diabetes mellitus with other specified complication: Secondary | ICD-10-CM | POA: Insufficient documentation

## 2019-12-22 DIAGNOSIS — E785 Hyperlipidemia, unspecified: Secondary | ICD-10-CM | POA: Insufficient documentation

## 2019-12-22 LAB — CBC WITH DIFFERENTIAL/PLATELET
Basophils Absolute: 0 10*3/uL (ref 0.0–0.2)
Basos: 1 %
EOS (ABSOLUTE): 0.2 10*3/uL (ref 0.0–0.4)
Eos: 2 %
Hematocrit: 43.2 % (ref 34.0–46.6)
Hemoglobin: 14.5 g/dL (ref 11.1–15.9)
Immature Grans (Abs): 0 10*3/uL (ref 0.0–0.1)
Immature Granulocytes: 0 %
Lymphocytes Absolute: 2.7 10*3/uL (ref 0.7–3.1)
Lymphs: 41 %
MCH: 29.1 pg (ref 26.6–33.0)
MCHC: 33.6 g/dL (ref 31.5–35.7)
MCV: 87 fL (ref 79–97)
Monocytes Absolute: 0.5 10*3/uL (ref 0.1–0.9)
Monocytes: 8 %
Neutrophils Absolute: 3.2 10*3/uL (ref 1.4–7.0)
Neutrophils: 48 %
Platelets: 349 10*3/uL (ref 150–450)
RBC: 4.99 x10E6/uL (ref 3.77–5.28)
RDW: 12.9 % (ref 11.7–15.4)
WBC: 6.6 10*3/uL (ref 3.4–10.8)

## 2019-12-22 LAB — COMPREHENSIVE METABOLIC PANEL
ALT: 19 IU/L (ref 0–32)
AST: 16 IU/L (ref 0–40)
Albumin/Globulin Ratio: 1.8 (ref 1.2–2.2)
Albumin: 4.9 g/dL (ref 3.8–4.9)
Alkaline Phosphatase: 64 IU/L (ref 44–121)
BUN/Creatinine Ratio: 22 (ref 9–23)
BUN: 21 mg/dL (ref 6–24)
Bilirubin Total: 0.3 mg/dL (ref 0.0–1.2)
CO2: 24 mmol/L (ref 20–29)
Calcium: 10.8 mg/dL — ABNORMAL HIGH (ref 8.7–10.2)
Chloride: 102 mmol/L (ref 96–106)
Creatinine, Ser: 0.95 mg/dL (ref 0.57–1.00)
GFR calc Af Amer: 77 mL/min/{1.73_m2} (ref >59)
GFR calc non Af Amer: 67 mL/min/{1.73_m2} (ref >59)
Globulin, Total: 2.7 g/dL (ref 1.5–4.5)
Glucose: 97 mg/dL (ref 65–99)
Potassium: 4.2 mmol/L (ref 3.5–5.2)
Sodium: 140 mmol/L (ref 134–144)
Total Protein: 7.6 g/dL (ref 6.0–8.5)

## 2019-12-22 LAB — LIPID PANEL
Chol/HDL Ratio: 4 ratio (ref 0.0–4.4)
Cholesterol, Total: 261 mg/dL — ABNORMAL HIGH (ref 100–199)
HDL: 65 mg/dL (ref >39)
LDL Chol Calc (NIH): 174 mg/dL — ABNORMAL HIGH (ref 0–99)
Triglycerides: 122 mg/dL (ref 0–149)
VLDL Cholesterol Cal: 22 mg/dL (ref 5–40)

## 2019-12-22 LAB — MICROALBUMIN / CREATININE URINE RATIO
Creatinine, Urine: 183 mg/dL
Microalb/Creat Ratio: 8 mg/g creat (ref 0–29)
Microalbumin, Urine: 14.5 ug/mL

## 2019-12-22 LAB — HCV INTERPRETATION

## 2019-12-22 LAB — HCV AB W REFLEX TO QUANT PCR: HCV Ab: <0.1 s/co ratio (ref 0.0–0.9)

## 2019-12-22 MED ORDER — ATORVASTATIN CALCIUM 10 MG PO TABS: 10.0000 mg | ORAL_TABLET | Freq: Every day | ORAL | 1 refills | Status: DC

## 2019-12-26 ENCOUNTER — Other Ambulatory Visit: Payer: Self-pay | Admitting: Pharmacist

## 2019-12-26 DIAGNOSIS — E1165 Type 2 diabetes mellitus with hyperglycemia: Secondary | ICD-10-CM

## 2019-12-26 MED ORDER — ACCU-CHEK GUIDE VI STRP: ORAL_STRIP | 6 refills | Status: DC

## 2019-12-26 MED ORDER — ACCU-CHEK GUIDE ME W/DEVICE KIT: PACK | 0 refills | Status: DC

## 2019-12-26 MED ORDER — ACCU-CHEK SOFTCLIX LANCETS MISC: 6 refills | Status: DC

## 2019-12-27 ENCOUNTER — Institutional Professional Consult (permissible substitution): Payer: Medicaid Other | Admitting: Internal Medicine

## 2019-12-27 NOTE — Progress Notes (Deleted)
Sydney Harris    [id number]    1965/04/08  Primary Care Physician:Wright, Burnett Harry, MD  Referring Physician: Boyce Medici, [address] [place],  [place] [address] Reason for Consultation: *** Date of Consultation: 12/27/2019  Chief complaint:  No chief complaint on file.    HPI:  *** Social history:  Occupation: Exposures: Smoking history:  Social History   Occupational History  . Not on file  Tobacco Use  . Smoking status: Former Smoker    Packs/day: 0.12    Years: 33.00    Pack years: 3.96    Types: Cigarettes  . Smokeless tobacco: Never Used  Vaping Use  . Vaping Use: Never used  Substance and Sexual Activity  . Alcohol use: No  . Drug use: Yes    Types: "Crack" cocaine, Marijuana    Comment: 06/30/2013 "last used crack ~ 03/2013"  . Sexual activity: Never    Relevant family history: *** Family History  Problem Relation Age of Onset  . Diabetes Father   . Heart failure Neg Hx   . Cancer Neg Hx   . Colon polyps Neg Hx   . Colon cancer Neg Hx   . Esophageal cancer Neg Hx   . Rectal cancer Neg Hx   . Stomach cancer Neg Hx     Past Medical History:  Diagnosis Date  . Anxiety   . Arthritis of left knee   . Bipolar disorder ([place])   . Blood transfusion 1981   "related to my daughter being born"  . Depression   . Fibroid tumor   . Gout   . Hypertension   . Plantar fasciitis   . Substance abuse ([place])    crack cocaine  14 yrs sober.    Past Surgical History:  Procedure Laterality Date  . ABDOMINAL HYSTERECTOMY     "for fibroid tumors"     Physical Exam: Last menstrual period 12/28/2010. Gen:      No acute distress ENT:  ***no nasal polyps, mucus membranes moist Lungs:    No increased respiratory effort, symmetric chest wall excursion, clear to auscultation bilaterally, ***no wheezes or crackles CV:         Regular rate and rhythm; no murmurs, rubs, or gallops.  No pedal edema Abd:      + bowel sounds; soft,  non-tender; no distension MSK: no acute synovitis of DIP or PIP joints, no mechanics hands.  Skin:      Warm and dry; no rashes*** Neuro: normal speech, no focal facial asymmetry Psych: alert and oriented x3, normal mood and affect   Data Reviewed/Medical Decision Making:  Independent interpretation of tests: Imaging: . Review of patient's *** images revealed ***. The patient's images have been independently reviewed by me.    PFTs: I have personally reviewed the patient's PFTs and *** No flowsheet data found.  Labs: ***  Immunization status:  Immunization History  Administered Date(s) Administered  . Moderna SARS-COVID-2 Vaccination 06/04/2019, 07/02/2019    . I reviewed prior external note(s) from *** . I reviewed the result(s) of the labs and imaging as noted above.  . I have ordered ***  Discussion of management or test interpretation with another colleague ***.   Assessment:  ***. 1 or more chronic illnesses with severe exacerbation, progression, or side effects of treatment;  ***. 1 acute or chronic illness or injury that poses a threat to life or bodily function  Plan/Recommendations:   We discussed  disease management and progression at length today.   I spent *** minutes in the care of this patient today including pre-charting, chart review, review of results, face-to-face care, coordination of care and communication with consultants etc.).  99202  15-29 minutes or Straightforward MDM  59136 30-44 minutes or Low level MDM  85992 45-59 minutes or Moderate level MDM  34144 60-74 minutes or High level MDM   Return to Care: No follow-ups on file.  Lenice Llamas, MD [facility] [place]  CC: Odem, Coolidge Breeze, FNP

## 2019-12-28 ENCOUNTER — Ambulatory Visit (INDEPENDENT_AMBULATORY_CARE_PROVIDER_SITE_OTHER): Payer: Medicaid Other | Admitting: Orthopaedic Surgery

## 2019-12-28 ENCOUNTER — Encounter: Payer: Self-pay | Admitting: Orthopaedic Surgery

## 2019-12-28 ENCOUNTER — Other Ambulatory Visit: Payer: Self-pay

## 2019-12-28 ENCOUNTER — Ambulatory Visit (INDEPENDENT_AMBULATORY_CARE_PROVIDER_SITE_OTHER): Payer: Medicaid Other

## 2019-12-28 VITALS — Ht 63.0 in | Wt 224.2 lb

## 2019-12-28 DIAGNOSIS — M1712 Unilateral primary osteoarthritis, left knee: Secondary | ICD-10-CM | POA: Diagnosis not present

## 2019-12-28 DIAGNOSIS — G8929 Other chronic pain: Secondary | ICD-10-CM

## 2019-12-28 DIAGNOSIS — M545 Low back pain, unspecified: Secondary | ICD-10-CM

## 2019-12-28 NOTE — Progress Notes (Signed)
Office Visit Note   Patient: Sydney Harris           Date of Birth: 04/18/1965           MRN: [id number] Visit Date: 12/28/2019              Requested by: Elsie Stain, MD [address],  [place] [address] PCP: Elsie Stain, MD   Assessment & Plan: Visit Diagnoses:  1. Primary osteoarthritis of left knee   2. Chronic bilateral low back pain without sciatica     Plan: Impression is moderately severe left knee DJD and chronic low back pain.  We will defer back to the PCP for pain medications.  For the knee since she has failed conservative treatments she is interested in pursuing a total knee replacement in the near future.  She will get things in order with her daughter so that she can have the appropriate postoperative care at home.  She will continue to make all efforts at weight loss.  Based on our discussion she is ready to move forward with a total knee replacement.  Risk benefits rehab recovery reviewed with the patient.  She denies a history of nickel allergy or DVT.  Follow-Up Instructions: Return if symptoms worsen or fail to improve.   Orders:  Orders Placed This Encounter  Procedures  . XR KNEE 3 VIEW LEFT  . XR Lumbar Spine 2-3 Views   No orders of the defined types were placed in this encounter.     Procedures: No procedures performed   Clinical Data: No additional findings.   Subjective: Chief Complaint  Patient presents with  . Left Knee - Pain  . Lower Back - Pain    Patient is a very pleasant 54 year old female who comes in for evaluation of chronic left knee pain and mild low back pain.  She is on disability for chronic low back pain.  She is a diabetic with a recent A1c of 6.5.  She has had chronic issues with her left knee and had it aspirated and injected with steroids in the past which helped only temporarily.  She takes oxycodone for the back pain which is prescribed to her by Dr. Joya Gaskins.  She has been doing physical  therapy for the back.  Her left knee has been bothersome for a while and she is also working on losing weight to help improve the pain.   Review of Systems  Constitutional: Negative.   HENT: Negative.   Eyes: Negative.   Respiratory: Negative.   Cardiovascular: Negative.   Endocrine: Negative.   Musculoskeletal: Negative.   Neurological: Negative.   Hematological: Negative.   Psychiatric/Behavioral: Negative.   All other systems reviewed and are negative.    Objective: Vital Signs: Ht 5\' 3"  (1.6 m)   Wt 224 lb 3.2 oz (101.7 kg)   LMP 12/28/2010   BMI 39.72 kg/m   Physical Exam Vitals and nursing note reviewed.  Constitutional:      Appearance: She is well-developed.  Pulmonary:     Effort: Pulmonary effort is normal.  Skin:    General: Skin is warm.     Capillary Refill: Capillary refill takes less than 2 seconds.  Neurological:     Mental Status: She is alert and oriented to person, place, and time.  Psychiatric:        Behavior: Behavior normal.        Thought Content: Thought content normal.  Judgment: Judgment normal.     Ortho Exam Left knee shows no joint effusion.  Mild limitation range of motion with mild patellofemoral crepitus and pain.  Collaterals and cruciates are stable.  Low back exam shows mild tenderness along the spinous processes.  Normal range of motion with pain.  Nonfocal exam of the lower extremities. Specialty Comments:  No specialty comments available.  Imaging: XR KNEE 3 VIEW LEFT  Result Date: 12/28/2019 Moderately severe tricompartmental DJD.  Periarticular spurring.  XR Lumbar Spine 2-3 Views  Result Date: 12/28/2019 Preservation of lumbar lordosis.  Mild lumbar spondylosis.  No significant degenerative disc disease.    PMFS History: Patient Active Problem List   Diagnosis Date Noted  . Chronic bilateral low back pain without sciatica 12/28/2019  . Hyperlipidemia associated with type 2 diabetes mellitus ([place])  12/22/2019  . Chronic cough 12/21/2019  . Chronic pain of left knee 12/21/2019  . Type 2 diabetes mellitus with hyperglycemia, without long-term current use of insulin ([place]) 12/21/2019   Past Medical History:  Diagnosis Date  . Anxiety   . Arthritis of left knee   . Bipolar disorder ([place])   . Blood transfusion 1981   "related to my daughter being born"  . Depression   . Fibroid tumor   . Gout   . Hypertension   . Plantar fasciitis   . Substance abuse ([place])    crack cocaine  14 yrs sober.    Family History  Problem Relation Age of Onset  . Diabetes Father   . Heart failure Neg Hx   . Cancer Neg Hx   . Colon polyps Neg Hx   . Colon cancer Neg Hx   . Esophageal cancer Neg Hx   . Rectal cancer Neg Hx   . Stomach cancer Neg Hx     Past Surgical History:  Procedure Laterality Date  . ABDOMINAL HYSTERECTOMY     "for fibroid tumors"   Social History   Occupational History  . Not on file  Tobacco Use  . Smoking status: Former Smoker    Packs/day: 0.12    Years: 33.00    Pack years: 3.96    Types: Cigarettes  . Smokeless tobacco: Never Used  Vaping Use  . Vaping Use: Never used  Substance and Sexual Activity  . Alcohol use: No  . Drug use: Yes    Types: "Crack" cocaine, Marijuana    Comment: 06/30/2013 "last used crack ~ 03/2013"  . Sexual activity: Never

## 2019-12-29 ENCOUNTER — Telehealth: Payer: Self-pay | Admitting: Critical Care Medicine

## 2019-12-29 DIAGNOSIS — M25562 Pain in left knee: Secondary | ICD-10-CM

## 2019-12-29 DIAGNOSIS — G8929 Other chronic pain: Secondary | ICD-10-CM

## 2019-12-29 NOTE — Telephone Encounter (Addendum)
Pt see orthopaedic doctor dr xu,naiping yesterday for her left knee pain and per pt her orthopaedic doctor told her to ask her pcp to prescribed oxycodone for her pain. Pt does not want to have surgery yet. [facility] [address] Pt said is she needs an appt with dr Joya Gaskins she will make one

## 2019-12-30 ENCOUNTER — Telehealth: Payer: Self-pay | Admitting: *Deleted

## 2019-12-30 ENCOUNTER — Other Ambulatory Visit: Payer: Self-pay

## 2019-12-30 DIAGNOSIS — M25562 Pain in left knee: Secondary | ICD-10-CM

## 2019-12-30 DIAGNOSIS — G8929 Other chronic pain: Secondary | ICD-10-CM

## 2019-12-30 MED ORDER — OXYCODONE-ACETAMINOPHEN 10-325 MG PO TABS
1.0000 | ORAL_TABLET | Freq: Three times a day (TID) | ORAL | 0 refills | Status: DC | PRN
Start: 2019-12-30 — End: 2020-03-28

## 2019-12-30 NOTE — Telephone Encounter (Signed)
Low dose oxycodone acetaminophen sent to her [facility]  She will need to come in to sign a pain contract and obtain a urine drug screen.    With her history I also sent a narcan Rx   She will need shortterm f/u video visit ok

## 2019-12-30 NOTE — Telephone Encounter (Signed)
Patient is calling to request a prior authorization for the oxycodone acetaminophen so that the medication $3.00 through medicaid. Please advise Cb- [phone]

## 2019-12-30 NOTE — Telephone Encounter (Signed)
Pharmacy will submit PA. Then information will be forwarded to see if approval is met.

## 2020-01-02 NOTE — Telephone Encounter (Signed)
Rx did not need PA per pharmacy, -resolved

## 2020-01-10 ENCOUNTER — Other Ambulatory Visit: Payer: Self-pay | Admitting: Critical Care Medicine

## 2020-01-10 DIAGNOSIS — R928 Other abnormal and inconclusive findings on diagnostic imaging of breast: Secondary | ICD-10-CM

## 2020-01-16 LAB — DRUG SCREEN 764883 11+OXYCO+ALC+CRT-BUND
Amphetamines, Urine: NEGATIVE ng/mL
BENZODIAZ UR QL: NEGATIVE ng/mL
Barbiturate: NEGATIVE ng/mL
Cocaine (Metabolite): NEGATIVE ng/mL
Creatinine: 296.5 mg/dL (ref 20.0–300.0)
Ethanol: NEGATIVE %
Meperidine: NEGATIVE ng/mL
Methadone Screen, Urine: NEGATIVE ng/mL
OPIATE SCREEN URINE: NEGATIVE ng/mL
Oxycodone/Oxymorphone, Urine: NEGATIVE ng/mL
Phencyclidine: NEGATIVE ng/mL
Propoxyphene: NEGATIVE ng/mL
Tramadol: NEGATIVE ng/mL
pH, Urine: 6 (ref 4.5–8.9)

## 2020-01-16 LAB — CANNABINOID CONFIRMATION, UR
CANNABINOIDS: POSITIVE — AB
Carboxy THC GC/MS Conf: 364 ng/mL

## 2020-01-24 ENCOUNTER — Other Ambulatory Visit: Payer: Self-pay

## 2020-01-24 ENCOUNTER — Encounter (HOSPITAL_COMMUNITY): Payer: Self-pay

## 2020-01-24 ENCOUNTER — Ambulatory Visit (HOSPITAL_COMMUNITY)
Admission: EM | Admit: 2020-01-24 | Discharge: 2020-01-24 | Disposition: A | Discharge status: Home / Self Care | Payer: Medicaid Other | Attending: Family Medicine | Admitting: Family Medicine

## 2020-01-24 DIAGNOSIS — R053 Chronic cough: Secondary | ICD-10-CM

## 2020-01-24 DIAGNOSIS — U071 COVID-19: Secondary | ICD-10-CM | POA: Insufficient documentation

## 2020-01-24 MED ORDER — BENZONATATE 100 MG PO CAPS: 100.0000 mg | ORAL_CAPSULE | Freq: Three times a day (TID) | ORAL | 0 refills | Status: DC | PRN

## 2020-01-24 MED ORDER — GUAIFENESIN 100 MG/5ML PO SOLN: 5.0000 mL | ORAL | 0 refills | Status: DC | PRN

## 2020-01-24 NOTE — Discharge Instructions (Signed)
Your chronic cough could be caused by a number of things including the blood pressure medication that you are taking.  I will give you some cough medication today that would last you until you are able to see your primary care doctor, Dr. Delford Field.  But this cough medication is not a long-term solution.  Dr. Delford Field should be the one that is managing your chronic conditions such as this cough.

## 2020-01-24 NOTE — ED Provider Notes (Signed)
[facility]    CSN: [id number] Arrival date & time: 01/24/20  1151      History   Chief Complaint Chief Complaint  Patient presents with  . URI  . Flank Pain    HPI Sydney Harris is a 55 y.o. female.   1 month of cough, congestion, body aches. She has not been tested for covid.  She has had the pfizer shots but not the booster.  Second dose was July.  Cough is productive of yellowish sputum.  Cough is worse at night and first thing in the monrnig  Non bloody sputum.  Body ache is right flank and under her anterior ribs.  She has been seen for the cough by her primary care doctor who prescribed cough medicine.  She is unsure if she had an x ray.  No pain with urination.  No increased frequency.  No hx of allergies.  Hasn't smoked in two years.  Prior to that she smoked for 5-10 years.  She smoked < 1 pack per day.  She denies night sweats but has feelings of being hot at night.  No unexplained weight loss.  Denies n/v/d.  Not currently taking any medications for the cough.       Past Medical History:  Diagnosis Date  . Anxiety   . Arthritis of left knee   . Bipolar disorder (HCC)   . Blood transfusion 1981   "related to my daughter being born"  . Depression   . Fibroid tumor   . Gout   . Hypertension   . Plantar fasciitis   . Substance abuse (HCC)    crack cocaine  14 yrs sober.    Patient Active Problem List   Diagnosis Date Noted  . Chronic bilateral low back pain without sciatica 12/28/2019  . Hyperlipidemia associated with type 2 diabetes mellitus (HCC) 12/22/2019  . Chronic cough 12/21/2019  . Chronic pain of left knee 12/21/2019  . Type 2 diabetes mellitus with hyperglycemia, without long-term current use of insulin (HCC) 12/21/2019    Past Surgical History:  Procedure Laterality Date  . ABDOMINAL HYSTERECTOMY     "for fibroid tumors"    OB History   No obstetric history on file.      Home Medications    Prior to Admission  medications   Medication Sig Start Date End Date Taking? Authorizing Provider  benzonatate (TESSALON PERLES) 100 MG capsule Take 1 capsule (100 mg total) by mouth 3 (three) times daily as needed for cough. 01/24/20 01/23/21 Yes ,  K, MD  guaiFENesin (ROBITUSSIN) 100 MG/5ML SOLN Take 5 mLs (100 mg total) by mouth every 4 (four) hours as needed for cough or to loosen phlegm. 01/24/20  Yes ,  K, MD  Accu-Chek Softclix Lancets lancets Use to measure blood sugar twice a day. E11.65 12/26/19   Wright, Patrick E, MD  ARIPiprazole (ABILIFY) 15 MG tablet Take 15 mg by mouth at bedtime.    [provider]  atorvastatin (LIPITOR) 10 MG tablet Take 1 tablet (10 mg total) by mouth daily. 12/22/19   Wright, Patrick E, MD  benztropine (COGENTIN) 0.5 MG tablet Take 0.5 mg by mouth at bedtime.     [provider]  Blood Glucose Monitoring Suppl (ACCU-CHEK GUIDE ME) w/Device KIT Use to measure blood sugar twice a day. E11.65 12/26/19   Wright, Patrick E, MD  escitalopram (LEXAPRO) 5 MG tablet Take 5 mg by mouth daily.    [provider]  glucose   blood (ACCU-CHEK GUIDE) test strip Use to measure blood sugar twice a day. E11.65 12/26/19   Elsie Stain, MD  losartan-hydrochlorothiazide (HYZAAR) 50-12.5 MG tablet Take 1 tablet by mouth daily. 12/21/19   Elsie Stain, MD  metFORMIN (GLUCOPHAGE) 500 MG tablet Take 1 tablet (500 mg total) by mouth daily with breakfast. 12/21/19   Elsie Stain, MD  prazosin (MINIPRESS) 1 MG capsule Take 1 mg by mouth at bedtime.    [provider]  promethazine-dextromethorphan (PROMETHAZINE-DM) 6.25-15 MG/5ML syrup Take 5 mLs by mouth 4 (four) times daily as needed for cough. 12/21/19   Elsie Stain, MD  traZODone (DESYREL) 100 MG tablet Take 100 mg by mouth at bedtime as needed. Take 1 & 1/2 tablets by mouth every night as needed    [provider]    Family History Family History  Problem Relation Age of Onset   . Diabetes Father   . Heart failure Neg Hx   . Cancer Neg Hx   . Colon polyps Neg Hx   . Colon cancer Neg Hx   . Esophageal cancer Neg Hx   . Rectal cancer Neg Hx   . Stomach cancer Neg Hx     Social History Social History   Tobacco Use  . Smoking status: Former Smoker    Packs/day: 0.12    Years: 33.00    Pack years: 3.96    Types: Cigarettes  . Smokeless tobacco: Never Used  Vaping Use  . Vaping Use: Never used  Substance Use Topics  . Alcohol use: No  . Drug use: Yes    Types: "Crack" cocaine, Marijuana    Comment: 06/30/2013 "last used crack ~ 03/2013"     Allergies   Penicillins   Review of Systems Review of Systems  All other systems reviewed and are negative.    Physical Exam Triage Vital Signs ED Triage Vitals  Enc Vitals Group     BP 01/24/20 1351 117/68     Pulse Rate 01/24/20 1351 79     Resp 01/24/20 1351 17     Temp 01/24/20 1351 98.2 F (36.8 C)     Temp Source 01/24/20 1351 Oral     SpO2 01/24/20 1351 100 %     Weight --      Height --      Head Circumference --      Peak Flow --      Pain Score 01/24/20 1354 6     Pain Loc --      Pain Edu? --      Excl. in [place]? --    No data found.  Updated Vital Signs BP 117/68 (BP Location: Right Arm)   Pulse 79   Temp 98.2 F (36.8 C) (Oral)   Resp 17   LMP 12/28/2010   SpO2 100%   Visual Acuity Right Eye Distance:   Left Eye Distance:   Bilateral Distance:    Right Eye Near:   Left Eye Near:    Bilateral Near:     Physical Exam Vitals reviewed.  Constitutional:      Appearance: Normal appearance.  HENT:     Head: Normocephalic.     Nose: Nose normal. No congestion.     Mouth/Throat:     Mouth: Mucous membranes are moist.     Pharynx: Oropharynx is clear. No oropharyngeal exudate.  Cardiovascular:     Rate and Rhythm: Normal rate and regular rhythm.     Pulses: Normal  pulses.  Pulmonary:     Effort: Pulmonary effort is normal. No respiratory distress.     Breath sounds:  Normal breath sounds. No stridor. No wheezing or rhonchi.  Neurological:     Mental Status: She is alert.  Psychiatric:     Comments: Patient is  argumentative.        UC Treatments / Results  Labs (all labs ordered are listed, but only abnormal results are displayed) Labs Reviewed  SARS CORONAVIRUS 2 (TAT 6-24 HRS)    EKG   Radiology No results found.  Procedures Procedures (including critical care time)  Medications Ordered in UC Medications - No data to display  Initial Impression / Assessment and Plan / UC Course  I have reviewed the triage vital signs and the nursing notes.  Pertinent labs & imaging results that were available during my care of the patient were reviewed by me and considered in my medical decision making (see chart for details).     Pt presents today with chronic cough for at least 2 months.  She has been seen in the urgent care for for this issue.  She is also been seen by her primary care doctor, Dr. Wright.  Discussed with patient that further work-up of this should be with her primary care doctor.  Appears that she has an appointment with him in 2 weeks and also with the pulmonologist on the 10th of this month.  Patient did not seem to be aware of the pulmonologist appointment.  Differential includes medication (was on lisinopril but now on losartan), laryngeal reflux, underlying pulmonary condition.  X-ray from November at the urgent care was negative for signs of infection.  Did not repeat today.  Will give patient guaifenesin and Tessalon Perles to help with symptoms until she is able to see the pulmonologist.  Final Clinical Impressions(s) / UC Diagnoses   Final diagnoses:  Chronic cough     Discharge Instructions     Your chronic cough could be caused by a number of things including the blood pressure medication that you are taking.  I will give you some cough medication today that would last you until you are able to see your primary care  doctor, Dr. Wright.  But this cough medication is not a long-term solution.  Dr. Wright should be the one that is managing your chronic conditions such as this cough.    ED Prescriptions    Medication Sig Dispense Auth. Provider   benzonatate (TESSALON PERLES) 100 MG capsule Take 1 capsule (100 mg total) by mouth 3 (three) times daily as needed for cough. 30 capsule ,  K, MD   guaiFENesin (ROBITUSSIN) 100 MG/5ML SOLN Take 5 mLs (100 mg total) by mouth every 4 (four) hours as needed for cough or to loosen phlegm. 236 mL ,  K, MD     PDMP not reviewed this encounter.   ,  K, MD 01/24/20 1524  

## 2020-01-24 NOTE — ED Triage Notes (Signed)
Pt presents with non productive cough, congestion, and left side flank pain X 1 month.

## 2020-01-25 LAB — SARS CORONAVIRUS 2 (TAT 6-24 HRS): SARS Coronavirus 2: POSITIVE — AB

## 2020-01-30 ENCOUNTER — Other Ambulatory Visit: Payer: Self-pay

## 2020-01-30 ENCOUNTER — Institutional Professional Consult (permissible substitution): Payer: Medicaid Other | Admitting: Internal Medicine

## 2020-01-30 NOTE — Progress Notes (Deleted)
Sydney Harris    [id number]    09-26-1965  Primary Care Physician:Wright, Burnett Harry, MD  Referring Physician: Boyce Medici, [address] [place],  [place] [address] Reason for Consultation: chronic cough Date of Consultation: 01/30/2020  Chief complaint:  No chief complaint on file.    HPI:  *** Social history:  Occupation: Exposures: Smoking history:  Social History   Occupational History  . Not on file  Tobacco Use  . Smoking status: Former Smoker    Packs/day: 0.12    Years: 33.00    Pack years: 3.96    Types: Cigarettes  . Smokeless tobacco: Never Used  Vaping Use  . Vaping Use: Never used  Substance and Sexual Activity  . Alcohol use: No  . Drug use: Yes    Types: "Crack" cocaine, Marijuana    Comment: 06/30/2013 "last used crack ~ 03/2013"  . Sexual activity: Never    Relevant family history: *** Family History  Problem Relation Age of Onset  . Diabetes Father   . Heart failure Neg Hx   . Cancer Neg Hx   . Colon polyps Neg Hx   . Colon cancer Neg Hx   . Esophageal cancer Neg Hx   . Rectal cancer Neg Hx   . Stomach cancer Neg Hx     Past Medical History:  Diagnosis Date  . Anxiety   . Arthritis of left knee   . Bipolar disorder ([place])   . Blood transfusion 1981   "related to my daughter being born"  . Depression   . Fibroid tumor   . Gout   . Hypertension   . Plantar fasciitis   . Substance abuse ([place])    crack cocaine  14 yrs sober.    Past Surgical History:  Procedure Laterality Date  . ABDOMINAL HYSTERECTOMY     "for fibroid tumors"     Physical Exam: Last menstrual period 12/28/2010. Gen:      No acute distress ENT:  ***no nasal polyps, mucus membranes moist Lungs:    No increased respiratory effort, symmetric chest wall excursion, clear to auscultation bilaterally, ***no wheezes or crackles CV:         Regular rate and rhythm; no murmurs, rubs, or gallops.  No pedal edema Abd:      + bowel sounds;  soft, non-tender; no distension MSK: no acute synovitis of DIP or PIP joints, no mechanics hands.  Skin:      Warm and dry; no rashes*** Neuro: normal speech, no focal facial asymmetry Psych: alert and oriented x3, normal mood and affect   Data Reviewed/Medical Decision Making:  Independent interpretation of tests: Imaging: . Review of patient's *** images revealed ***. The patient's images have been independently reviewed by me.    PFTs: I have personally reviewed the patient's PFTs and *** No flowsheet data found.  Labs: ***  Immunization status:  Immunization History  Administered Date(s) Administered  . Moderna Sars-Covid-2 Vaccination 06/04/2019, 07/02/2019    . I reviewed prior external note(s) from *** . I reviewed the result(s) of the labs and imaging as noted above.  . I have ordered ***  Discussion of management or test interpretation with another colleague ***.   Assessment:  ***. 1 or more chronic illnesses with severe exacerbation, progression, or side effects of treatment;  ***. 1 acute or chronic illness or injury that poses a threat to life or bodily function  Plan/Recommendations:   We  discussed disease management and progression at length today.   I spent *** minutes in the care of this patient today including pre-charting, chart review, review of results, face-to-face care, coordination of care and communication with consultants etc.).  [id number]  15-29 minutes or Straightforward MDM  [id number] 30-44 minutes or Low level MDM  59163 45-59 minutes or Moderate level MDM  [id number] 60-74 minutes or High level MDM   Return to Care: No follow-ups on file.  Lenice Llamas, MD [facility] and [place]  CC: Odem, Coolidge Breeze, FNP

## 2020-02-06 ENCOUNTER — Telehealth: Payer: Self-pay | Admitting: Critical Care Medicine

## 2020-02-06 NOTE — Telephone Encounter (Signed)
Called patient and LVM letting her know that her appointment for tomorrow morning has been cancelled due to the office being closed for inclement weather until 10am. Advised patient to call [phone] to schedule.

## 2020-02-07 ENCOUNTER — Ambulatory Visit: Payer: Medicaid Other | Admitting: Critical Care Medicine

## 2020-03-13 ENCOUNTER — Other Ambulatory Visit: Payer: Self-pay

## 2020-03-13 ENCOUNTER — Encounter: Payer: Self-pay | Admitting: Internal Medicine

## 2020-03-13 ENCOUNTER — Ambulatory Visit (INDEPENDENT_AMBULATORY_CARE_PROVIDER_SITE_OTHER): Payer: Medicaid Other | Admitting: Internal Medicine

## 2020-03-13 VITALS — BP 120/78 | HR 74 | Ht 63.0 in | Wt 215.4 lb

## 2020-03-13 DIAGNOSIS — R053 Chronic cough: Secondary | ICD-10-CM

## 2020-03-13 MED ORDER — OMEPRAZOLE 40 MG PO CPDR: 40.0000 mg | DELAYED_RELEASE_CAPSULE | Freq: Every day | ORAL | 3 refills | Status: DC

## 2020-03-13 NOTE — Patient Instructions (Signed)
The patient should have follow up scheduled with APP in 2 months.   Prior to next visit patient should have: Spirometry/Feno  What is GERD? Gastroesophageal reflux disease (GERD) is gastroesophageal reflux diseasewhich occurs when the lower esophageal sphincter (LES) opens spontaneously, for varying periods of time, or does not close properly and stomach contents rise up into the esophagus. GER is also called acid reflux or acid regurgitation, because digestive juices--called acids--rise up with the food. The esophagus is the tube that carries food from the mouth to the stomach. The LES is a ring of muscle at the bottom of the esophagus that acts like a valve between the esophagus and stomach.  When acid reflux occurs, food or fluid can be tasted in the back of the mouth. When refluxed stomach acid touches the lining of the esophagus it may cause a burning sensation in the chest or throat called heartburn or acid indigestion. Occasional reflux is common. Persistent reflux that occurs more than twice a week is considered GERD, and it can eventually lead to more serious health problems. People of all ages can have GERD. Studies have shown that GERD may worsen or contribute to asthma, chronic cough, and pulmonary fibrosis.   What are the symptoms of GERD? The main symptom of GERD in adults is frequent heartburn, also called acid indigestion--burning-type pain in the lower part of the mid-chest, behind the breast bone, and in the mid-abdomen.  Not all reflux is acidic in nature, and many patients don't have heart burn at all. Sometimes it feels like a cough (either dry or with mucus), choking sensation, asthma, shortness of breath, waking up at night, frequent throat clearing, or trouble swallowing.    What causes GERD? The reason some people develop GERD is still unclear. However, research shows that in people with GERD, the LES relaxes while the rest of the esophagus is working. Anatomical  abnormalities such as a hiatal hernia may also contribute to GERD. A hiatal hernia occurs when the upper part of the stomach and the LES move above the diaphragm, the muscle wall that separates the stomach from the chest. Normally, the diaphragm helps the LES keep acid from rising up into the esophagus. When a hiatal hernia is present, acid reflux can occur more easily. A hiatal hernia can occur in people of any age and is most often a normal finding in otherwise healthy people over age 55. Most of the time, a hiatal hernia produces no symptoms.   Other factors that may contribute to GERD include - Obesity or recent weight gain - Pregnancy  - Smoking  - Diet - Certain medications  Common foods that can worsen reflux symptoms include: - carbonated beverages - artificial sweeteners - citrus fruits  - chocolate  - drinks with caffeine or alcohol  - fatty and fried foods  - garlic and onions  - mint flavorings  - spicy foods  - tomato-based foods, like spaghetti sauce, salsa, chili, and pizza   Lifestyle Changes If you smoke, stop.  Avoid foods and beverages that worsen symptoms (see above.) Lose weight if needed.  Eat small, frequent meals.  Wear loose-fitting clothes.  Avoid lying down for 3 hours after a meal.  Raise the head of your bed 6 to 8 inches by securing wood blocks under the bedposts. Just using extra pillows will not help, but using a wedge-shaped pillow may be helpful.  Medications  H2 blockers, such as cimetidine (Tagamet HB), famotidine (Pepcid AC), nizatidine (Axid AR), and  ranitidine (Zantac 75), decrease acid production. They are available in prescription strength and over-the-counter strength. These drugs provide short-term relief and are effective for about half of those who have GERD symptoms.  Proton pump inhibitors include omeprazole (Prilosec, Zegerid), lansoprazole (Prevacid), pantoprazole (Protonix), rabeprazole (Aciphex), and esomeprazole (Nexium), which are  available by prescription. Prilosec is also available in over-the-counter strength. Proton pump inhibitors are more effective than H2 blockers and can relieve symptoms and heal the esophageal lining in almost everyone who has GERD.  Because drugs work in different ways, combinations of medications may help control symptoms. People who get heartburn after eating may take both antacids and H2 blockers. The antacids work first to neutralize the acid in the stomach, and then the H2 blockers act on acid production. By the time the antacid stops working, the H2 blocker will have stopped acid production. Your health care provider is the best source of information about how to use medications for GERD.   Points to Remember 1. You can have GERD without having heartburn. Your symptoms could include a dry cough, asthma symptoms, or trouble swallowing.  2. Taking medications daily as prescribed is important in controlling you symptoms.  Sometimes it can take up to 8 weeks to fully achieve the effects of the medications prescribed.  3. Coughing related to GERD can be difficult to treat and is very frustrating!  However, it is important to stick with these medications and lifestyle modifications before pursuing more aggressive or invasive test and treatments.

## 2020-03-13 NOTE — Progress Notes (Signed)
Sydney Harris    [id number]    1965-08-04  Primary Care Physician:Wright, Burnett Harry, MD  Referring Physician: Boyce Medici, [address] [place],  [place] [address] Reason for Consultation: cough Date of Consultation: 03/13/2020  Chief complaint:   Chief Complaint  Patient presents with  . Consult    Patient being referred by [facility] due to cough x2 months.  Pt states her cough is worse at night when she is going to lay down at night. Pt also has complaints of SOB and chest tightness when she coughs.     HPI: Sydney Harris is a 55 y.o. woman with history of tobacco use disorder who presents for new patient evaluation.   She is here for chronic cough of several months duration. She has white mucus that comes up. Cough is worse at night time. She notes frequent throat clearing. She was on lisinopril previously but this has been discontinued - she thinks the cough might be a little better. Denies reflux/heart burn.   Earlier this month she had covid. She had fever, cough, decreased appetite. Now she has been having persistent symptoms of cough. She denies shortness of breath and cough to me.   She was seen in urgent care an given hycodan cough syrup which helps her sleep. She is interested in a refill of this today to help her sleep.   She was prescribed inhalers but isn't clear on how or when to take them, and wasn't sure how they were supposed to make her feel.   She denies seasonal rhinitis or environmental allergies.   No prior history of pneumonia, bronchitis. No childhood respiratory conditions, no asthma.    Social history:   Social History   Occupational History  . Not on file  Tobacco Use  . Smoking status: Former Smoker    Packs/day: 0.50    Years: 33.00    Pack years: 16.50    Types: Cigarettes    Quit date: 2018    Years since quitting: 4.1  . Smokeless tobacco: Never Used  Vaping Use  . Vaping Use: Never used   Substance and Sexual Activity  . Alcohol use: No  . Drug use: Yes    Types: "Crack" cocaine, Marijuana    Comment: 06/30/2013 "last used crack ~ 03/2013"  . Sexual activity: Never    Relevant family history Family History  Problem Relation Age of Onset  . Diabetes Father   . Asthma Daughter   . Heart failure Neg Hx   . Cancer Neg Hx   . Colon polyps Neg Hx   . Colon cancer Neg Hx   . Esophageal cancer Neg Hx   . Rectal cancer Neg Hx   . Stomach cancer Neg Hx     Past Medical History:  Diagnosis Date  . Anxiety   . Arthritis of left knee   . Bipolar disorder ([place])   . Blood transfusion 1981   "related to my daughter being born"  . Depression   . Fibroid tumor   . Gout   . Hypertension   . Plantar fasciitis   . Substance abuse ([place])    crack cocaine  14 yrs sober.    Past Surgical History:  Procedure Laterality Date  . ABDOMINAL HYSTERECTOMY     "for fibroid tumors"     Physical Exam: Blood pressure 120/78, pulse 74, height 5\' 3"  (1.6 m), weight 215 lb 6.4 oz (97.7  kg), last menstrual period 12/28/2010, SpO2 100 %. Gen:      No acute distress ENT:  +cobblestoning in oropharynx, no nasal polyps, mucus membranes moist Lungs:    No increased respiratory effort, symmetric chest wall excursion, clear to auscultation bilaterally, no wheezes or crackles CV:         Regular rate and rhythm; no murmurs, rubs, or gallops.  No pedal edema Abd:      + bowel sounds; soft, non-tender; obese MSK: no acute synovitis of DIP or PIP joints, no mechanics hands.  Skin:      Warm and dry; no rashes Neuro: normal speech, no focal facial asymmetry Psych: alert and oriented x3, normal mood and affect   Data Reviewed/Medical Decision Making:  Independent interpretation of tests: Imaging: . Review of patient's chest xray Nov 08/2019 images revealed no acute cardiopulonary process. The patient's images have been independently reviewed by me.    PFTs: None on file  Labs:  Lab  Results  Component Value Date   WBC 6.6 12/21/2019   HGB 14.5 12/21/2019   HCT 43.2 12/21/2019   MCV 87 12/21/2019   PLT 349 12/21/2019   Lab Results  Component Value Date   NA 140 12/21/2019   K 4.2 12/21/2019   CL 102 12/21/2019   CO2 24 12/21/2019    Immunization status:  Immunization History  Administered Date(s) Administered  . Moderna Sars-Covid-2 Vaccination 06/04/2019, 07/02/2019    . I reviewed prior external note(s) from Dr. Joya Gaskins, ED . I reviewed the result(s) of the labs and imaging as noted above.  . I have ordered pfts  Assessment:  Chronic Cough History of tobacco use   Plan/Recommendations:   Differential diagnosis for cough is broad. She has already stopped lisinopril. Denies dyspnea although smoking related lung disease is on the differential. Highest suspicion for reflux given nocturnal symptoms, chronicity. Will trial once daily PPI for empiric GERD therapy. I did not prescribe hycodan syrup as this will only cover up the cough and not treat the underlying etiology.   Will obtain PFTs to evaluate for smoking related lung disease. She would like to hold off on inhaler therapies at this time.  We discussed disease management and progression at length today.    Return to Care: Return in about 2 months (around 05/11/2020).  Lenice Llamas, MD Pulmonary and [place]  CC: Odem, Coolidge Breeze, FNP

## 2020-03-16 ENCOUNTER — Other Ambulatory Visit: Payer: Self-pay

## 2020-03-16 ENCOUNTER — Ambulatory Visit (AMBULATORY_SURGERY_CENTER): Payer: Self-pay | Admitting: *Deleted

## 2020-03-16 VITALS — Ht 63.0 in | Wt 213.2 lb

## 2020-03-16 DIAGNOSIS — Z1211 Encounter for screening for malignant neoplasm of colon: Secondary | ICD-10-CM

## 2020-03-16 MED ORDER — SUPREP BOWEL PREP KIT 17.5-3.13-1.6 GM/177ML PO SOLN: 1.0000 | Freq: Once | ORAL | 0 refills | Status: AC

## 2020-03-16 NOTE — Progress Notes (Signed)
Pt made aware to call prior to procedure if diagnosed with covid and/or exposed to covid.  Understanding voiced.  Pt is aware that care partner will wait in the car during procedure; if they feel like they will be too hot or cold to wait in the car; they may wait in the 4 th floor lobby. Patient is aware to bring only one care partner. We want them to wear a mask (we do not have any that we can provide them), practice social distancing, and we will check their temperatures when they get here.  I did remind the patient that their care partner needs to stay in the parking lot the entire time and have a cell phone available, we will call them when the pt is ready for discharge. Patient will wear mask into building.   No trouble with anesthesia, denies being told they were difficult to intubate, or hx/fam hx of malignant hyperthermia per pt   No egg or soy allergy  No home oxygen use   No medications for weight loss taken  emmi information given  Pt denies constipation issues  Pt informed that we do not do prior authorizations for prep

## 2020-03-28 ENCOUNTER — Encounter: Payer: Self-pay | Admitting: Critical Care Medicine

## 2020-03-28 ENCOUNTER — Ambulatory Visit: Payer: Medicaid Other | Attending: Critical Care Medicine | Admitting: Critical Care Medicine

## 2020-03-28 ENCOUNTER — Other Ambulatory Visit: Payer: Self-pay

## 2020-03-28 VITALS — BP 123/86 | HR 66 | Resp 16 | Wt 217.0 lb

## 2020-03-28 DIAGNOSIS — I10 Essential (primary) hypertension: Secondary | ICD-10-CM | POA: Insufficient documentation

## 2020-03-28 DIAGNOSIS — E1165 Type 2 diabetes mellitus with hyperglycemia: Secondary | ICD-10-CM | POA: Insufficient documentation

## 2020-03-28 DIAGNOSIS — G8929 Other chronic pain: Secondary | ICD-10-CM | POA: Diagnosis not present

## 2020-03-28 DIAGNOSIS — M25662 Stiffness of left knee, not elsewhere classified: Secondary | ICD-10-CM | POA: Insufficient documentation

## 2020-03-28 DIAGNOSIS — M25562 Pain in left knee: Secondary | ICD-10-CM | POA: Diagnosis not present

## 2020-03-28 DIAGNOSIS — T464X5A Adverse effect of angiotensin-converting-enzyme inhibitors, initial encounter: Secondary | ICD-10-CM | POA: Diagnosis not present

## 2020-03-28 DIAGNOSIS — Z87891 Personal history of nicotine dependence: Secondary | ICD-10-CM | POA: Insufficient documentation

## 2020-03-28 DIAGNOSIS — Z79899 Other long term (current) drug therapy: Secondary | ICD-10-CM | POA: Diagnosis not present

## 2020-03-28 DIAGNOSIS — E1169 Type 2 diabetes mellitus with other specified complication: Secondary | ICD-10-CM | POA: Insufficient documentation

## 2020-03-28 DIAGNOSIS — Z880 Allergy status to penicillin: Secondary | ICD-10-CM | POA: Diagnosis not present

## 2020-03-28 DIAGNOSIS — M545 Low back pain, unspecified: Secondary | ICD-10-CM | POA: Diagnosis not present

## 2020-03-28 DIAGNOSIS — Z7984 Long term (current) use of oral hypoglycemic drugs: Secondary | ICD-10-CM | POA: Diagnosis not present

## 2020-03-28 DIAGNOSIS — R053 Chronic cough: Secondary | ICD-10-CM | POA: Diagnosis present

## 2020-03-28 DIAGNOSIS — E785 Hyperlipidemia, unspecified: Secondary | ICD-10-CM | POA: Insufficient documentation

## 2020-03-28 DIAGNOSIS — F319 Bipolar disorder, unspecified: Secondary | ICD-10-CM | POA: Insufficient documentation

## 2020-03-28 LAB — POCT GLYCOSYLATED HEMOGLOBIN (HGB A1C): HbA1c, POC (prediabetic range): 6 % (ref 5.7–6.4)

## 2020-03-28 LAB — POCT CBG (FASTING - GLUCOSE)-MANUAL ENTRY: Glucose Fasting, POC: 110 mg/dL — AB (ref 70–99)

## 2020-03-28 MED ORDER — OXYCODONE-ACETAMINOPHEN 10-325 MG PO TABS: 1.0000 | ORAL_TABLET | Freq: Three times a day (TID) | ORAL | 0 refills | Status: AC | PRN

## 2020-03-28 NOTE — Assessment & Plan Note (Signed)
At goal continue losartan HCT

## 2020-03-28 NOTE — Assessment & Plan Note (Signed)
I gave the patient a 7-day prescription for Percocet I checked the [place] no frequent prescribers  Patient is made a referral to [facility] pain clinic since she has Medicaid  Patient advised to follow-up with orthopedics and given all due consideration to a knee replacement

## 2020-03-28 NOTE — Patient Instructions (Addendum)
Short-term prescription for Percocet given every 8 hours as needed for severe pain only , count of 21 tablets given  Referral to pain management clinic made  You declined the pneumonia vaccine and tetanus vaccine this visit we will talk about it at another visit  No change in your other medications  Urine drug screen today will be obtained  You are doing an excellent job on your diabetes no changes made there your A1c was great at 6.0 today blood pressure was good as well  Refills on all your medications were sent to your [facility] your upcoming appointment for your colonoscopy  Return to see Dr. Joya Gaskins 4 months

## 2020-03-28 NOTE — Assessment & Plan Note (Signed)
Continue atorvastatin

## 2020-03-28 NOTE — Assessment & Plan Note (Signed)
Type 2 diabetes well controlled at this time continue Metformin

## 2020-03-28 NOTE — Assessment & Plan Note (Signed)
This is resolved was due to ACE inhibitor

## 2020-03-28 NOTE — Progress Notes (Signed)
Subjective:    Patient ID: Sydney Harris, female    DOB: 01/03/1966, 55 y.o.   MRN: [id number]  54 y.o.F here to est PCP    12/21/19 This patient was formally followed by another primary care provider in the region but has not been seen recently.  Patient did go to the urgent care recently with a chronic cough for 2 months given cough suppressants and prednisone with minimal improvement.  Note the patient is on an ACE inhibitor.  On arrival A1c is 6.5 blood glucose greater than 100.  Note prior diagnoses of bipolar disorder and schizophrenia and she is followed by [facility] and is on multiple mental health medications.  The patient has multiple primary care gaps  She has received her Covid vaccine the last one in mid June.  Patient needs a tetanus shot but wants to hold off on this she is already received her flu vaccine recently she is in need of hepatitis C screening colonoscopy and Pap smear  The patient has no other specific complaints except for severe left knee pain this been a concern for some time  03/28/2020 Patient returns in short-term follow-up on arrival blood glucose is 110 A1c is 6.0.  Patient's been maintaining Metformin and maintaining an adequate diet.  Patient's cough is now resolved since we switched her from Zestoretic to losartan HCT and blood pressure on arrival is good at 123/86  Patient complains of severe left knee pain is seen orthopedics she has bone-on-bone needs a knee replacement she is pondering this at this time.  Patient is requesting pain management for this.  She states tramadol Tylenol 3 have not helped Advil does not help.  She has previously been informed that we do not do chronic pain management in this clinic Note she has a pending colonoscopy and has been scheduled for later this month  Wt Readings from Last 3 Encounters: 03/28/20 : 217 lb (98.4 kg) 03/16/20 : 213 lb 3.2 oz (96.7 kg) 03/13/20 : 215 lb 6.4 oz (97.7 kg)  Cough This is a new  problem. The current episode started more than 1 month ago. The problem has been unchanged. The problem occurs constantly. The cough is non-productive. Associated symptoms include nasal congestion. Pertinent negatives include no chest pain, ear congestion, ear pain, fever, headaches, heartburn, hemoptysis, postnasal drip, rash, rhinorrhea, sore throat, shortness of breath or wheezing. She has tried prescription cough suppressant and oral steroids for the symptoms. The treatment provided significant relief. There is no history of asthma, bronchiectasis, bronchitis, emphysema, environmental allergies or pneumonia.   Past Medical History:  Diagnosis Date  . Anxiety   . Arthritis of left knee   . Bipolar disorder ([place])   . Blood transfusion 1981   "related to my daughter being born"  . Depression   . Diabetes mellitus without complication ([place])   . Fibroid tumor   . Gout   . Hypertension   . Plantar fasciitis   . Substance abuse ([place])    crack cocaine  14 yrs sober.     Family History  Problem Relation Age of Onset  . Diabetes Father   . Asthma Daughter   . Heart failure Neg Hx   . Cancer Neg Hx   . Colon polyps Neg Hx   . Colon cancer Neg Hx   . Esophageal cancer Neg Hx   . Rectal cancer Neg Hx   . Stomach cancer Neg Hx      Social History   Socioeconomic  History  . Marital status: Single    Spouse name: Not on file  . Number of children: Not on file  . Years of education: Not on file  . Highest education level: Not on file  Occupational History  . Not on file  Tobacco Use  . Smoking status: Former Smoker    Packs/day: 0.50    Years: 33.00    Pack years: 16.50    Types: Cigarettes    Quit date: 2018    Years since quitting: 4.1  . Smokeless tobacco: Never Used  Vaping Use  . Vaping Use: Never used  Substance and Sexual Activity  . Alcohol use: No  . Drug use: Yes    Types: "Crack" cocaine, Marijuana    Comment: 06/30/2013 "last used crack ~ 03/2013"  . Sexual  activity: Never  Other Topics Concern  . Not on file  Social History Narrative  . Not on file   Social Determinants of Health   Financial Resource Strain: Not on file  Food Insecurity: Not on file  Transportation Needs: Not on file  Physical Activity: Not on file  Stress: Not on file  Social Connections: Not on file  Intimate Partner Violence: Not on file     Allergies  Allergen Reactions  . Penicillins Hives    Has patient had a PCN reaction causing immediate rash, facial/tongue/throat swelling, SOB or lightheadedness with hypotension: Yes Has patient had a PCN reaction causing severe rash involving mucus membranes or skin necrosis: Yes Has patient had a PCN reaction that required hospitalization No Has patient had a PCN reaction occurring within the last 10 years: Yes If all of the above answers are "NO", then may proceed with Cephalosporin use.   . Tomato     hives     Outpatient Medications Prior to Visit  Medication Sig Dispense Refill  . ARIPiprazole (ABILIFY) 15 MG tablet Take 15 mg by mouth at bedtime.    Marland Kitchen atorvastatin (LIPITOR) 10 MG tablet Take 1 tablet (10 mg total) by mouth daily. 90 tablet 1  . benztropine (COGENTIN) 0.5 MG tablet Take 0.5 mg by mouth at bedtime.     . Blood Glucose Monitoring Suppl (ACCU-CHEK GUIDE ME) w/Device KIT Use to measure blood sugar twice a day. E11.65 1 kit 0  . escitalopram (LEXAPRO) 5 MG tablet Take 5 mg by mouth daily.    Marland Kitchen glucose blood (ACCU-CHEK GUIDE) test strip Use to measure blood sugar twice a day. E11.65 100 each 6  . guaiFENesin (ROBITUSSIN) 100 MG/5ML SOLN Take 5 mLs (100 mg total) by mouth every 4 (four) hours as needed for cough or to loosen phlegm. 236 mL 0  . losartan-hydrochlorothiazide (HYZAAR) 50-12.5 MG tablet Take 1 tablet by mouth daily. 90 tablet 3  . metFORMIN (GLUCOPHAGE) 500 MG tablet Take 1 tablet (500 mg total) by mouth daily with breakfast. 60 tablet 3  . Multiple Vitamin (MULTIVITAMIN ADULT PO) Take by  mouth daily.    . Multiple Vitamins-Minerals (HAIR SKIN AND NAILS FORMULA PO) Take by mouth daily.    Marland Kitchen omeprazole (PRILOSEC) 40 MG capsule Take 1 capsule (40 mg total) by mouth daily. 60 capsule 3  . prazosin (MINIPRESS) 1 MG capsule Take 1 mg by mouth at bedtime.    . traZODone (DESYREL) 100 MG tablet Take 100 mg by mouth at bedtime as needed. Take 1 & 1/2 tablets by mouth every night as needed    . Accu-Chek Softclix Lancets lancets Use to measure blood sugar  twice a day. E11.65 100 each 6  . promethazine-dextromethorphan (PROMETHAZINE-DM) 6.25-15 MG/5ML syrup Take 5 mLs by mouth 4 (four) times daily as needed for cough. (Patient not taking: Reported on 03/28/2020) 118 mL 0   No facility-administered medications prior to visit.     Review of Systems  Constitutional: Negative for fever.  HENT: Negative for ear pain, postnasal drip, rhinorrhea and sore throat.   Respiratory: Negative for cough, hemoptysis, shortness of breath and wheezing.   Cardiovascular: Negative for chest pain.  Gastrointestinal: Negative for heartburn.  Musculoskeletal: Positive for joint swelling.       Left knee pain  Skin: Negative for rash.  Allergic/Immunologic: Negative for environmental allergies.  Neurological: Negative for headaches.       Objective:   Physical Exam Vitals:   03/28/20 1051  BP: 123/86  Pulse: 66  Resp: 16  SpO2: 98%  Weight: 217 lb (98.4 kg)    Gen: Pleasant, well-nourished, in no distress,  normal affect  ENT: No lesions,  mouth clear,  oropharynx clear, no postnasal drip  Neck: No JVD, no TMG, no carotid bruits  Lungs: No use of accessory muscles, no dullness to percussion, clear without rales or rhonchi  Cardiovascular: RRR, heart sounds normal, no murmur or gallops, no peripheral edema  Abdomen: soft and NT, no HSM,  BS normal  Musculoskeletal: Extreme tenderness on the left knee both medial and lateral aspects limited range of motion some mild effusion seen  Neuro:  alert, non focal  Skin: Warm, no lesions or rashes  No results found.        Assessment & Plan:  I personally reviewed all images and lab data in the [facility] system as well as any outside material available during this office visit and agree with the  radiology impressions.   Chronic pain of left knee I gave the patient a 7-day prescription for Percocet I checked the [place] no frequent prescribers  Patient is made a referral to [facility] pain clinic since she has Medicaid  Patient advised to follow-up with orthopedics and given all due consideration to a knee replacement  Chronic cough This is resolved was due to ACE inhibitor  Type 2 diabetes mellitus with hyperglycemia, without long-term current use of insulin (HCC) Type 2 diabetes well controlled at this time continue Metformin  Hyperlipidemia associated with type 2 diabetes mellitus ([place]) Continue atorvastatin  HTN (hypertension) At goal continue losartan HCT   Juleen was seen today for diabetes.  Diagnoses and all orders for this visit:  Type 2 diabetes mellitus with hyperglycemia, without long-term current use of insulin (HCC) -     Glucose (CBG), Fasting -     HgB A1c  Chronic pain of left knee -     Ambulatory referral to Pain Clinic -     [id number] 11+Oxyco+Alc+Crt-Bund  Chronic bilateral low back pain without sciatica -     Ambulatory referral to Pain Clinic -     [id number] 11+Oxyco+Alc+Crt-Bund  Chronic cough  Hyperlipidemia associated with type 2 diabetes mellitus ([place])  Primary hypertension  Other orders -     oxyCODONE-acetaminophen (PERCOCET) 10-325 MG tablet; Take 1 tablet by mouth every 8 (eight) hours as needed for up to 5 days for pain.

## 2020-03-30 ENCOUNTER — Encounter: Payer: Medicaid Other | Admitting: Gastroenterology

## 2020-04-12 LAB — DRUG SCREEN 764883 11+OXYCO+ALC+CRT-BUND
Amphetamines, Urine: NEGATIVE ng/mL
BENZODIAZ UR QL: NEGATIVE ng/mL
Barbiturate: NEGATIVE ng/mL
Cocaine (Metabolite): NEGATIVE ng/mL
Creatinine: 179.2 mg/dL (ref 20.0–300.0)
Ethanol: NEGATIVE %
Meperidine: NEGATIVE ng/mL
Methadone Screen, Urine: NEGATIVE ng/mL
OPIATE SCREEN URINE: NEGATIVE ng/mL
Oxycodone/Oxymorphone, Urine: NEGATIVE ng/mL
Phencyclidine: NEGATIVE ng/mL
Propoxyphene: NEGATIVE ng/mL
Tramadol: NEGATIVE ng/mL
pH, Urine: 5.6 (ref 4.5–8.9)

## 2020-04-12 LAB — CANNABINOID CONFIRMATION, UR
CANNABINOIDS: POSITIVE — AB
Carboxy THC GC/MS Conf: 126 ng/mL

## 2020-04-16 ENCOUNTER — Encounter: Payer: Self-pay | Admitting: Gastroenterology

## 2020-04-16 ENCOUNTER — Other Ambulatory Visit: Payer: Self-pay

## 2020-04-16 ENCOUNTER — Ambulatory Visit (AMBULATORY_SURGERY_CENTER): Payer: Medicaid Other | Admitting: Gastroenterology

## 2020-04-16 VITALS — BP 128/64 | HR 64 | Temp 97.1°F | Resp 14 | Ht 63.0 in | Wt 213.0 lb

## 2020-04-16 DIAGNOSIS — Z1211 Encounter for screening for malignant neoplasm of colon: Secondary | ICD-10-CM

## 2020-04-16 DIAGNOSIS — K635 Polyp of colon: Secondary | ICD-10-CM

## 2020-04-16 DIAGNOSIS — D123 Benign neoplasm of transverse colon: Secondary | ICD-10-CM

## 2020-04-16 MED ORDER — SODIUM CHLORIDE 0.9 % IV SOLN: 500.0000 mL | Freq: Once | INTRAVENOUS | Status: DC

## 2020-04-16 NOTE — Progress Notes (Signed)
VS-CW  Pt's states no medical or surgical changes since previsit or office visit.  

## 2020-04-16 NOTE — Progress Notes (Signed)
To PACU, VSS. Report to [email or web address] 

## 2020-04-16 NOTE — Patient Instructions (Signed)
Handout given for polyps.  YOU HAD AN ENDOSCOPIC PROCEDURE TODAY AT THE [facility]:   Refer to the procedure report that was given to you for any specific questions about what was found during the examination.  If the procedure report does not answer your questions, please call your gastroenterologist to clarify.  If you requested that your care partner not be given the details of your procedure findings, then the procedure report has been included in a sealed envelope for you to review at your convenience later.  YOU SHOULD EXPECT: Some feelings of bloating in the abdomen. Passage of more gas than usual.  Walking can help get rid of the air that was put into your GI tract during the procedure and reduce the bloating. If you had a lower endoscopy (such as a colonoscopy or flexible sigmoidoscopy) you may notice spotting of blood in your stool or on the toilet paper. If you underwent a bowel prep for your procedure, you may not have a normal bowel movement for a few days.  Please Note:  You might notice some irritation and congestion in your nose or some drainage.  This is from the oxygen used during your procedure.  There is no need for concern and it should clear up in a day or so.  SYMPTOMS TO REPORT IMMEDIATELY:   Following lower endoscopy (colonoscopy or flexible sigmoidoscopy):  Excessive amounts of blood in the stool  Significant tenderness or worsening of abdominal pains  Swelling of the abdomen that is new, acute  Fever of 100F or higher  For urgent or emergent issues, a gastroenterologist can be reached at any hour by calling [phone]. Do not use MyChart messaging for urgent concerns.    DIET:  We do recommend a small meal at first, but then you may proceed to your regular diet.  Drink plenty of fluids but you should avoid alcoholic beverages for 24 hours.  ACTIVITY:  You should plan to take it easy for the rest of today and you should NOT DRIVE or use heavy  machinery until tomorrow (because of the sedation medicines used during the test).    FOLLOW UP: Our staff will call the number listed on your records 48-72 hours following your procedure to check on you and address any questions or concerns that you may have regarding the information given to you following your procedure. If we do not reach you, we will leave a message.  We will attempt to reach you two times.  During this call, we will ask if you have developed any symptoms of COVID 19. If you develop any symptoms (ie: fever, flu-like symptoms, shortness of breath, cough etc.) before then, please call [phone].  If you test positive for Covid 19 in the 2 weeks post procedure, please call and report this information to us.    If any biopsies were taken you will be contacted by phone or by letter within the next 1-3 weeks.  Please call us at [phone] if you have not heard about the biopsies in 3 weeks.    SIGNATURES/CONFIDENTIALITY: You and/or your care partner have signed paperwork which will be entered into your electronic medical record.  These signatures attest to the fact that that the information above on your After Visit Summary has been reviewed and is understood.  Full responsibility of the confidentiality of this discharge information lies with you and/or your care-partner. 

## 2020-04-16 NOTE — Progress Notes (Signed)
Called to room to assist during endoscopic procedure.  Patient ID and intended procedure confirmed with present staff. Received instructions for my participation in the procedure from the performing physician.  

## 2020-04-16 NOTE — Progress Notes (Signed)
1651-during instructions pt begins sleeping and is unarousable, VS remain stable, pt breathing within normal, respirations even and unlabored, pt boyfriend at bedside, attempt to arouse pt for 5 mins then pt started opening eyes, starts to awaken, cries and hugs boyfriend, becomes more alert and oriented x3, Pt awake and asks for juice, apple juice given, drinks a few sips without problem, up to bathroom, gait steady, no dizziness, instructions to pt and boyfriend to call if she has any other problems with consciousness, as well as other instructions, verb understanding.

## 2020-04-16 NOTE — Op Note (Signed)
[facility] Patient Name: Sydney Harris Procedure Date: 04/16/2020 3:57 PM MRN: [id number] Endoscopist: Ponder[place]. Loletha Carrow , MD Age: 55 Referring MD:  Date of Birth: 1965/09/30 Gender: Female Account #: [id number] Procedure:                Colonoscopy Indications:              Screening for colorectal malignant neoplasm, This                            is the patient's first colonoscopy Medicines:                Monitored Anesthesia Care Procedure:                Pre-Anesthesia Assessment:                           - Prior to the procedure, a History and Physical                            was performed, and patient medications and                            allergies were reviewed. The patient's tolerance of                            previous anesthesia was also reviewed. The risks                            and benefits of the procedure and the sedation                            options and risks were discussed with the patient.                            All questions were answered, and informed consent                            was obtained. Prior Anticoagulants: The patient has                            taken no previous anticoagulant or antiplatelet                            agents. ASA Grade Assessment: II - A patient with                            mild systemic disease. After reviewing the risks                            and benefits, the patient was deemed in                            satisfactory condition to undergo the procedure.  After obtaining informed consent, the colonoscope                            was passed under direct vision. Throughout the                            procedure, the patient's blood pressure, pulse, and                            oxygen saturations were monitored continuously. The                            Olympus CF-HQ190L ([id number]) Colonoscope was                            introduced through the anus  and advanced to the the                            terminal ileum, with identification of the                            appendiceal orifice and IC valve. The colonoscopy                            was performed without difficulty. The patient                            tolerated the procedure well. The quality of the                            bowel preparation was excellent. The terminal                            ileum, ileocecal valve, appendiceal orifice, and                            rectum were photographed. The bowel preparation                            used was SUPREP. Scope In: 4:13:01 PM Scope Out: 4:27:19 PM Scope Withdrawal Time: 0 hours 10 minutes 41 seconds  Total Procedure Duration: 0 hours 14 minutes 18 seconds  Findings:                 The perianal and digital rectal examinations were                            normal.                           A diminutive polyp was found in the distal                            transverse colon. The polyp was sessile. The polyp  was removed with a cold snare. Resection and                            retrieval were complete.                           The exam was otherwise without abnormality on                            direct and retroflexion views. Complications:            No immediate complications. Estimated Blood Loss:     Estimated blood loss was minimal. Impression:               - One diminutive polyp in the distal transverse                            colon, removed with a cold snare. Resected and                            retrieved.                           - The examination was otherwise normal on direct                            and retroflexion views. Recommendation:           - Patient has a contact number available for                            emergencies. The signs and symptoms of potential                            delayed complications were discussed with the                             patient. Return to normal activities tomorrow.                            Written discharge instructions were provided to the                            patient.                           - Resume previous diet.                           - Continue present medications.                           - Await pathology results.                           - Repeat colonoscopy is recommended for  surveillance. The colonoscopy date will be                            determined after pathology results from today's                            exam become available for review. Kileen Lange L. Loletha Carrow, MD 04/16/2020 4:31:16 PM This report has been signed electronically.

## 2020-04-26 ENCOUNTER — Encounter: Payer: Self-pay | Admitting: Gastroenterology

## 2020-04-27 NOTE — Addendum Note (Signed)
Addended by: Amado Coe on: 04/27/2020 04:55 PM   Modules accepted: Orders

## 2020-05-11 ENCOUNTER — Ambulatory Visit: Payer: Medicaid Other | Admitting: Primary Care

## 2020-07-29 NOTE — Progress Notes (Signed)
Subjective:    Patient ID: Sydney Harris, female    DOB: November 04, 1965, 55 y.o.   MRN: [id number]  54 y.o.F here to est PCP    12/21/19 This patient was formally followed by another primary care provider in the region but has not been seen recently.  Patient did go to the urgent care recently with a chronic cough for 2 months given cough suppressants and prednisone with minimal improvement.  Note the patient is on an ACE inhibitor.  On arrival A1c is 6.5 blood glucose greater than 100.  Note prior diagnoses of bipolar disorder and schizophrenia and she is followed by Beverly Sessions and is on multiple mental health medications.  The patient has multiple primary care gaps  She has received her Covid vaccine the last one in mid June.  Patient needs a tetanus shot but wants to hold off on this she is already received her flu vaccine recently she is in need of hepatitis C screening colonoscopy and Pap smear  The patient has no other specific complaints except for severe left knee pain this been a concern for some time  03/28/2020 Patient returns in short-term follow-up on arrival blood glucose is 110 A1c is 6.0.  Patient's been maintaining Metformin and maintaining an adequate diet.  Patient's cough is now resolved since we switched her from Zestoretic to losartan HCT and blood pressure on arrival is good at 123/86  Patient complains of severe left knee pain is seen orthopedics she has bone-on-bone needs a knee replacement she is pondering this at this time.  Patient is requesting pain management for this.  She states tramadol Tylenol 3 have not helped Advil does not help.  She has previously been informed that we do not do chronic pain management in this clinic Note she has a pending colonoscopy and has been scheduled for later this month  Wt Readings from Last 3 Encounters: 03/28/20 : 217 lb (98.4 kg) 03/16/20 : 213 lb 3.2 oz (96.7 kg) 03/13/20 : 215 lb 6.4 oz (97.7 kg)  7/11 Patient seen  return follow-up notes her weight is down 7 pounds.  She is trying to follow a healthy diet but is still eating a lot of carbohydrates processed cereals sausage.  She is in a pain clinic now receiving Percocets for chronic pain of the left knee.  She has arthritis of the left knee and will need follow-up with orthopedic surgery for potential knee replacement in the future when she makes this decision.  Breathing is stable at this time.  She has no other complaints.  Past Medical History:  Diagnosis Date   Anxiety    Arthritis of left knee    Bipolar disorder ([place])    Blood transfusion 1981   "related to my daughter being born"   Depression    Diabetes mellitus without complication ([place])    Fibroid tumor    Gout    Hypertension    Plantar fasciitis    Substance abuse ([place])    crack cocaine  14 yrs sober.     Family History  Problem Relation Age of Onset   Diabetes Father    Asthma Daughter    Heart failure Neg Hx    Cancer Neg Hx    Colon polyps Neg Hx    Colon cancer Neg Hx    Esophageal cancer Neg Hx    Rectal cancer Neg Hx    Stomach cancer Neg Hx      Social History   Socioeconomic  History   Marital status: Single    Spouse name: Not on file   Number of children: Not on file   Years of education: Not on file   Highest education level: Not on file  Occupational History   Not on file  Tobacco Use   Smoking status: Former    Packs/day: 0.50    Years: 33.00    Pack years: 16.50    Types: Cigarettes    Quit date: 2018    Years since quitting: 4.5   Smokeless tobacco: Never  Vaping Use   Vaping Use: Never used  Substance and Sexual Activity   Alcohol use: No   Drug use: Yes    Types: "Crack" cocaine, Marijuana    Comment: 06/30/2013 "last used crack ~ 03/2013"   Sexual activity: Never  Other Topics Concern   Not on file  Social History Narrative   Not on file   Social Determinants of Health   Financial Resource Strain: Not on file  Food Insecurity: Not  on file  Transportation Needs: Not on file  Physical Activity: Not on file  Stress: Not on file  Social Connections: Not on file  Intimate Partner Violence: Not on file     Allergies  Allergen Reactions   Penicillins Hives    Has patient had a PCN reaction causing immediate rash, facial/tongue/throat swelling, SOB or lightheadedness with hypotension: Yes Has patient had a PCN reaction causing severe rash involving mucus membranes or skin necrosis: Yes Has patient had a PCN reaction that required hospitalization No Has patient had a PCN reaction occurring within the last 10 years: Yes If all of the above answers are "NO", then may proceed with Cephalosporin use.    Tomato     hives     Outpatient Medications Prior to Visit  Medication Sig Dispense Refill   Accu-Chek Softclix Lancets lancets Use to measure blood sugar twice a day. E11.65 100 each 6   ARIPiprazole (ABILIFY) 15 MG tablet Take 15 mg by mouth at bedtime.     benztropine (COGENTIN) 0.5 MG tablet Take 0.5 mg by mouth at bedtime.      Blood Glucose Monitoring Suppl (ACCU-CHEK GUIDE ME) w/Device KIT Use to measure blood sugar twice a day. E11.65 1 kit 0   escitalopram (LEXAPRO) 5 MG tablet Take 5 mg by mouth daily.     glucose blood (ACCU-CHEK GUIDE) test strip Use to measure blood sugar twice a day. E11.65 100 each 6   guaiFENesin (ROBITUSSIN) 100 MG/5ML SOLN Take 5 mLs (100 mg total) by mouth every 4 (four) hours as needed for cough or to loosen phlegm. 236 mL 0   Multiple Vitamin (MULTIVITAMIN ADULT PO) Take by mouth daily.     Multiple Vitamins-Minerals (HAIR SKIN AND NAILS FORMULA PO) Take by mouth daily.     prazosin (MINIPRESS) 1 MG capsule Take 1 mg by mouth at bedtime.     traZODone (DESYREL) 100 MG tablet Take 100 mg by mouth at bedtime as needed. Take 1 & 1/2 tablets by mouth every night as needed     atorvastatin (LIPITOR) 10 MG tablet Take 1 tablet (10 mg total) by mouth daily. 90 tablet 1    losartan-hydrochlorothiazide (HYZAAR) 50-12.5 MG tablet Take 1 tablet by mouth daily. 90 tablet 3   metFORMIN (GLUCOPHAGE) 500 MG tablet Take 1 tablet (500 mg total) by mouth daily with breakfast. 60 tablet 3   omeprazole (PRILOSEC) 40 MG capsule Take 1 capsule (40 mg total) by  mouth daily. 60 capsule 3   promethazine-dextromethorphan (PROMETHAZINE-DM) 6.25-15 MG/5ML syrup Take 5 mLs by mouth 4 (four) times daily as needed for cough. 118 mL 0   No facility-administered medications prior to visit.     Review of Systems  Musculoskeletal:  Positive for joint swelling.       Left knee pain      Objective:   Physical Exam Vitals:   07/30/20 1038  BP: 114/74  Pulse: 65  SpO2: 100%  Weight: 210 lb (95.3 kg)  Height: _0  (1.6 m)    Gen: Pleasant, well-nourished, in no distress,  normal affect  ENT: No lesions,  mouth clear,  oropharynx clear, no postnasal drip  Neck: No JVD, no TMG, no carotid bruits  Lungs: No use of accessory muscles, no dullness to percussion, clear without rales or rhonchi  Cardiovascular: RRR, heart sounds normal, no murmur or gallops, no peripheral edema  Abdomen: soft and NT, no HSM,  BS normal  Musculoskeletal: Extreme tenderness on the left knee both medial and lateral aspects limited range of motion some mild effusion seen  Neuro: alert, non focal  Skin: Warm, no lesions or rashes  No results found.        Assessment & Plan:  I personally reviewed all images and lab data in the [facility] system as well as any outside material available during this office visit and agree with the  radiology impressions.   Chronic pain of left knee Refer to physical therapy continue pain management  Hyperlipidemia associated with type 2 diabetes mellitus ([place]) Continue lipid therapy  Type 2 diabetes mellitus with hyperglycemia, without long-term current use of insulin ([facility]) Continue current therapy with metformin Hemoglobin A1c at goal at 6.4  HTN  (hypertension) Continue losartan HCT blood pressure at goal   Sydney Harris was seen today for diabetes.  Diagnoses and all orders for this visit:  Type 2 diabetes mellitus with hyperglycemia, without long-term current use of insulin (H[facility]) -     POCT glycosylated hemoglobin (Hb A1C)  Chronic pain of left knee -     Ambulatory referral to Physical Therapy  Primary osteoarthritis of left knee -     Ambulatory referral to Physical Therapy  Hyperlipidemia associated with type 2 diabetes mellitus ([place])  Primary hypertension  Other orders -     atorvastatin (LIPITOR) 10 MG tablet; Take 1 tablet (10 mg total) by mouth daily. -     losartan-hydrochlorothiazide (HYZAAR) 50-12.5 MG tablet; Take 1 tablet by mouth daily. -     metFORMIN (GLUCOPHAGE) 500 MG tablet; Take 1 tablet (500 mg total) by mouth daily with breakfast. -     omeprazole (PRILOSEC) 40 MG capsule; Take 1 capsule (40 mg total) by mouth daily.

## 2020-07-30 ENCOUNTER — Encounter: Payer: Self-pay | Admitting: Critical Care Medicine

## 2020-07-30 ENCOUNTER — Ambulatory Visit: Payer: Medicaid Other | Attending: Critical Care Medicine | Admitting: Critical Care Medicine

## 2020-07-30 ENCOUNTER — Other Ambulatory Visit: Payer: Self-pay

## 2020-07-30 VITALS — BP 114/74 | HR 65 | Ht 63.0 in | Wt 210.0 lb

## 2020-07-30 DIAGNOSIS — M109 Gout, unspecified: Secondary | ICD-10-CM | POA: Insufficient documentation

## 2020-07-30 DIAGNOSIS — Z833 Family history of diabetes mellitus: Secondary | ICD-10-CM | POA: Diagnosis not present

## 2020-07-30 DIAGNOSIS — E1165 Type 2 diabetes mellitus with hyperglycemia: Secondary | ICD-10-CM | POA: Diagnosis present

## 2020-07-30 DIAGNOSIS — Z87891 Personal history of nicotine dependence: Secondary | ICD-10-CM | POA: Diagnosis not present

## 2020-07-30 DIAGNOSIS — M25562 Pain in left knee: Secondary | ICD-10-CM | POA: Diagnosis not present

## 2020-07-30 DIAGNOSIS — E1169 Type 2 diabetes mellitus with other specified complication: Secondary | ICD-10-CM | POA: Diagnosis not present

## 2020-07-30 DIAGNOSIS — M1712 Unilateral primary osteoarthritis, left knee: Secondary | ICD-10-CM | POA: Diagnosis not present

## 2020-07-30 DIAGNOSIS — I10 Essential (primary) hypertension: Secondary | ICD-10-CM | POA: Diagnosis not present

## 2020-07-30 DIAGNOSIS — E785 Hyperlipidemia, unspecified: Secondary | ICD-10-CM

## 2020-07-30 DIAGNOSIS — Z7984 Long term (current) use of oral hypoglycemic drugs: Secondary | ICD-10-CM | POA: Diagnosis not present

## 2020-07-30 DIAGNOSIS — Z79899 Other long term (current) drug therapy: Secondary | ICD-10-CM | POA: Diagnosis not present

## 2020-07-30 DIAGNOSIS — G8929 Other chronic pain: Secondary | ICD-10-CM | POA: Insufficient documentation

## 2020-07-30 LAB — POCT GLYCOSYLATED HEMOGLOBIN (HGB A1C): HbA1c, POC (controlled diabetic range): 6.4 % (ref 0.0–7.0)

## 2020-07-30 MED ORDER — ATORVASTATIN CALCIUM 10 MG PO TABS: 10.0000 mg | ORAL_TABLET | Freq: Every day | ORAL | 1 refills | Status: DC

## 2020-07-30 MED ORDER — OMEPRAZOLE 40 MG PO CPDR: 40.0000 mg | DELAYED_RELEASE_CAPSULE | Freq: Every day | ORAL | 3 refills | Status: DC

## 2020-07-30 MED ORDER — METFORMIN HCL 500 MG PO TABS: 500.0000 mg | ORAL_TABLET | Freq: Every day | ORAL | 3 refills | Status: DC

## 2020-07-30 MED ORDER — LOSARTAN POTASSIUM-HCTZ 50-12.5 MG PO TABS: 1.0000 | ORAL_TABLET | Freq: Every day | ORAL | 3 refills | Status: DC

## 2020-07-30 NOTE — Assessment & Plan Note (Signed)
Continue losartan HCT blood pressure at goal

## 2020-07-30 NOTE — Assessment & Plan Note (Signed)
Continue lipid therapy

## 2020-07-30 NOTE — Assessment & Plan Note (Signed)
Refer to physical therapy continue pain management

## 2020-07-30 NOTE — Patient Instructions (Signed)
A referral to physical therapy will be made  We discussed healthy diet choices to help with weight loss  Physical therapy consult will be made for your left knee  Continue with the pain management clinic as you are doing  Refills on your medications sent to your [facility]  Please obtain an eye exam use a resource sheet we provided this is to screen you for retinal disease from diabetes should do this once a year  Return to see Dr. Joya Gaskins 3 months

## 2020-07-30 NOTE — Assessment & Plan Note (Addendum)
Continue current therapy with metformin Hemoglobin A1c at goal at 6.4

## 2020-08-10 ENCOUNTER — Ambulatory Visit: Payer: Medicaid Other

## 2020-08-17 ENCOUNTER — Ambulatory Visit: Payer: Medicare Other | Attending: Critical Care Medicine | Admitting: Physical Therapy

## 2020-08-17 ENCOUNTER — Encounter: Payer: Self-pay | Admitting: Physical Therapy

## 2020-08-17 ENCOUNTER — Other Ambulatory Visit: Payer: Self-pay

## 2020-08-17 DIAGNOSIS — M25562 Pain in left knee: Secondary | ICD-10-CM | POA: Diagnosis present

## 2020-08-17 DIAGNOSIS — R2689 Other abnormalities of gait and mobility: Secondary | ICD-10-CM | POA: Insufficient documentation

## 2020-08-17 DIAGNOSIS — M6281 Muscle weakness (generalized): Secondary | ICD-10-CM | POA: Insufficient documentation

## 2020-08-17 DIAGNOSIS — G8929 Other chronic pain: Secondary | ICD-10-CM | POA: Insufficient documentation

## 2020-08-17 NOTE — Patient Instructions (Signed)
Access Code: [email or web address] URL: [email or web address] Date: 08/17/2020 Prepared by: Shearon Balo  Exercises Supine Quad Set - 3 x daily - 7 x weekly - 2 sets - 10 reps - 3'' hold

## 2020-08-17 NOTE — Therapy (Deleted)
[place], [place], [address] Phone: [phone]   Fax:  [phone]  Physical Therapy Evaluation  Patient Details  Name: Sydney Harris MRN: [id number] Date of Birth: [birth year] Referring Provider (PT): Sydney Stain, MD  Encounter Date: 08/17/2020   PT End of Session - 08/17/20 1007     Visit Number 1    Number of Visits 8    Date for PT Re-Evaluation 10/12/20    Authorization Type MCR    Progress Note Due on Visit 10    PT Start Time 0915    PT Stop Time 0955    PT Time Calculation (min) 40 min    Activity Tolerance Patient tolerated treatment well    Behavior During Therapy [facility] for tasks assessed/performed             Past Medical History:  Diagnosis Date   Anxiety    Arthritis of left knee    Bipolar disorder ([place])    Blood transfusion 1981   "related to my daughter being born"   Depression    Diabetes mellitus without complication ([place])    Fibroid tumor    Gout    Hypertension    Plantar fasciitis    Substance abuse ([place])    crack cocaine  14 yrs sober.    Past Surgical History:  Procedure Laterality Date   ABDOMINAL HYSTERECTOMY     "for fibroid tumors"              There were no vitals filed for this visit.                       Objective measurements completed on examination: See above findings.               PT Education - 08/17/20 1014     Education Details POC, diagnosis, prognosis, HEP.  Pt educated via explanation, demonstration, and handout (HEP).  Pt confirms understanding verbally.              PT Short Term Goals - 08/17/20 1011       PT SHORT TERM GOAL #1   Title Sydney Harris will be >75% HEP compliant within 3 weeks to improve carryover between sessions and facilitate independent management of condition.    Target Date 09/07/20               PT Long Term Goals - 08/17/20 1011        PT LONG TERM GOAL #1   Title Sydney Harris will improve FOTO score from 54 (on evaluation) to 62 as a proxy for functional improvement    Target Date 10/12/20      PT LONG TERM GOAL #2   Title Sydney Harris will improve knee flexion in functional squat to 55 degrees to show improvement in ability to lift and/or pick up items from the ground  EVAL: 45 degrees    Target Date 10/12/20      PT LONG TERM GOAL #3   Title Sydney Harris will improve 30'' STS (MCID 2) to >/= 9x to show improved LE strength and improved transfers  EVAL: 6x    Target Date 10/12/20      PT LONG TERM GOAL #4   Title Sydney Harris will achieve 90 degrees knee flexion by D/C (see POC end date) to improve ability to safely navigate steps in the community  EVAL: 75  degrees    Target Date 10/12/20                    Plan - 08/17/20 1009     Clinical Impression Statement Sydney Harris is a 55 y.o. female who presents to clinic with signs and sxs consistent with L knee pain d/t significant degeneration.  Pt presents with pain and impairments/deficits in: knee ROM, knee strength, gait.  Exam limited d/t high level of pain.  Activity limitations include: squatting, stairs, ambulation.  Participation limitations include: community navigation, housework, work.  Pt will benefit from skilled therapy to address pain and the listed deficits in order to achieve functional goals, enable safety and independence in completion of daily tasks, and return to PLOF.    Personal Factors and Comorbidities Comorbidity 2    Comorbidities See "Pertinent History" in "Subjective Assessment" and/or precuations    Stability/Clinical Decision Making Stable/Uncomplicated    Clinical Decision Making Low    Rehab Potential Fair    PT Frequency 1x / week    PT Duration 8 weeks    PT Treatment/Interventions --   ADLs/Self Care Home Management;Aquatic Therapy;Therapeutic activities;Therapeutic  exercise;Neuromuscular re-education;Manual techniques;Iontophoresis '4mg'$ /ml Dexamethasone;Dry needling;Gait training;   PT Next Visit Plan gradual LE strengthening    PT Home Exercise Plan [id number]    Consulted and Agree with Plan of Care Patient             Patient will benefit from skilled therapeutic intervention in order to improve the following deficits and impairments:     Visit Diagnosis: No diagnosis found.     Problem List Patient Active Problem List   Diagnosis Date Noted   HTN (hypertension) 03/28/2020   Chronic bilateral low back pain without sciatica 12/28/2019   Hyperlipidemia associated with type 2 diabetes mellitus ([place]) 12/22/2019   Chronic pain of left knee 12/21/2019   Type 2 diabetes mellitus with hyperglycemia, without long-term current use of insulin ([place]) 12/21/2019    Sydney Harris 08/17/2020, 10:16 AM  [facility] [address] [place], [place], [address] Phone: [phone]   Fax:  [phone]  Name: Sydney Harris MRN: [id number] Date of Birth: Feb 24, 1965

## 2020-08-17 NOTE — Therapy (Signed)
[place], [place], [address] Phone: [phone]   Fax:  [phone]  Physical Therapy Evaluation  Patient Details  Name: Sydney Harris MRN: [id number] Date of Birth: 04/22/1965 Referring Provider (PT): Elsie Stain, MD   Encounter Date: 08/17/2020   PT End of Session - 08/17/20 1007     Visit Number 1    Number of Visits 8    Date for PT Re-Evaluation 10/12/20    Authorization Type MCR    Progress Note Due on Visit 10    PT Start Time 0915    PT Stop Time 0955    PT Time Calculation (min) 40 min    Activity Tolerance Patient tolerated treatment well    Behavior During Therapy [facility] for tasks assessed/performed             Past Medical History:  Diagnosis Date   Anxiety    Arthritis of left knee    Bipolar disorder ([place])    Blood transfusion 1981   "related to my daughter being born"   Depression    Diabetes mellitus without complication ([place])    Fibroid tumor    Gout    Hypertension    Plantar fasciitis    Substance abuse ([place])    crack cocaine  14 yrs sober.    Past Surgical History:  Procedure Laterality Date   ABDOMINAL HYSTERECTOMY     "for fibroid tumors"    There were no vitals filed for this visit.    Subjective Assessment - 08/17/20 1013     Subjective Sydney Harris is a 55 y.o. female who presents to clinic with chief complaint of L knee pain.  MOI/History of condition: FROM EMR "Patient seen return follow-up notes her weight is down 7 pounds.  She is trying to follow a healthy diet but is still eating a lot of carbohydrates processed cereals sausage.  She is in a pain clinic now receiving Percocets for chronic pain of the left knee.  She has arthritis of the left knee and will need follow-up with orthopedic surgery for potential knee replacement in the future when she makes this decision." Pt had a fall about 20 years ago.  She has chronic knee pain and  swelling since this time.  She is considering L TKA, but is trying therapy first.  Pt reports she is "bone on bone".  Pain location: diffuse L knee pain.  Red flags: none.  48 hour pain intensity:  highest 8/10, current 5/10, best 3/10.  Aggs: walking (5 min), transfers, standing.  Eases: rest, pain medication.  Nature: sharp, throbbing.  Severity: high  Irritability: mod.    Stage: chronic.  Stability: staying the same.  24 hour pattern: worse with activity.  Vocation/requirements: disabled.  Hobbies: NA.  Functional limitations/goals: reduced pain with steps, walking, transfers.  Home environment: lives alone, friends and family nearby, 0 STE, 0 interior steps.  Assistive device: none.   Hand dominance: R.  Falls: no.  Referring provider: Elsie Stain, MD    Pertinent History Hx of gout, diabetes                [facility] PT Assessment - 08/17/20 0001       Assessment   Medical Diagnosis Chronic pain of left knee (M25.562, G89.29), Primary osteoarthritis of left knee [id number])    Referring Provider (PT) Elsie Stain, MD    Onset Date/Surgical Date 08/17/00    Hand Dominance  Right    Next MD Visit unknown    Prior Therapy none      Precautions   Precaution Comments Hx of gout, diabetes      Restrictions   Other Position/Activity Restrictions none      Balance Screen   Has the patient fallen in the past 6 months No      Observation/Other Assessments   Focus on Therapeutic Outcomes (FOTO)  54/62      Sensation   Light Touch Appears Intact      Functional Tests   Functional tests Other;Sit to Stand;Squat      Squat   Comments 45 degrees with pain      Sit to Stand   Comments 30'': 6x      Other:   Other/ Comments 10 m max gait speed: 1 m/s      ROM / Strength   AROM / PROM / Strength AROM;Strength      AROM   Overall AROM Comments R knee WFL, L knee 0-75 with pain      Strength   Overall Strength Comments unable to assess clearly d/t pain L knee 3/5     Strength Assessment Site Hip;Knee      Flexibility   Soft Tissue Assessment /Muscle Length yes    Hamstrings PSLR 45 degrees L      Palpation   Palpation comment diffuce tenderness L knee joint line      Ambulation/Gait   Gait Comments L antalgic gait, reduced step height, reduced step length, reduced heel off                        Objective measurements completed on examination: See above findings.               PT Education - 08/17/20 1014     Education Details POC, diagnosis, prognosis, HEP.  Pt educated via explanation, demonstration, and handout (HEP).  Pt confirms understanding verbally.              PT Short Term Goals - 08/17/20 1011       PT SHORT TERM GOAL #1   Title Sydney Harris will be >75% HEP compliant within 3 weeks to improve carryover between sessions and facilitate independent management of condition.    Target Date 09/07/20               PT Long Term Goals - 08/17/20 1011       PT LONG TERM GOAL #1   Title Sydney Harris will improve FOTO score from 54 (on evaluation) to 62 as a proxy for functional improvement    Target Date 10/12/20      PT LONG TERM GOAL #2   Title Sydney Harris will improve knee flexion in functional squat to 55 degrees to show improvement in ability to lift and/or pick up items from the ground  EVAL: 45 degrees    Target Date 10/12/20      PT LONG TERM GOAL #3   Title Sydney Harris will improve 30'' STS (MCID 2) to >/= 9x to show improved LE strength and improved transfers  EVAL: 6x    Target Date 10/12/20      PT LONG TERM GOAL #4   Title Sydney Harris will achieve 90 degrees knee flexion by D/C (see POC end date) to improve ability to safely navigate steps in the community  EVAL: 75 degrees  Target Date 10/12/20                    Plan - 08/17/20 1009     Clinical Impression Statement Sydney Harris is a 55 y.o. female  who presents to clinic with signs and sxs consistent with L knee pain d/t significant degeneration.  Pt presents with pain and impairments/deficits in: knee ROM, knee strength, gait.  Exam limited d/t high level of pain.  Activity limitations include: squatting, stairs, ambulation.  Participation limitations include: community navigation, housework, work.  Pt will benefit from skilled therapy to address pain and the listed deficits in order to achieve functional goals, enable safety and independence in completion of daily tasks, and return to PLOF.    Personal Factors and Comorbidities Comorbidity 2    Comorbidities See "Pertinent History" in "Subjective Assessment" and/or precuations    Stability/Clinical Decision Making Stable/Uncomplicated    Clinical Decision Making Low    Rehab Potential Fair    PT Frequency 1x / week    PT Duration 8 weeks    PT Treatment/Interventions --   ADLs/Self Care Home Management;Aquatic Therapy;Therapeutic activities;Therapeutic exercise;Neuromuscular re-education;Manual techniques;Iontophoresis '4mg'$ /ml Dexamethasone;Dry needling;Gait training;   PT Next Visit Plan gradual LE strengthening    PT Home Exercise Plan [id number]    Consulted and Agree with Plan of Care Patient             Patient will benefit from skilled therapeutic intervention in order to improve the following deficits and impairments:     Visit Diagnosis: Chronic pain of left knee  Other abnormalities of gait and mobility  Muscle weakness     Problem List Patient Active Problem List   Diagnosis Date Noted   HTN (hypertension) 03/28/2020   Chronic bilateral low back pain without sciatica 12/28/2019   Hyperlipidemia associated with type 2 diabetes mellitus ([place]) 12/22/2019   Chronic pain of left knee 12/21/2019   Type 2 diabetes mellitus with hyperglycemia, without long-term current use of insulin ([place]) 12/21/2019    Shearon Balo PT, DPT 08/17/20 10:19 AM   [place] [facility] [address] [place], [place], [address] Phone: [phone]   Fax:  [phone]  Name: Sydney Harris MRN: [id number] Date of Birth: January 24, 1965

## 2020-08-24 ENCOUNTER — Other Ambulatory Visit: Payer: Self-pay

## 2020-08-24 ENCOUNTER — Ambulatory Visit: Payer: Medicare Other | Attending: Critical Care Medicine | Admitting: Physical Therapy

## 2020-08-24 ENCOUNTER — Encounter: Payer: Self-pay | Admitting: Physical Therapy

## 2020-08-24 DIAGNOSIS — R2689 Other abnormalities of gait and mobility: Secondary | ICD-10-CM | POA: Diagnosis present

## 2020-08-24 DIAGNOSIS — M6281 Muscle weakness (generalized): Secondary | ICD-10-CM | POA: Diagnosis present

## 2020-08-24 DIAGNOSIS — G8929 Other chronic pain: Secondary | ICD-10-CM | POA: Insufficient documentation

## 2020-08-24 DIAGNOSIS — M25562 Pain in left knee: Secondary | ICD-10-CM | POA: Diagnosis present

## 2020-08-24 NOTE — Patient Instructions (Signed)
Access Code: [email or web address] URL: [email or web address] Date: 08/24/2020 Prepared by: Shearon Balo  Exercises Supine Quad Set - 3 x daily - 7 x weekly - 2 sets - 10 reps - 5'' hold Small Range Straight Leg Raise - 1 x daily - 7 x weekly - 3 sets - 10 reps

## 2020-08-24 NOTE — Therapy (Signed)
[place], [place], [address] Phone: [phone]   Fax:  [phone]  Physical Therapy Treatment  Patient Details  Name: Sydney Harris MRN: [id number] Date of Birth: 09-20-1965 Referring Provider (PT): Sydney Stain, MD   Encounter Date: 08/24/2020   PT End of Session - 08/24/20 1001     Visit Number 2    Number of Visits 8    Date for PT Re-Evaluation 10/12/20    Authorization Type MCR    Progress Note Due on Visit 10    PT Start Time 1000    PT Stop Time 1043    PT Time Calculation (min) 43 min    Activity Tolerance Patient tolerated treatment well    Behavior During Therapy Sydney Harris for tasks assessed/performed             Past Medical History:  Diagnosis Date   Anxiety    Arthritis of left knee    Bipolar disorder ([place])    Blood transfusion 1981   "related to my daughter being born"   Depression    Diabetes mellitus without complication ([place])    Fibroid tumor    Gout    Hypertension    Plantar fasciitis    Substance abuse ([place])    crack cocaine  14 yrs sober.    Past Surgical History:  Procedure Laterality Date   ABDOMINAL HYSTERECTOMY     "for fibroid tumors"    There were no vitals filed for this visit.   Subjective Assessment - 08/24/20 1003     Subjective Pt reports that she feels her knee is improving.  She has been HEP compliant.  She feels she is walking better.  L knee pain is currenlty 7/10.    Pertinent History Hx of gout, diabetes              [facility] Adult PT Treatment/Exercise:  Therapeutic Exercise: - nustep - L1 11mwhile taking subjective - slant board stretch - not tolerated - SAQ - 3x20 - quad setting into towel - 1x10 5'' hold - SLR with only heel lift - 2x10 - supine clamshell RTB - 2x10 - bridge w/o lift off - 2x10  Manual Therapy: - L distraction, pt sitting on edge of table, with gait belt     PT Short Term Goals - 08/17/20 1011        PT SHORT TERM GOAL #1   Title Sydney Harris will be >75% HEP compliant within 3 weeks to improve carryover between sessions and facilitate independent management of condition.    Target Date 09/07/20               PT Long Term Goals - 08/17/20 1011       PT LONG TERM GOAL #1   Title Sydney Harris will improve FOTO score from 54 (on evaluation) to 62 as a proxy for functional improvement    Target Date 10/12/20      PT LONG TERM GOAL #2   Title Sydney Harris will improve knee flexion in functional squat to 55 degrees to show improvement in ability to lift and/or pick up items from the ground  EVAL: 45 degrees    Target Date 10/12/20      PT LONG TERM GOAL #3   Title Sydney Harris will improve 30'' STS (MCID 2) to >/= 9x to show improved LE strength and improved transfers  EVAL: 6x    Target  Date 10/12/20      PT LONG TERM GOAL #4   Title Sydney Harris will achieve 90 degrees knee flexion by D/C (see POC end date) to improve ability to safely navigate steps in the community  EVAL: 75 degrees    Target Date 10/12/20                   Plan - 08/24/20 1043     Clinical Impression Statement Pt reports no increase in baseline pain following therapy  HEP was updated and reissued to patient    Overall, Sydney Harris is progressing well with therapy.  Today we concentrated on lower extremity strengthening and quad strengthening.  Pt has high level of irritability, but is able to complete the listed exercises with good form.  She does show significant quad fatigue.  We will continue to progress strength as tolerated.  Pt will continue to benefit from skilled physical therapy to address remaining deficits and achieve listed goals.  Continue per POC.    Personal Factors and Comorbidities Comorbidity 2    Comorbidities See "Pertinent History" in "Subjective Assessment" and/or precuations    Stability/Clinical Decision  Making Stable/Uncomplicated    Rehab Potential Fair    PT Frequency 1x / week    PT Duration 8 weeks    PT Treatment/Interventions --   ADLs/Self Care Home Management;Aquatic Therapy;Therapeutic activities;Therapeutic exercise;Neuromuscular re-education;Manual techniques;Iontophoresis '4mg'$ /ml Dexamethasone;Dry needling;Gait training;   PT Next Visit Plan gradual LE strengthening    PT Home Exercise Plan [id number]    Consulted and Agree with Plan of Care Patient             Patient will benefit from skilled therapeutic intervention in order to improve the following deficits and impairments:     Visit Diagnosis: Chronic pain of left knee  Muscle weakness     Problem List Patient Active Problem List   Diagnosis Date Noted   HTN (hypertension) 03/28/2020   Chronic bilateral low back pain without sciatica 12/28/2019   Hyperlipidemia associated with type 2 diabetes mellitus ([place]) 12/22/2019   Chronic pain of left knee 12/21/2019   Type 2 diabetes mellitus with hyperglycemia, without long-term current use of insulin ([place]) 12/21/2019    Sydney Harris PT, DPT 08/24/20 10:45 AM  [facility] [address] [place], [place], [address] Phone: [phone]   Fax:  [phone]  Name: Sydney Harris MRN: [id number] Date of Birth: 1965/11/18

## 2020-08-31 ENCOUNTER — Other Ambulatory Visit: Payer: Self-pay

## 2020-08-31 ENCOUNTER — Encounter: Payer: Self-pay | Admitting: Physical Therapy

## 2020-08-31 ENCOUNTER — Ambulatory Visit: Payer: Medicare Other | Admitting: Physical Therapy

## 2020-08-31 DIAGNOSIS — M25562 Pain in left knee: Secondary | ICD-10-CM | POA: Diagnosis not present

## 2020-08-31 DIAGNOSIS — R2689 Other abnormalities of gait and mobility: Secondary | ICD-10-CM

## 2020-08-31 DIAGNOSIS — G8929 Other chronic pain: Secondary | ICD-10-CM

## 2020-08-31 DIAGNOSIS — M6281 Muscle weakness (generalized): Secondary | ICD-10-CM

## 2020-08-31 NOTE — Patient Instructions (Signed)
Access Code: [email or web address] URL: [email or web address] Date: 08/31/2020 Prepared by: Shearon Balo  Exercises Supine Quad Set - 3 x daily - 7 x weekly - 2 sets - 10 reps - 5'' hold Small Range Straight Leg Raise - 1 x daily - 7 x weekly - 3 sets - 10 reps Supine Bridge - 1 x daily - 7 x weekly - 3 sets - 10 reps

## 2020-08-31 NOTE — Therapy (Signed)
[place], [place], [address] Phone: [phone]   Fax:  [phone]  Physical Therapy Treatment  Patient Details  Name: Sydney Harris MRN: [id number] Date of Birth: [birth year] Referring Provider (PT): Sydney Stain, MD   Encounter Date: 08/31/2020   PT End of Session - 08/31/20 0957     Visit Number 3    Number of Visits 8    Date for PT Re-Evaluation 10/12/20    Authorization Type MCR    Progress Note Due on Visit 10    PT Start Time 1000    PT Stop Time 1042    PT Time Calculation (min) 42 min    Activity Tolerance Patient tolerated treatment well    Behavior During Therapy [facility] for tasks assessed/performed             Past Medical History:  Diagnosis Date   Anxiety    Arthritis of left knee    Bipolar disorder ([place])    Blood transfusion 1981   "related to my daughter being born"   Depression    Diabetes mellitus without complication ([place])    Fibroid tumor    Gout    Hypertension    Plantar fasciitis    Substance abuse ([place])    crack cocaine  14 yrs sober.    Past Surgical History:  Procedure Laterality Date   ABDOMINAL HYSTERECTOMY     "for fibroid tumors"    There were no vitals filed for this visit.   Subjective Assessment - 08/31/20 1004     Subjective Pt reports that she feels that her knee is doing better.  She feels like ambulation is improving.  She reports "it feels good when I work it".  L knee pain is around a 3/10 L knee.  Aggs: walking Eases: exercise.    Pertinent History Hx of gout, diabetes                [facility] PT Assessment - 08/31/20 0001       AROM   Overall AROM Comments L knee 0-107 w/ pain                                   [facility] Adult PT Treatment/Exercise:   Therapeutic Exercise, to improve L knee strength and reduce pain; pt cued for form and pacing: - nustep - L3 107mwhile taking subjective - G/S  stretch with strap - 20x - SAQ - 3x20 - 2# - SLR - 2x10 - supine clamshell GTB - 2x10 - bridge w/ small medium lift off - 2x10   Manual Therapy: - L distraction, pt sitting on edge of table, with gait belt   PT Short Term Goals - 08/31/20 1024       PT SHORT TERM GOAL #1   Title Sydney Harris will be >75% HEP compliant within 3 weeks to improve carryover between sessions and facilitate independent management of condition.    Status Achieved    Target Date 09/07/20               PT Long Term Goals - 08/17/20 1011       PT LONG TERM GOAL #1   Title Sydney Harris will improve FOTO score from 54 (on evaluation) to 62 as a proxy for functional improvement    Target Date 10/12/20      PT LONG  TERM GOAL #2   Title Sydney Harris will improve knee flexion in functional squat to 55 degrees to show improvement in ability to lift and/or pick up items from the ground  EVAL: 45 degrees    Target Date 10/12/20      PT LONG TERM GOAL #3   Title Sydney Harris will improve 30'' STS (MCID 2) to >/= 9x to show improved LE strength and improved transfers  EVAL: 6x    Target Date 10/12/20      PT LONG TERM GOAL #4   Title Sydney Harris will achieve 90 degrees knee flexion by D/C (see POC end date) to improve ability to safely navigate steps in the community  EVAL: 75 degrees    Target Date 10/12/20                   Plan - 08/31/20 1026     Clinical Impression Statement Pt reports a mild increase in pain following therapy  HEP was updated and reissued   Overall, Sydney Harris is progressing well with therapy.  Today we concentrated on quad strengthening and hip strengthening.  Pt is able to progress SAQ with weight added.  She fatigues rapidly in L quad with SAQ but tolerates well.  Significant improvement with bridge and SLR today.  Pt will continue to benefit from skilled physical therapy to address remaining deficits and  achieve listed goals.  Continue per POC.    Personal Factors and Comorbidities Comorbidity 2    Comorbidities See "Pertinent History" in "Subjective Assessment" and/or precuations    Stability/Clinical Decision Making Stable/Uncomplicated    Rehab Potential Fair    PT Frequency 1x / week    PT Duration 8 weeks    PT Treatment/Interventions ADLs/Self Care Home Management;Aquatic Therapy;Gait training;Therapeutic activities;Therapeutic exercise;Neuromuscular re-education;Manual techniques   ADLs/Self Care Home Management;Aquatic Therapy;Therapeutic activities;Therapeutic exercise;Neuromuscular re-education;Manual techniques;Iontophoresis '4mg'$ /ml Dexamethasone;Dry needling;Gait training;   PT Next Visit Plan gradual LE strengthening    PT Home Exercise Plan [id number]    Consulted and Agree with Plan of Care Patient             Patient will benefit from skilled therapeutic intervention in order to improve the following deficits and impairments:  Abnormal gait, Pain, Decreased range of motion, Decreased strength  Visit Diagnosis: Chronic pain of left knee  Muscle weakness  Other abnormalities of gait and mobility     Problem List Patient Active Problem List   Diagnosis Date Noted   HTN (hypertension) 03/28/2020   Chronic bilateral low back pain without sciatica 12/28/2019   Hyperlipidemia associated with type 2 diabetes mellitus ([place]) 12/22/2019   Chronic pain of left knee 12/21/2019   Type 2 diabetes mellitus with hyperglycemia, without long-term current use of insulin ([place]) 12/21/2019    Sydney Harris 08/31/2020, 10:41 AM  [facility] [address] [place], [place], [address] Phone: [phone]   Fax:  [phone]  Name: Sydney Harris MRN: [id number] Date of Birth: [birth year]

## 2020-09-07 ENCOUNTER — Encounter: Payer: Medicare Other | Admitting: Physical Therapy

## 2020-09-14 ENCOUNTER — Ambulatory Visit: Payer: Medicare Other | Admitting: Physical Therapy

## 2020-09-21 ENCOUNTER — Ambulatory Visit: Payer: Medicare Other | Attending: Critical Care Medicine | Admitting: Physical Therapy

## 2020-09-21 ENCOUNTER — Other Ambulatory Visit: Payer: Self-pay

## 2020-09-21 ENCOUNTER — Encounter: Payer: Self-pay | Admitting: Physical Therapy

## 2020-09-21 DIAGNOSIS — R2689 Other abnormalities of gait and mobility: Secondary | ICD-10-CM | POA: Insufficient documentation

## 2020-09-21 DIAGNOSIS — M6281 Muscle weakness (generalized): Secondary | ICD-10-CM | POA: Diagnosis present

## 2020-09-21 DIAGNOSIS — M25562 Pain in left knee: Secondary | ICD-10-CM | POA: Diagnosis not present

## 2020-09-21 DIAGNOSIS — G8929 Other chronic pain: Secondary | ICD-10-CM | POA: Insufficient documentation

## 2020-09-21 NOTE — Patient Instructions (Signed)
Access Code: [email or web address] URL: [email or web address] Date: 09/21/2020 Prepared by: Shearon Balo  Exercises Supine Bridge - 1 x daily - 7 x weekly - 3 sets - 10 reps - 5'' hold Hooklying Clamshell with Resistance - 1 x daily - 7 x weekly - 3 sets - 10 reps Supine Active Straight Leg Raise - 1 x daily - 7 x weekly - 3 sets - 10 reps - 5'' hold

## 2020-09-21 NOTE — Therapy (Signed)
[place], [place], [address] Phone: [phone]   Fax:  [phone]  Physical Therapy Treatment  Patient Details  Name: Sydney Harris MRN: [id number] Date of Birth: 1965/12/19 Referring Provider (PT): Elsie Stain, MD   Encounter Date: 09/21/2020   PT End of Session - 09/21/20 0959     Visit Number 4    Number of Visits 8    Date for PT Re-Evaluation 10/12/20    Authorization Type MCR    Progress Note Due on Visit 10    PT Start Time 1000    PT Stop Time 1041    PT Time Calculation (min) 41 min    Activity Tolerance Patient tolerated treatment well    Behavior During Therapy [facility] for tasks assessed/performed             Past Medical History:  Diagnosis Date   Anxiety    Arthritis of left knee    Bipolar disorder ([place])    Blood transfusion 1981   "related to my daughter being born"   Depression    Diabetes mellitus without complication ([place])    Fibroid tumor    Gout    Hypertension    Plantar fasciitis    Substance abuse ([place])    crack cocaine  14 yrs sober.    Past Surgical History:  Procedure Laterality Date   ABDOMINAL HYSTERECTOMY     "for fibroid tumors"    There were no vitals filed for this visit.   Subjective Assessment - 09/21/20 1004     Subjective Pt reports that she feels that her knee continues to improve.  She feels her gait is improving.  She is HEP compliant.  L knee pain is around a 4/10 L knee.  Aggs: walking Eases: exercise.    Pertinent History Hx of gout, diabetes             [facility] Adult PT Treatment/Exercise:   Therapeutic Exercise, to improve L knee strength and reduce pain; pt cued for form and pacing: - nustep - L6 39mwhile taking subjective - G/S stretch with strap - 3x30'' - LAQ - 3x10 - 10# - SLR - 2x10 - supine clamshell Black TB - 2x10 3'' - bridge - 2x10 - 5'' hold  Neuro re-ed to improve balance and proprioception to reduce  fall risk and improve stability in stance:  - semi tandem stane - 30'' bouts 3x ea - rocker board 20x ea - CGA   Manual Therapy(not today): - L distraction, pt sitting on edge of table, with gait belt    PT Short Term Goals - 08/31/20 1024       PT SHORT TERM GOAL #1   Title Lian Jocile Buitron will be >75% HEP compliant within 3 weeks to improve carryover between sessions and facilitate independent management of condition.    Status Achieved    Target Date 09/07/20               PT Long Term Goals - 08/17/20 1011       PT LONG TERM GOAL #1   Title [name] will improve FOTO score from 54 (on evaluation) to 62 as a proxy for functional improvement    Target Date 10/12/20      PT LONG TERM GOAL #2   Title [name] will improve knee flexion in functional squat to 55 degrees to show improvement in ability to lift and/or pick up  items from the ground  EVAL: 45 degrees    Target Date 10/12/20      PT LONG TERM GOAL #3   Title [name] will improve 30'' STS (MCID 2) to >/= 9x to show improved LE strength and improved transfers  EVAL: 6x    Target Date 10/12/20      PT LONG TERM GOAL #4   Title [name] will achieve 90 degrees knee flexion by D/C (see POC end date) to improve ability to safely navigate steps in the community  EVAL: 75 degrees    Target Date 10/12/20                   Plan - 09/21/20 1030     Clinical Impression Statement [name] continues to show improvement in pain and strength.  We updated HEP.  We will continue progressive LE strengthening with integration of balance as able.  She shows clear deficit in balance L > R in balance today.  Continue per POC.    Personal Factors and Comorbidities Comorbidity 2    Comorbidities See "Pertinent History" in "Subjective Assessment" and/or precuations    Stability/Clinical Decision Making Stable/Uncomplicated    Rehab Potential Fair     PT Frequency 1x / week    PT Duration 8 weeks    PT Treatment/Interventions ADLs/Self Care Home Management;Aquatic Therapy;Gait training;Therapeutic activities;Therapeutic exercise;Neuromuscular re-education;Manual techniques   ADLs/Self Care Home Management;Aquatic Therapy;Therapeutic activities;Therapeutic exercise;Neuromuscular re-education;Manual techniques;Iontophoresis '4mg'$ /ml Dexamethasone;Dry needling;Gait training;   PT Next Visit Plan gradual LE strengthening    PT Home Exercise Plan [id number]    Consulted and Agree with Plan of Care Patient             Patient will benefit from skilled therapeutic intervention in order to improve the following deficits and impairments:  Abnormal gait, Pain, Decreased range of motion, Decreased strength  Visit Diagnosis: Chronic pain of left knee  Muscle weakness  Other abnormalities of gait and mobility     Problem List Patient Active Problem List   Diagnosis Date Noted   HTN (hypertension) 03/28/2020   Chronic bilateral low back pain without sciatica 12/28/2019   Hyperlipidemia associated with type 2 diabetes mellitus ([place]) 12/22/2019   Chronic pain of left knee 12/21/2019   Type 2 diabetes mellitus with hyperglycemia, without long-term current use of insulin ([place]) 12/21/2019    Shearon Balo PT, DPT 09/21/20 10:43 AM  [facility] [address] [place], [place], [address] Phone: [phone]   Fax:  [phone]  Name: Sydney Harris MRN: [id number] Date of Birth: 18-Dec-1965

## 2020-09-27 ENCOUNTER — Telehealth: Payer: Self-pay | Admitting: Physical Therapy

## 2020-09-27 ENCOUNTER — Ambulatory Visit: Payer: Medicare Other | Admitting: Physical Therapy

## 2020-09-27 NOTE — Telephone Encounter (Signed)
Pt called and she responded that she did not know she was scheduled today.  Pt was told her next appt on 10-02-20 at 10:00 and also reminded of attendance /cancellation policy.  Voncille Lo, PT, [place] Certified Exercise Expert for the Aging Adult  09/27/20 11:52 AM Phone: [phone] Fax: [phone]

## 2020-10-02 ENCOUNTER — Other Ambulatory Visit: Payer: Self-pay

## 2020-10-02 ENCOUNTER — Encounter: Payer: Self-pay | Admitting: Physical Therapy

## 2020-10-02 ENCOUNTER — Ambulatory Visit: Payer: Medicare Other | Admitting: Physical Therapy

## 2020-10-02 DIAGNOSIS — M25562 Pain in left knee: Secondary | ICD-10-CM | POA: Diagnosis not present

## 2020-10-02 DIAGNOSIS — R2689 Other abnormalities of gait and mobility: Secondary | ICD-10-CM

## 2020-10-02 DIAGNOSIS — M6281 Muscle weakness (generalized): Secondary | ICD-10-CM

## 2020-10-02 DIAGNOSIS — G8929 Other chronic pain: Secondary | ICD-10-CM

## 2020-10-02 NOTE — Therapy (Addendum)
[place], [place], [address] Phone: [phone]   Fax:  [phone]  PHYSICAL THERAPY UNPLANNED DISCHARGE SUMMARY   Visits from Start of Care: 5  Current functional level related to goals / functional outcomes: Current status unknown   Remaining deficits: Current status unknown   Education / Equipment: Pt has not returned since visit listed below  Patient goals were not assessed. Patient is being discharged due to not returning since the last visit.  (the below note was addended to include the above D/C summary on 11/01/20)   Physical Therapy Treatment  Patient Details  Name: Sydney Harris MRN: [id number] Date of Birth: February 17, 1965 Referring Provider (PT): Elsie Stain, MD   Encounter Date: 10/02/2020   PT End of Session - 10/02/20 0958     Visit Number 5    Number of Visits 8    Date for PT Re-Evaluation 10/12/20    Authorization Type MCR    Progress Note Due on Visit 10    PT Start Time 1000    PT Stop Time 1042    PT Time Calculation (min) 42 min    Activity Tolerance Patient tolerated treatment well    Behavior During Therapy [facility] for tasks assessed/performed             Past Medical History:  Diagnosis Date   Anxiety    Arthritis of left knee    Bipolar disorder ([place])    Blood transfusion 1981   "related to my daughter being born"   Depression    Diabetes mellitus without complication ([place])    Fibroid tumor    Gout    Hypertension    Plantar fasciitis    Substance abuse ([place])    crack cocaine  14 yrs sober.    Past Surgical History:  Procedure Laterality Date   ABDOMINAL HYSTERECTOMY     "for fibroid tumors"    There were no vitals filed for this visit.   Subjective Assessment - 10/02/20 1004     Subjective Pt reports continued improvement in her L knee pain.  She has been riding a bike outside for short distances.  She is HEP compliant.  L knee pain  is around a 4/10 L knee.  Aggs: walking Eases: exercise.    Pertinent History Hx of gout, diabetes                [facility] PT Assessment - 10/02/20 0001       Observation/Other Assessments   Focus on Therapeutic Outcomes (FOTO)  35            [facility] Adult PT Treatment/Exercise:   Therapeutic Exercise, to improve L knee strength and reduce pain; pt cued for form and pacing: - nustep - L6  while taking subjective - G/S stretch with strap - 3x30'' - LAQ - x10 - 10# - SLR - 2x10 - supine clamshell Black TB - 2x10 3'' - bridge - x10 - 6'' hold - wall squat - 4x10  Therapeutic Activity - collecting information for FOTO and reviewing with patient   Manual Therapy : - L distraction, pt sitting on edge of table, with gait belt     PT Short Term Goals - 08/31/20 1024       PT SHORT TERM GOAL #1   Title Kirstyn Jocile Krolak will be >75% HEP compliant within 3 weeks to improve carryover between sessions and facilitate independent management of condition.    Status  Achieved    Target Date 09/07/20               PT Long Term Goals - 10/02/20 1017       PT LONG TERM GOAL #1   Title Arrionna Jocile Loflin will improve FOTO score from 54 (on evaluation) to 62 as a proxy for functional improvement    Baseline 9/13: 72    Status Achieved      PT LONG TERM GOAL #2   Title Anthonia Jocile Buckel will improve knee flexion in functional squat to 55 degrees to show improvement in ability to lift and/or pick up items from the ground  EVAL: 45 degrees      PT LONG TERM GOAL #3   Title Doylene Jocile Reyburn will improve 30'' STS (MCID 2) to >/= 9x to show improved LE strength and improved transfers  EVAL: 6x      PT LONG TERM GOAL #4   Title Jahnai Jocile Picariello will achieve 90 degrees knee flexion by D/C (see POC end date) to improve ability to safely navigate steps in the community  EVAL: 75 degrees                   Plan - 10/02/20 1023     Clinical  Impression Statement Pt reports no increase in baseline pain following therapy  HEP was reviewed, but left unchanged    Overall, Jaylen Jocile Posner is progressing well with therapy.  Today we concentrated on quad strengthening and hip strengthening.  Pt shows significant improvement in FOTO score to 72.  We will begin working on squatting in the next several visits.  Pt will continue to benefit from skilled physical therapy to address remaining deficits and achieve listed goals.  Continue per POC.    Personal Factors and Comorbidities Comorbidity 2    Comorbidities See "Pertinent History" in "Subjective Assessment" and/or precuations    Stability/Clinical Decision Making Stable/Uncomplicated    Rehab Potential Fair    PT Frequency 1x / week    PT Duration 8 weeks    PT Treatment/Interventions ADLs/Self Care Home Management;Aquatic Therapy;Gait training;Therapeutic activities;Therapeutic exercise;Neuromuscular re-education;Manual techniques   ADLs/Self Care Home Management;Aquatic Therapy;Therapeutic activities;Therapeutic exercise;Neuromuscular re-education;Manual techniques;Iontophoresis '4mg'$ /ml Dexamethasone;Dry needling;Gait training;   PT Next Visit Plan gradual LE strengthening    PT Home Exercise Plan [id number]    Consulted and Agree with Plan of Care Patient             Patient will benefit from skilled therapeutic intervention in order to improve the following deficits and impairments:  Abnormal gait, Pain, Decreased range of motion, Decreased strength  Visit Diagnosis: Chronic pain of left knee  Muscle weakness  Other abnormalities of gait and mobility     Problem List Patient Active Problem List   Diagnosis Date Noted   HTN (hypertension) 03/28/2020   Chronic bilateral low back pain without sciatica 12/28/2019   Hyperlipidemia associated with type 2 diabetes mellitus ([place]) 12/22/2019   Chronic pain of left knee 12/21/2019   Type 2 diabetes mellitus with  hyperglycemia, without long-term current use of insulin ([place]) 12/21/2019    Mathis Dad, PT 10/02/2020, 10:41 AM  [facility] [address] [place], [place], [address] Phone: [phone]   Fax:  [phone]  Name: Amarri Isles MRN: [id number] Date of Birth: October 09, 1965

## 2020-10-04 ENCOUNTER — Ambulatory Visit: Payer: Medicare Other | Admitting: Physical Therapy

## 2020-10-09 ENCOUNTER — Ambulatory Visit: Payer: Medicare Other | Admitting: Physical Therapy

## 2020-10-11 ENCOUNTER — Ambulatory Visit: Payer: Medicare Other | Admitting: Physical Therapy

## 2020-11-04 NOTE — Progress Notes (Signed)
Subjective:    Patient ID: Sydney Harris, female    DOB: December 23, 1965, 55 y.o.   MRN: [id number] Virtual Visit via Telephone Note  I connected with Sydney Harris on 11/05/20 at  9:00 AM EDT by telephone and verified that I am speaking with the correct person using two identifiers.   Consent:  I discussed the limitations, risks, security and privacy concerns of performing an evaluation and management service by telephone and the availability of in person appointments. I also discussed with the patient that there may be a patient responsible charge related to this service. The patient expressed understanding and agreed to proceed.  Location of patient: Patient's at home  Location of provider: I am in my office  Persons participating in the televisit with the patient.    No one else on the call   History of Present Illness:   55 y.o.F here to est PCP    12/21/19 This patient was formally followed by another primary care provider in the region but has not been seen recently.  Patient did go to the urgent care recently with a chronic cough for 2 months given cough suppressants and prednisone with minimal improvement.  Note the patient is on an ACE inhibitor.  On arrival A1c is 6.5 blood glucose greater than 100.  Note prior diagnoses of bipolar disorder and schizophrenia and she is followed by [facility] and is on multiple mental health medications.  The patient has multiple primary care gaps  She has received her Covid vaccine the last one in mid June.  Patient needs a tetanus shot but wants to hold off on this she is already received her flu vaccine recently she is in need of hepatitis C screening colonoscopy and Pap smear  The patient has no other specific complaints except for severe left knee pain this been a concern for some time  03/28/2020 Patient returns in short-term follow-up on arrival blood glucose is 110 A1c is 6.0.  Patient's been maintaining Metformin and  maintaining an adequate diet.  Patient's cough is now resolved since we switched her from Zestoretic to losartan HCT and blood pressure on arrival is good at 123/86  Patient complains of severe left knee pain is seen orthopedics she has bone-on-bone needs a knee replacement she is pondering this at this time.  Patient is requesting pain management for this.  She states tramadol Tylenol 3 have not helped Advil does not help.  She has previously been informed that we do not do chronic pain management in this clinic Note she has a pending colonoscopy and has been scheduled for later this month  Wt Readings from Last 3 Encounters: 03/28/20 : 217 lb (98.4 kg) 03/16/20 : 213 lb 3.2 oz (96.7 kg) 03/13/20 : 215 lb 6.4 oz (97.7 kg)  7/11 Patient seen return follow-up notes her weight is down 7 pounds.  She is trying to follow a healthy diet but is still eating a lot of carbohydrates processed cereals sausage.  She is in a pain clinic now receiving Percocets for chronic pain of the left knee.  She has arthritis of the left knee and will need follow-up with orthopedic surgery for potential knee replacement in the future when she makes this decision.  Breathing is stable at this time.  She has no other complaints.  10/17 This is a brief phone visit.  This is a 55 year old female seen in follow-up from prior visit in July.  She has chronic left knee pain from  osteoarthritis and she is in with a pain management clinic receiving low-dose Percocet and baclofen.  Patient also has had physical therapy which is improved her status and she is lost weight as well.  She needs a knee replacement but she is wanting to hold off on this.  Patient does need an eye exam for diabetes and will need to be referred for this.  Patient has no other real complaints at this visit.  Her blood pressure has been well controlled at home.  Blood sugars have been stable as well and her A1c is 6.4.  Patient does need refills on several of  her medications these will be addressed.    Past Medical History:  Diagnosis Date   Anxiety    Arthritis of left knee    Bipolar disorder ([place])    Blood transfusion 1981   "related to my daughter being born"   Depression    Diabetes mellitus without complication ([place])    Fibroid tumor    Gout    Hypertension    Plantar fasciitis    Substance abuse ([place])    crack cocaine  14 yrs sober.     Family History  Problem Relation Age of Onset   Diabetes Father    Asthma Daughter    Heart failure Neg Hx    Cancer Neg Hx    Colon polyps Neg Hx    Colon cancer Neg Hx    Esophageal cancer Neg Hx    Rectal cancer Neg Hx    Stomach cancer Neg Hx      Social History   Socioeconomic History   Marital status: Single    Spouse name: Not on file   Number of children: Not on file   Years of education: Not on file   Highest education level: Not on file  Occupational History   Not on file  Tobacco Use   Smoking status: Former    Packs/day: 0.50    Years: 33.00    Pack years: 16.50    Types: Cigarettes    Quit date: 2018    Years since quitting: 4.7   Smokeless tobacco: Never  Vaping Use   Vaping Use: Never used  Substance and Sexual Activity   Alcohol use: No   Drug use: Yes    Types: "Crack" cocaine, Marijuana    Comment: 06/30/2013 "last used crack ~ 03/2013"   Sexual activity: Never  Other Topics Concern   Not on file  Social History Narrative   Not on file   Social Determinants of Health   Financial Resource Strain: Not on file  Food Insecurity: Not on file  Transportation Needs: Not on file  Physical Activity: Not on file  Stress: Not on file  Social Connections: Not on file  Intimate Partner Violence: Not on file     Allergies  Allergen Reactions   Penicillins Hives    Has patient had a PCN reaction causing immediate rash, facial/tongue/throat swelling, SOB or lightheadedness with hypotension: Yes Has patient had a PCN reaction causing severe rash  involving mucus membranes or skin necrosis: Yes Has patient had a PCN reaction that required hospitalization No Has patient had a PCN reaction occurring within the last 10 years: Yes If all of the above answers are "NO", then may proceed with Cephalosporin use.    Tomato     hives     Outpatient Medications Prior to Visit  Medication Sig Dispense Refill   Accu-Chek Softclix Lancets lancets Use to  measure blood sugar twice a day. E11.65 100 each 6   Blood Glucose Monitoring Suppl (ACCU-CHEK GUIDE ME) w/Device KIT Use to measure blood sugar twice a day. E11.65 1 kit 0   glucose blood (ACCU-CHEK GUIDE) test strip Use to measure blood sugar twice a day. E11.65 100 each 6   guaiFENesin (ROBITUSSIN) 100 MG/5ML SOLN Take 5 mLs (100 mg total) by mouth every 4 (four) hours as needed for cough or to loosen phlegm. 236 mL 0   Multiple Vitamin (MULTIVITAMIN ADULT PO) Take by mouth daily.     Multiple Vitamins-Minerals (HAIR SKIN AND NAILS FORMULA PO) Take by mouth daily.     naloxone (NARCAN) nasal spray 4 mg/0.1 mL SMARTSIG:Both Nares     oxyCODONE-acetaminophen (PERCOCET) 10-325 MG tablet Take 1 tablet by mouth 4 (four) times daily as needed.     ARIPiprazole (ABILIFY) 15 MG tablet Take 15 mg by mouth at bedtime.     atorvastatin (LIPITOR) 10 MG tablet Take 1 tablet (10 mg total) by mouth daily. 90 tablet 1   baclofen (LIORESAL) 10 MG tablet Take 10 mg by mouth 3 (three) times daily.     benztropine (COGENTIN) 0.5 MG tablet Take 0.5 mg by mouth at bedtime.      cloNIDine (CATAPRES) 0.1 MG tablet Take 0.1 mg by mouth 2 (two) times daily.     escitalopram (LEXAPRO) 5 MG tablet Take 5 mg by mouth daily.     losartan-hydrochlorothiazide (HYZAAR) 50-12.5 MG tablet Take 1 tablet by mouth daily. 90 tablet 3   metFORMIN (GLUCOPHAGE) 500 MG tablet Take 1 tablet (500 mg total) by mouth daily with breakfast. 60 tablet 3   omeprazole (PRILOSEC) 40 MG capsule Take 1 capsule (40 mg total) by mouth daily. 60  capsule 3   prazosin (MINIPRESS) 1 MG capsule Take 1 mg by mouth at bedtime.     traZODone (DESYREL) 100 MG tablet Take 100 mg by mouth at bedtime as needed. Take 1 & 1/2 tablets by mouth every night as needed     Vitamin D, Ergocalciferol, (DRISDOL) 1.25 MG (50000 UNIT) CAPS capsule Take 50,000 Units by mouth once a week.     No facility-administered medications prior to visit.     Review of Systems  Constitutional:  Negative for chills, diaphoresis and fever.  HENT:  Negative for congestion, hearing loss, nosebleeds, sore throat and tinnitus.   Eyes:  Negative for photophobia and redness.  Respiratory:  Negative for cough, shortness of breath, wheezing and stridor.   Cardiovascular:  Negative for chest pain, palpitations and leg swelling.  Gastrointestinal:  Negative for abdominal pain, blood in stool, constipation, diarrhea, nausea and vomiting.  Endocrine: Negative for polydipsia.  Genitourinary:  Negative for dysuria, flank pain, frequency, hematuria and urgency.  Musculoskeletal:  Positive for joint swelling. Negative for back pain, myalgias and neck pain.       Left knee pain  Skin:  Negative for rash.  Allergic/Immunologic: Negative for environmental allergies.  Neurological:  Negative for dizziness, tremors, seizures, weakness and headaches.  Hematological:  Does not bruise/bleed easily.  Psychiatric/Behavioral:  Negative for sleep disturbance and suicidal ideas. The patient is not nervous/anxious.       Objective:   Physical Exam There were no vitals filed for this visit.   No exam this is a phone visit No results found.        Assessment & Plan:  I personally reviewed all images and lab data in the [facility] system as well as  any outside material available during this office visit and agree with the  radiology impressions.   HTN (hypertension) Well-controlled no change in medications  Type 2 diabetes mellitus with hyperglycemia, without long-term current use of  insulin (HCC) Patient at goal with A1c at 6.4 continue metformin daily  Hyperlipidemia associated with type 2 diabetes mellitus (HCC) Continue low-dose atorvastatin  Chronic pain of left knee Encouraged follow up with orthopedics and pain management   Diagnoses and all orders for this visit:  Type 2 diabetes mellitus with hyperglycemia, without long-term current use of insulin ([place]) -     Ambulatory referral to Ophthalmology  Primary hypertension  Hyperlipidemia associated with type 2 diabetes mellitus ([place])  Chronic pain of left knee  Other orders -     ARIPiprazole (ABILIFY) 15 MG tablet; Take 1 tablet (15 mg total) by mouth at bedtime. -     atorvastatin (LIPITOR) 10 MG tablet; Take 1 tablet (10 mg total) by mouth daily. -     baclofen (LIORESAL) 10 MG tablet; Take 1 tablet (10 mg total) by mouth 3 (three) times daily. -     benztropine (COGENTIN) 0.5 MG tablet; Take 1 tablet (0.5 mg total) by mouth at bedtime. -     cloNIDine (CATAPRES) 0.1 MG tablet; Take 1 tablet (0.1 mg total) by mouth 2 (two) times daily. -     escitalopram (LEXAPRO) 5 MG tablet; Take 1 tablet (5 mg total) by mouth daily. -     losartan-hydrochlorothiazide (HYZAAR) 50-12.5 MG tablet; Take 1 tablet by mouth daily. -     metFORMIN (GLUCOPHAGE) 500 MG tablet; Take 1 tablet (500 mg total) by mouth daily with breakfast. -     omeprazole (PRILOSEC) 40 MG capsule; Take 1 capsule (40 mg total) by mouth daily. -     prazosin (MINIPRESS) 1 MG capsule; Take 1 capsule (1 mg total) by mouth at bedtime. -     traZODone (DESYREL) 100 MG tablet; Take 1 tablet (100 mg total) by mouth at bedtime as needed. Take 1 & 1/2 tablets by mouth every night as needed -     Vitamin D, Ergocalciferol, (DRISDOL) 1.25 MG (50000 UNIT) CAPS capsule; Take 1 capsule (50,000 Units total) by mouth once a week.  Follow Up Instructions: Patient knows a direct follow-up exam will occur in the next 8 weeks and referral to ophthalmology will be  made   I discussed the assessment and treatment plan with the patient. The patient was provided an opportunity to ask questions and all were answered. The patient agreed with the plan and demonstrated an understanding of the instructions.   The patient was advised to call back or seek an in-person evaluation if the symptoms worsen or if the condition fails to improve as anticipated.  I provided 28 minutes of non-face-to-face time during this encounter  including  median intraservice time , review of notes, labs, imaging, medications  and explaining diagnosis and management to the patient .    Asencion Noble, MD

## 2020-11-05 ENCOUNTER — Encounter: Payer: Self-pay | Admitting: Critical Care Medicine

## 2020-11-05 ENCOUNTER — Ambulatory Visit: Payer: Medicare Other | Attending: Critical Care Medicine | Admitting: Critical Care Medicine

## 2020-11-05 ENCOUNTER — Other Ambulatory Visit: Payer: Self-pay

## 2020-11-05 DIAGNOSIS — I10 Essential (primary) hypertension: Secondary | ICD-10-CM

## 2020-11-05 DIAGNOSIS — E785 Hyperlipidemia, unspecified: Secondary | ICD-10-CM

## 2020-11-05 DIAGNOSIS — E1165 Type 2 diabetes mellitus with hyperglycemia: Secondary | ICD-10-CM

## 2020-11-05 DIAGNOSIS — G8929 Other chronic pain: Secondary | ICD-10-CM

## 2020-11-05 DIAGNOSIS — E1169 Type 2 diabetes mellitus with other specified complication: Secondary | ICD-10-CM

## 2020-11-05 DIAGNOSIS — M25562 Pain in left knee: Secondary | ICD-10-CM | POA: Diagnosis not present

## 2020-11-05 MED ORDER — LOSARTAN POTASSIUM-HCTZ 50-12.5 MG PO TABS
1.0000 | ORAL_TABLET | Freq: Every day | ORAL | 3 refills | Status: DC
Start: 2020-11-05 — End: 2020-12-25

## 2020-11-05 MED ORDER — OMEPRAZOLE 40 MG PO CPDR: 40.0000 mg | DELAYED_RELEASE_CAPSULE | Freq: Every day | ORAL | 3 refills | Status: DC

## 2020-11-05 MED ORDER — ARIPIPRAZOLE 15 MG PO TABS: 15.0000 mg | ORAL_TABLET | Freq: Every day | ORAL | 4 refills | Status: DC

## 2020-11-05 MED ORDER — TRAZODONE HCL 100 MG PO TABS: 100.0000 mg | ORAL_TABLET | Freq: Every evening | ORAL | 2 refills | Status: DC | PRN

## 2020-11-05 MED ORDER — ESCITALOPRAM OXALATE 5 MG PO TABS: 5.0000 mg | ORAL_TABLET | Freq: Every day | ORAL | 4 refills | Status: DC

## 2020-11-05 MED ORDER — BENZTROPINE MESYLATE 0.5 MG PO TABS: 0.5000 mg | ORAL_TABLET | Freq: Every day | ORAL | 4 refills | Status: DC

## 2020-11-05 MED ORDER — BACLOFEN 10 MG PO TABS: 10.0000 mg | ORAL_TABLET | Freq: Three times a day (TID) | ORAL | 3 refills | Status: DC

## 2020-11-05 MED ORDER — CLONIDINE HCL 0.1 MG PO TABS: 0.1000 mg | ORAL_TABLET | Freq: Two times a day (BID) | ORAL | 4 refills | Status: DC

## 2020-11-05 MED ORDER — PRAZOSIN HCL 1 MG PO CAPS: 1.0000 mg | ORAL_CAPSULE | Freq: Every day | ORAL | 4 refills | Status: DC

## 2020-11-05 MED ORDER — VITAMIN D (ERGOCALCIFEROL) 1.25 MG (50000 UNIT) PO CAPS: 50000.0000 [IU] | ORAL_CAPSULE | ORAL | 4 refills | Status: DC

## 2020-11-05 MED ORDER — ATORVASTATIN CALCIUM 10 MG PO TABS
10.0000 mg | ORAL_TABLET | Freq: Every day | ORAL | 1 refills | Status: DC
Start: 2020-11-05 — End: 2020-12-25

## 2020-11-05 MED ORDER — METFORMIN HCL 500 MG PO TABS: 500.0000 mg | ORAL_TABLET | Freq: Every day | ORAL | 3 refills | Status: DC

## 2020-11-05 NOTE — Assessment & Plan Note (Signed)
Encouraged follow up with orthopedics and pain management

## 2020-11-05 NOTE — Assessment & Plan Note (Signed)
Continue low dose atorvastatin  

## 2020-11-05 NOTE — Assessment & Plan Note (Addendum)
Patient at goal with A1c at 6.4 continue metformin daily  Referral to [facility] ophthalmology for diabetic retinal exam was made

## 2020-11-05 NOTE — Assessment & Plan Note (Signed)
Well-controlled no change in medications

## 2020-11-07 ENCOUNTER — Telehealth: Payer: Self-pay | Admitting: Critical Care Medicine

## 2020-11-07 MED ORDER — TRAZODONE HCL 100 MG PO TABS: 150.0000 mg | ORAL_TABLET | Freq: Every evening | ORAL | 2 refills | Status: DC | PRN

## 2020-11-07 NOTE — Telephone Encounter (Signed)
Let [facility] know I resent proper Rx   I see the error

## 2020-11-07 NOTE — Telephone Encounter (Signed)
Gwen from [facility] called in about [place]. They need more clarification on instructions

## 2020-11-07 NOTE — Telephone Encounter (Signed)
[facility] called and spoke to [place], [facility] about clarification on trazodone 100mg . There are 2 different instructions for the medication. Will need clarification on how many tablets the pt is to be taking, 1 or 1.5 tablets.

## 2020-12-03 ENCOUNTER — Encounter: Payer: Self-pay | Admitting: Critical Care Medicine

## 2020-12-03 NOTE — Progress Notes (Signed)
Reviewed labs from [facility] pain clinic from 10/25/20  Vit D 52.5 nl CBC Normal RA factor < 7 Drug screen pos oxycodone/hydrocodone only ANA NEG CMET normal  Cr 0.9  K 3.7   LFT normal  Ca 10.3 ESR 49 slightly high

## 2020-12-10 IMAGING — MG MM DIGITAL DIAGNOSTIC UNILAT*R* W/ TOMO W/ CAD
6 series · 6 of 18 positions shown · non-contrast
Comparison: Baseline screening mammogram dated 05/02/2019.

CLINICAL DATA: Patient was called back from screening mammogram for
possible masses in the right breast.

EXAM:
DIGITAL DIAGNOSTIC RIGHT MAMMOGRAM WITH CAD AND TOMO
ULTRASOUND RIGHT BREAST

[R MLO synth-2D]
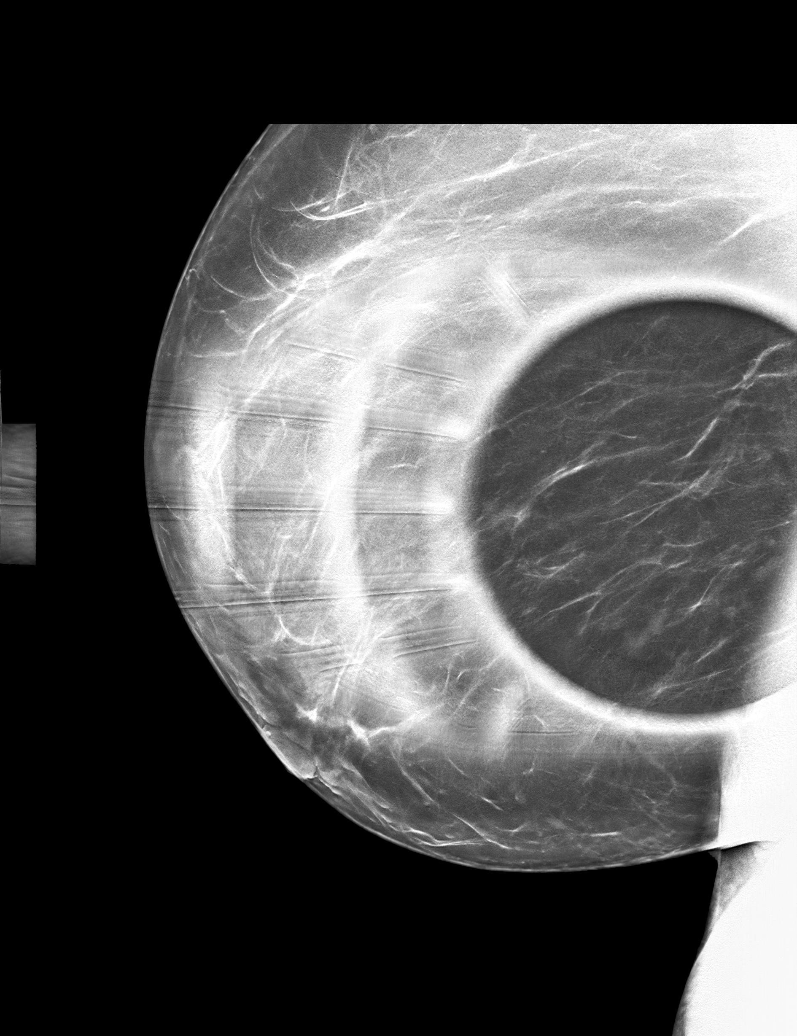

[R CC synth-2D]
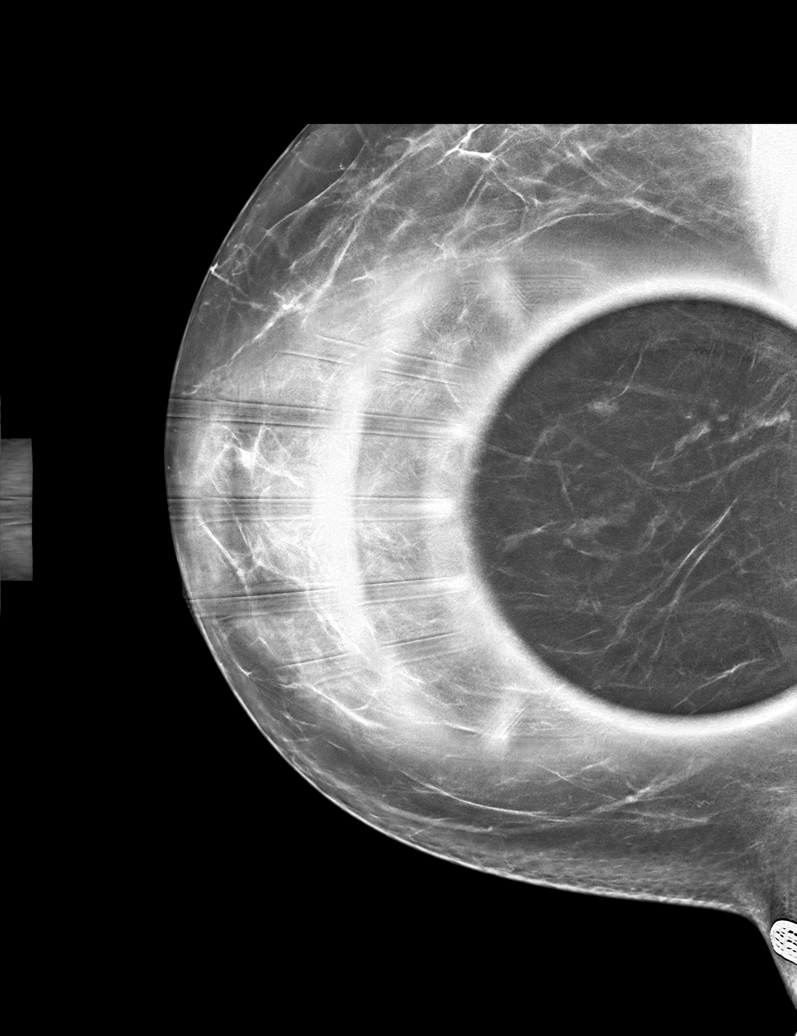

[R ML synth-2D]
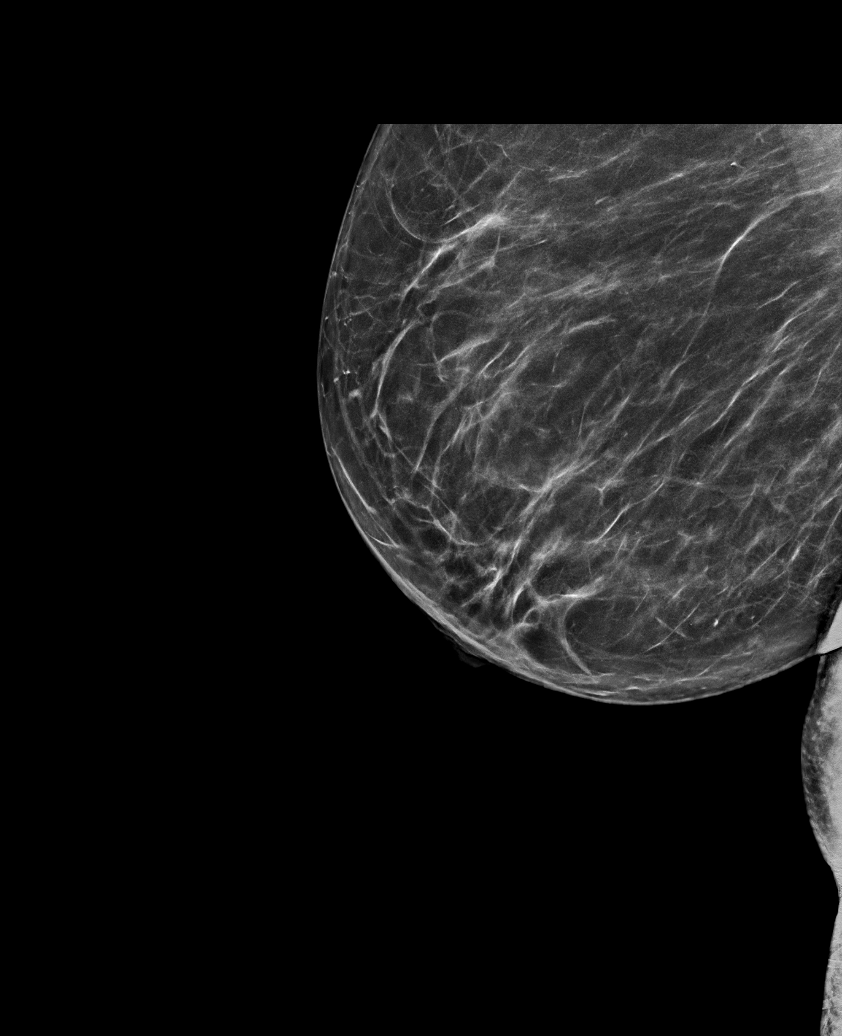

[R CC tomo · tomo slice 37/72.0]
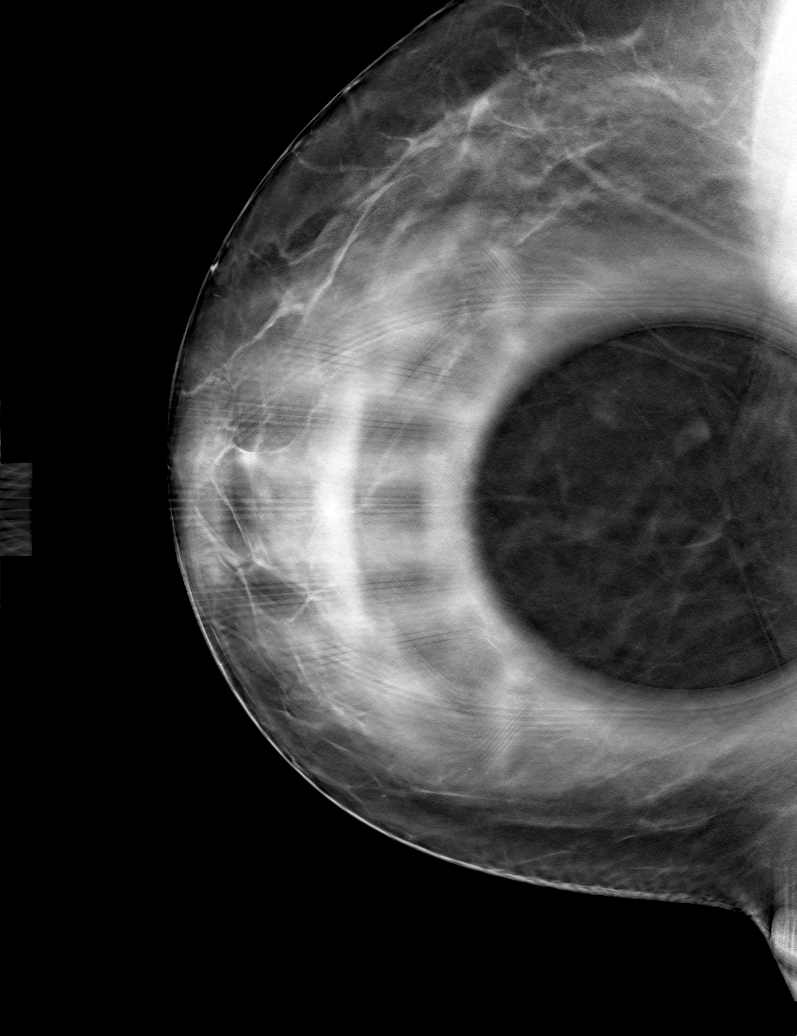

[R MLO tomo · tomo slice 35/68.0]
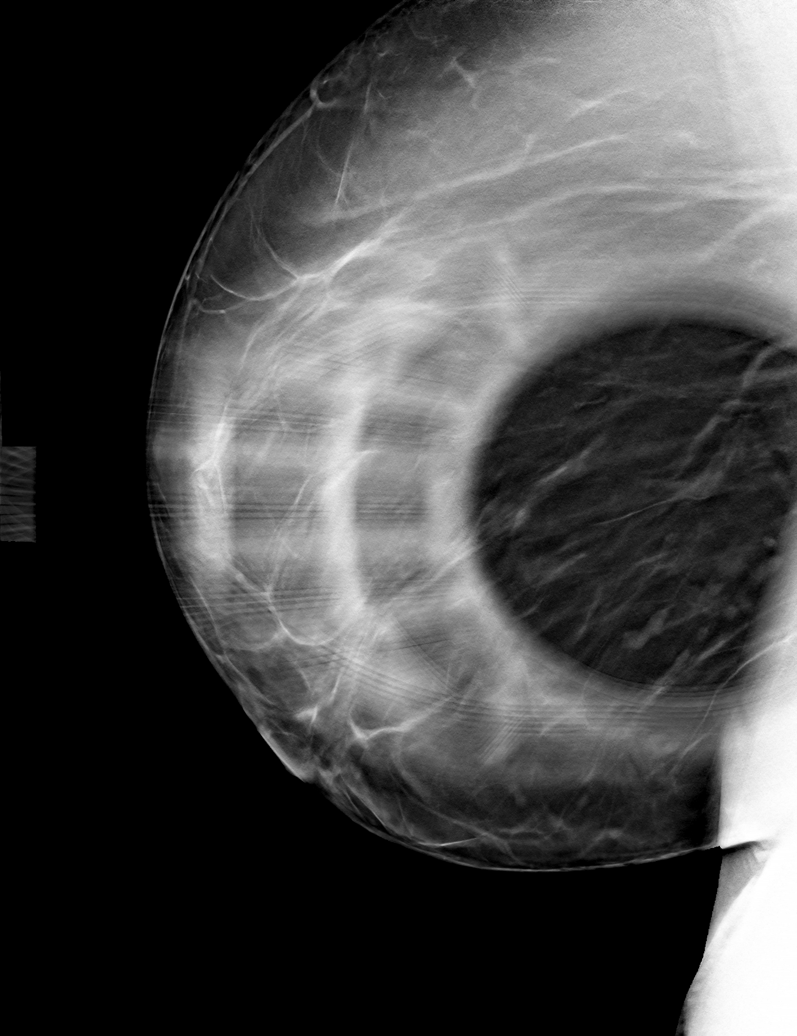

[R ML tomo · tomo slice 41/81.0]
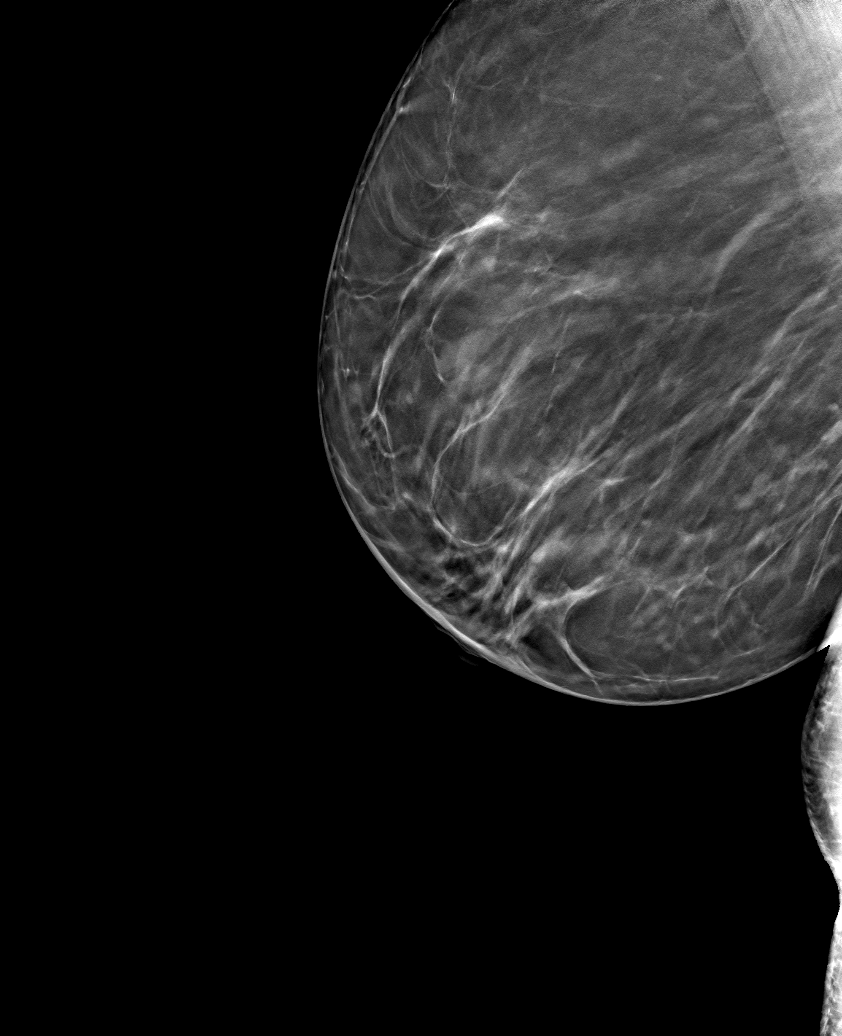

[6 of 18 positions shown; findings below may reference images not displayed]

ACR Breast Density Category b: There are scattered areas of
fibroglandular density.
FINDINGS: Additional imaging of the right breast was performed. There is
interval full improvement in the asymmetric fibroglandular tissue in
the retroareolar region. No suspicious mass or malignant type
microcalcifications identified.

Mammographic images were processed with CAD.

Targeted ultrasound is performed, showing no ductal dilatation in
the far posterior aspect of the retroareolar region. No suspicious
mass, distortion or abnormal shadowing detected.
IMPRESSION: Probable benign asymmetric fibroglandular tissue in the right
breast.

RECOMMENDATION:
Short-term interval follow-up right mammogram in 6 months is
recommended.

I have discussed the findings and recommendations with the patient.
If applicable, a reminder letter will be sent to the patient
regarding the next appointment.

BI-RADS CATEGORY  3: Probably benign.

## 2020-12-25 ENCOUNTER — Other Ambulatory Visit: Payer: Self-pay

## 2020-12-25 ENCOUNTER — Encounter: Payer: Self-pay | Admitting: Critical Care Medicine

## 2020-12-25 ENCOUNTER — Ambulatory Visit: Payer: Medicare Other | Attending: Critical Care Medicine | Admitting: Critical Care Medicine

## 2020-12-25 VITALS — BP 113/78 | HR 72 | Resp 16 | Wt 197.4 lb

## 2020-12-25 DIAGNOSIS — E1169 Type 2 diabetes mellitus with other specified complication: Secondary | ICD-10-CM | POA: Diagnosis not present

## 2020-12-25 DIAGNOSIS — M25562 Pain in left knee: Secondary | ICD-10-CM

## 2020-12-25 DIAGNOSIS — E1165 Type 2 diabetes mellitus with hyperglycemia: Secondary | ICD-10-CM

## 2020-12-25 DIAGNOSIS — I10 Essential (primary) hypertension: Secondary | ICD-10-CM

## 2020-12-25 DIAGNOSIS — E785 Hyperlipidemia, unspecified: Secondary | ICD-10-CM

## 2020-12-25 DIAGNOSIS — G8929 Other chronic pain: Secondary | ICD-10-CM

## 2020-12-25 LAB — POCT GLYCOSYLATED HEMOGLOBIN (HGB A1C): HbA1c, POC (controlled diabetic range): 6 % (ref 0.0–7.0)

## 2020-12-25 LAB — GLUCOSE, POCT (MANUAL RESULT ENTRY): POC Glucose: 133 mg/dl — AB (ref 70–99)

## 2020-12-25 MED ORDER — ATORVASTATIN CALCIUM 10 MG PO TABS: 10.0000 mg | ORAL_TABLET | Freq: Every day | ORAL | 1 refills | Status: DC

## 2020-12-25 MED ORDER — BENZTROPINE MESYLATE 0.5 MG PO TABS: 0.5000 mg | ORAL_TABLET | Freq: Every day | ORAL | 4 refills | Status: DC

## 2020-12-25 MED ORDER — OMEPRAZOLE 40 MG PO CPDR: 40.0000 mg | DELAYED_RELEASE_CAPSULE | Freq: Every day | ORAL | 3 refills | Status: DC

## 2020-12-25 MED ORDER — ARIPIPRAZOLE 15 MG PO TABS: 15.0000 mg | ORAL_TABLET | Freq: Every day | ORAL | 4 refills | Status: DC

## 2020-12-25 MED ORDER — ESCITALOPRAM OXALATE 5 MG PO TABS: 5.0000 mg | ORAL_TABLET | Freq: Every day | ORAL | 4 refills | Status: DC

## 2020-12-25 MED ORDER — LOSARTAN POTASSIUM-HCTZ 50-12.5 MG PO TABS: 1.0000 | ORAL_TABLET | Freq: Every day | ORAL | 3 refills | Status: DC

## 2020-12-25 MED ORDER — PRAZOSIN HCL 1 MG PO CAPS: 1.0000 mg | ORAL_CAPSULE | Freq: Every day | ORAL | 4 refills | Status: DC

## 2020-12-25 MED ORDER — TRAZODONE HCL 100 MG PO TABS: 150.0000 mg | ORAL_TABLET | Freq: Every evening | ORAL | 2 refills | Status: DC | PRN

## 2020-12-25 MED ORDER — CLONIDINE HCL 0.1 MG PO TABS: 0.1000 mg | ORAL_TABLET | Freq: Two times a day (BID) | ORAL | 4 refills | Status: DC

## 2020-12-25 MED ORDER — METFORMIN HCL 500 MG PO TABS: 500.0000 mg | ORAL_TABLET | Freq: Every day | ORAL | 3 refills | Status: DC

## 2020-12-25 MED ORDER — VITAMIN D (ERGOCALCIFEROL) 1.25 MG (50000 UNIT) PO CAPS: 50000.0000 [IU] | ORAL_CAPSULE | ORAL | 4 refills | Status: DC

## 2020-12-25 MED ORDER — BACLOFEN 10 MG PO TABS: 10.0000 mg | ORAL_TABLET | Freq: Three times a day (TID) | ORAL | 3 refills | Status: DC

## 2020-12-25 NOTE — Assessment & Plan Note (Signed)
Hypertension well controlled at this time Plan to continue losartan HCT daily along with clonidine point 1 mg twice daily

## 2020-12-25 NOTE — Progress Notes (Signed)
Established Patient Office Visit  Subjective:  Patient ID: Sydney Harris, female    DOB: 11-Nov-1965  Age: 55 y.o. MRN: [id number]  CC:  Chief Complaint  Patient presents with   Diabetes    HPI Sydney Harris presents for diabetes follow-up.  On arrival blood pressure 113/78 glucose 133.  Patient does have an eye appointment coming up in January.  Patient does see pain management and orthopedics for her chronic knee pain.  She is on periodic oxycodone.  Recent work-up laboratory wise is noted below from the pain clinic  We offered pneumonia vaccine she wishes to think about that  Vit D 52.5 nl CBC Normal RA factor < 7 Drug screen pos oxycodone/hydrocodone only ANA NEG CMET normal  Cr 0.9  K 3.7   LFT normal  Ca 10.3 ESR 49 slightly high The patient has been losing weight she is lost up to 20 pounds with dietary changes is improved her blood pressure control and diabetes significantly.  Her last A1c was 6.4 Past Medical History:  Diagnosis Date   Anxiety    Arthritis of left knee    Bipolar disorder ([place])    Blood transfusion 1981   "related to my daughter being born"   Depression    Diabetes mellitus without complication ([place])    Fibroid tumor    Gout    Hypertension    Plantar fasciitis    Substance abuse ([place])    crack cocaine  14 yrs sober.    Past Surgical History:  Procedure Laterality Date   ABDOMINAL HYSTERECTOMY     "for fibroid tumors"    Family History  Problem Relation Age of Onset   Diabetes Father    Asthma Daughter    Heart failure Neg Hx    Cancer Neg Hx    Colon polyps Neg Hx    Colon cancer Neg Hx    Esophageal cancer Neg Hx    Rectal cancer Neg Hx    Stomach cancer Neg Hx     Social History   Socioeconomic History   Marital status: Single    Spouse name: Not on file   Number of children: Not on file   Years of education: Not on file   Highest education level: Not on file  Occupational History   Not on file  Tobacco  Use   Smoking status: Former    Packs/day: 0.50    Years: 33.00    Pack years: 16.50    Types: Cigarettes    Quit date: 2018    Years since quitting: 4.9   Smokeless tobacco: Never  Vaping Use   Vaping Use: Never used  Substance and Sexual Activity   Alcohol use: No   Drug use: Yes    Types: "Crack" cocaine, Marijuana    Comment: 06/30/2013 "last used crack ~ 03/2013"   Sexual activity: Never  Other Topics Concern   Not on file  Social History Narrative   Not on file   Social Determinants of Health   Financial Resource Strain: Not on file  Food Insecurity: Not on file  Transportation Needs: Not on file  Physical Activity: Not on file  Stress: Not on file  Social Connections: Not on file  Intimate Partner Violence: Not on file    Outpatient Medications Prior to Visit  Medication Sig Dispense Refill   Accu-Chek Softclix Lancets lancets Use to measure blood sugar twice a day. E11.65 100 each 6   Blood Glucose Monitoring Suppl (  ACCU-CHEK GUIDE ME) w/Device KIT Use to measure blood sugar twice a day. E11.65 1 kit 0   glucose blood (ACCU-CHEK GUIDE) test strip Use to measure blood sugar twice a day. E11.65 100 each 6   Multiple Vitamin (MULTIVITAMIN ADULT PO) Take by mouth daily.     Multiple Vitamins-Minerals (HAIR SKIN AND NAILS FORMULA PO) Take by mouth daily.     naloxone (NARCAN) nasal spray 4 mg/0.1 mL SMARTSIG:Both Nares     oxyCODONE-acetaminophen (PERCOCET) 10-325 MG tablet Take 1 tablet by mouth 4 (four) times daily as needed.     ARIPiprazole (ABILIFY) 15 MG tablet Take 1 tablet (15 mg total) by mouth at bedtime. 30 tablet 4   atorvastatin (LIPITOR) 10 MG tablet Take 1 tablet (10 mg total) by mouth daily. 90 tablet 1   baclofen (LIORESAL) 10 MG tablet Take 1 tablet (10 mg total) by mouth 3 (three) times daily. 90 each 3   benztropine (COGENTIN) 0.5 MG tablet Take 1 tablet (0.5 mg total) by mouth at bedtime. 30 tablet 4   cloNIDine (CATAPRES) 0.1 MG tablet Take 1  tablet (0.1 mg total) by mouth 2 (two) times daily. 60 tablet 4   escitalopram (LEXAPRO) 5 MG tablet Take 1 tablet (5 mg total) by mouth daily. 30 tablet 4   losartan-hydrochlorothiazide (HYZAAR) 50-12.5 MG tablet Take 1 tablet by mouth daily. 90 tablet 3   metFORMIN (GLUCOPHAGE) 500 MG tablet Take 1 tablet (500 mg total) by mouth daily with breakfast. 60 tablet 3   omeprazole (PRILOSEC) 40 MG capsule Take 1 capsule (40 mg total) by mouth daily. 60 capsule 3   prazosin (MINIPRESS) 1 MG capsule Take 1 capsule (1 mg total) by mouth at bedtime. 30 capsule 4   traZODone (DESYREL) 100 MG tablet Take 1.5 tablets (150 mg total) by mouth at bedtime as needed. 45 tablet 2   Vitamin D, Ergocalciferol, (DRISDOL) 1.25 MG (50000 UNIT) CAPS capsule Take 1 capsule (50,000 Units total) by mouth once a week. 12 capsule 4   guaiFENesin (ROBITUSSIN) 100 MG/5ML SOLN Take 5 mLs (100 mg total) by mouth every 4 (four) hours as needed for cough or to loosen phlegm. (Patient not taking: Reported on 12/25/2020) 236 mL 0   No facility-administered medications prior to visit.    Allergies  Allergen Reactions   Penicillins Hives    Has patient had a PCN reaction causing immediate rash, facial/tongue/throat swelling, SOB or lightheadedness with hypotension: Yes Has patient had a PCN reaction causing severe rash involving mucus membranes or skin necrosis: Yes Has patient had a PCN reaction that required hospitalization No Has patient had a PCN reaction occurring within the last 10 years: Yes If all of the above answers are "NO", then may proceed with Cephalosporin use.    Tomato     hives    ROS Review of Systems  Constitutional: Negative.   HENT: Negative.  Negative for ear pain, postnasal drip, rhinorrhea, sinus pressure, sore throat, trouble swallowing and voice change.   Eyes: Negative.   Respiratory: Negative.  Negative for apnea, cough, choking, chest tightness, shortness of breath, wheezing and stridor.    Cardiovascular: Negative.  Negative for chest pain, palpitations and leg swelling.  Gastrointestinal: Negative.  Negative for abdominal distention, abdominal pain, nausea and vomiting.  Genitourinary: Negative.   Musculoskeletal: Negative.  Negative for arthralgias and myalgias.  Skin: Negative.  Negative for rash.  Allergic/Immunologic: Negative.  Negative for environmental allergies and food allergies.  Neurological: Negative.  Negative for  dizziness, syncope, weakness and headaches.  Hematological: Negative.  Negative for adenopathy. Does not bruise/bleed easily.  Psychiatric/Behavioral: Negative.  Negative for agitation and sleep disturbance. The patient is not nervous/anxious.      Objective:    Physical Exam Vitals reviewed.  Constitutional:      Appearance: Normal appearance. She is well-developed. She is obese. She is not diaphoretic.  HENT:     Head: Normocephalic and atraumatic.     Nose: No nasal deformity, septal deviation, mucosal edema or rhinorrhea.     Right Sinus: No maxillary sinus tenderness or frontal sinus tenderness.     Left Sinus: No maxillary sinus tenderness or frontal sinus tenderness.     Mouth/Throat:     Pharynx: No oropharyngeal exudate.  Eyes:     General: No scleral icterus.    Conjunctiva/sclera: Conjunctivae normal.     Pupils: Pupils are equal, round, and reactive to light.  Neck:     Thyroid: No thyromegaly.     Vascular: No carotid bruit or JVD.     Trachea: Trachea normal. No tracheal tenderness or tracheal deviation.  Cardiovascular:     Rate and Rhythm: Normal rate and regular rhythm.     Chest Wall: PMI is not displaced.     Pulses: Normal pulses. No decreased pulses.     Heart sounds: Normal heart sounds, S1 normal and S2 normal. Heart sounds not distant. No murmur heard. No systolic murmur is present.  No diastolic murmur is present.    No friction rub. No gallop. No S3 or S4 sounds.  Pulmonary:     Effort: No tachypnea, accessory  muscle usage or respiratory distress.     Breath sounds: No stridor. No decreased breath sounds, wheezing, rhonchi or rales.  Chest:     Chest wall: No tenderness.  Abdominal:     General: Bowel sounds are normal. There is no distension.     Palpations: Abdomen is soft. Abdomen is not rigid.     Tenderness: There is no abdominal tenderness. There is no guarding or rebound.  Musculoskeletal:        General: Normal range of motion.     Cervical back: Normal range of motion and neck supple. No edema, erythema or rigidity. No muscular tenderness. Normal range of motion.  Lymphadenopathy:     Head:     Right side of head: No submental or submandibular adenopathy.     Left side of head: No submental or submandibular adenopathy.     Cervical: No cervical adenopathy.  Skin:    General: Skin is warm and dry.     Coloration: Skin is not pale.     Findings: No rash.     Nails: There is no clubbing.  Neurological:     Mental Status: She is alert and oriented to person, place, and time.     Sensory: No sensory deficit.  Psychiatric:        Speech: Speech normal.        Behavior: Behavior normal.    BP 113/78   Pulse 72   Resp 16   Wt 197 lb 6.4 oz (89.5 kg)   LMP 12/28/2010   SpO2 98%   BMI 34.97 kg/m  Wt Readings from Last 3 Encounters:  12/25/20 197 lb 6.4 oz (89.5 kg)  07/30/20 210 lb (95.3 kg)  04/16/20 213 lb (96.6 kg)     Health Maintenance Due  Topic Date Due   Pneumococcal Vaccine 33-26 Years old (1 -  PCV) Never done   OPHTHALMOLOGY EXAM  Never done   COVID-19 Vaccine (3 - Booster for Moderna series) 08/27/2019    There are no preventive care reminders to display for this patient.  No results found for: TSH Lab Results  Component Value Date   WBC 6.6 12/21/2019   HGB 14.5 12/21/2019   HCT 43.2 12/21/2019   MCV 87 12/21/2019   PLT 349 12/21/2019   Lab Results  Component Value Date   NA 140 12/21/2019   K 4.2 12/21/2019   CO2 24 12/21/2019   GLUCOSE 97  12/21/2019   BUN 21 12/21/2019   CREATININE 0.95 12/21/2019   BILITOT 0.3 12/21/2019   ALKPHOS 64 12/21/2019   AST 16 12/21/2019   ALT 19 12/21/2019   PROT 7.6 12/21/2019   ALBUMIN 4.9 12/21/2019   CALCIUM 10.8 (H) 12/21/2019   ANIONGAP 10 02/08/2016   Lab Results  Component Value Date   CHOL 261 (H) 12/21/2019   Lab Results  Component Value Date   HDL 65 12/21/2019   Lab Results  Component Value Date   LDLCALC 174 (H) 12/21/2019   Lab Results  Component Value Date   TRIG 122 12/21/2019   Lab Results  Component Value Date   CHOLHDL 4.0 12/21/2019   Lab Results  Component Value Date   HGBA1C 6.0 12/25/2020      Assessment & Plan:   Problem List Items Addressed This Visit       Cardiovascular and Mediastinum   HTN (hypertension)    Hypertension well controlled at this time Plan to continue losartan HCT daily along with clonidine point 1 mg twice daily      Relevant Medications   atorvastatin (LIPITOR) 10 MG tablet   cloNIDine (CATAPRES) 0.1 MG tablet   losartan-hydrochlorothiazide (HYZAAR) 50-12.5 MG tablet   prazosin (MINIPRESS) 1 MG capsule     Endocrine   Type 2 diabetes mellitus with hyperglycemia, without long-term current use of insulin (HCC) - Primary    Improved control with dietary compliance continue metformin 500 mg daily along with dietary compliance      Relevant Medications   atorvastatin (LIPITOR) 10 MG tablet   losartan-hydrochlorothiazide (HYZAAR) 50-12.5 MG tablet   metFORMIN (GLUCOPHAGE) 500 MG tablet   Other Relevant Orders   POCT glucose (manual entry) (Completed)   POCT glycosylated hemoglobin (Hb A1C) (Completed)   Hyperlipidemia associated with type 2 diabetes mellitus (HCC)    Continue with low-dose Lipitor      Relevant Medications   atorvastatin (LIPITOR) 10 MG tablet   cloNIDine (CATAPRES) 0.1 MG tablet   losartan-hydrochlorothiazide (HYZAAR) 50-12.5 MG tablet   metFORMIN (GLUCOPHAGE) 500 MG tablet   prazosin  (MINIPRESS) 1 MG capsule     Other   Chronic pain of left knee    Advise continue follow-up with orthopedics and pain management      Relevant Medications   baclofen (LIORESAL) 10 MG tablet   escitalopram (LEXAPRO) 5 MG tablet   traZODone (DESYREL) 100 MG tablet    Meds ordered this encounter  Medications   ARIPiprazole (ABILIFY) 15 MG tablet    Sig: Take 1 tablet (15 mg total) by mouth at bedtime.    Dispense:  30 tablet    Refill:  4   atorvastatin (LIPITOR) 10 MG tablet    Sig: Take 1 tablet (10 mg total) by mouth daily.    Dispense:  90 tablet    Refill:  1   baclofen (LIORESAL) 10 MG tablet  Sig: Take 1 tablet (10 mg total) by mouth 3 (three) times daily.    Dispense:  90 each    Refill:  3   benztropine (COGENTIN) 0.5 MG tablet    Sig: Take 1 tablet (0.5 mg total) by mouth at bedtime.    Dispense:  30 tablet    Refill:  4   cloNIDine (CATAPRES) 0.1 MG tablet    Sig: Take 1 tablet (0.1 mg total) by mouth 2 (two) times daily.    Dispense:  60 tablet    Refill:  4   escitalopram (LEXAPRO) 5 MG tablet    Sig: Take 1 tablet (5 mg total) by mouth daily.    Dispense:  30 tablet    Refill:  4   losartan-hydrochlorothiazide (HYZAAR) 50-12.5 MG tablet    Sig: Take 1 tablet by mouth daily.    Dispense:  90 tablet    Refill:  3   metFORMIN (GLUCOPHAGE) 500 MG tablet    Sig: Take 1 tablet (500 mg total) by mouth daily with breakfast.    Dispense:  60 tablet    Refill:  3   omeprazole (PRILOSEC) 40 MG capsule    Sig: Take 1 capsule (40 mg total) by mouth daily.    Dispense:  60 capsule    Refill:  3   prazosin (MINIPRESS) 1 MG capsule    Sig: Take 1 capsule (1 mg total) by mouth at bedtime.    Dispense:  30 capsule    Refill:  4   traZODone (DESYREL) 100 MG tablet    Sig: Take 1.5 tablets (150 mg total) by mouth at bedtime as needed.    Dispense:  45 tablet    Refill:  2   Vitamin D, Ergocalciferol, (DRISDOL) 1.25 MG (50000 UNIT) CAPS capsule    Sig: Take 1  capsule (50,000 Units total) by mouth once a week.    Dispense:  12 capsule    Refill:  4    Follow-up: Return in about 4 months (around 04/25/2021).    Asencion Noble, MD

## 2020-12-25 NOTE — Assessment & Plan Note (Addendum)
Improved control with dietary compliance continue metformin 500 mg daily along with dietary compliance  A1c down to 6.0

## 2020-12-25 NOTE — Patient Instructions (Signed)
No change in medications  Refills sent to your [facility] pharmacy   Continue health diet  We discussed pneumonia vaccine, you will consider  Return Dr Sydney Harris 4 months

## 2020-12-25 NOTE — Assessment & Plan Note (Signed)
Continue with low-dose Lipitor

## 2020-12-25 NOTE — Assessment & Plan Note (Signed)
Advise continue follow-up with orthopedics and pain management

## 2020-12-26 ENCOUNTER — Telehealth: Payer: Self-pay

## 2020-12-26 NOTE — Telephone Encounter (Signed)
Pt was called and is aware of results, DOB was confirmed.  ?

## 2020-12-26 NOTE — Telephone Encounter (Signed)
-----   Message from Elsie Stain, MD sent at 12/25/2020  1:25 PM EST ----- Call ms Heeney and tell her the A1C was 6.0  best it has ever been  praise her

## 2021-02-01 ENCOUNTER — Encounter: Payer: Self-pay | Admitting: Critical Care Medicine

## 2021-02-01 LAB — HM DIABETES EYE EXAM

## 2021-02-05 ENCOUNTER — Telehealth: Payer: Self-pay

## 2021-02-05 NOTE — Telephone Encounter (Signed)
Contacted pt to schedule Medicare Wellness pt didn't answer lvm   °

## 2021-04-15 ENCOUNTER — Encounter (HOSPITAL_COMMUNITY): Payer: Self-pay

## 2021-04-15 ENCOUNTER — Ambulatory Visit (HOSPITAL_COMMUNITY)
Admission: EM | Admit: 2021-04-15 | Discharge: 2021-04-15 | Disposition: A | Discharge status: Home / Self Care | Payer: Medicare Other | Attending: Internal Medicine | Admitting: Internal Medicine

## 2021-04-15 DIAGNOSIS — B0229 Other postherpetic nervous system involvement: Secondary | ICD-10-CM

## 2021-04-15 MED ORDER — VALACYCLOVIR HCL 1 G PO TABS: 1000.0000 mg | ORAL_TABLET | Freq: Three times a day (TID) | ORAL | 0 refills | Status: AC

## 2021-04-15 MED ORDER — GABAPENTIN 100 MG PO CAPS: 100.0000 mg | ORAL_CAPSULE | Freq: Three times a day (TID) | ORAL | 0 refills | Status: DC

## 2021-04-15 MED ORDER — LIDOCAINE 5 % EX OINT: 1.0000 "application " | TOPICAL_OINTMENT | CUTANEOUS | 0 refills | Status: DC | PRN

## 2021-04-15 NOTE — ED Provider Notes (Addendum)
[facility]    CSN: [id number] Arrival date & time: 04/15/21  4098      History   Chief Complaint Chief Complaint  Patient presents with   Breast Pain    HPI Sydney Harris is a 56 y.o. female to the urgent care with 1 week history of burning pain which starts from the mid back and wraps around the right side of the chest to the right breast.  Patient says symptoms started 1 week ago and has been persistent.  Pain is aggravated by touching the skin and is associated with a rash that starts from the back around the right side of the chest and also in the breast area.  No trauma to the back or chest.  No history of shingles.  No discharge from the rash.  Patient denies any fever or chills.  Mammogram in 2021 is unremarkable.   HPI  Past Medical History:  Diagnosis Date   Anxiety    Arthritis of left knee    Bipolar disorder ([facility])    Blood transfusion 1981   "related to my daughter being born"   Depression    Diabetes mellitus without complication (HCC)    Fibroid tumor    Gout    Hypertension    Plantar fasciitis    Substance abuse ([facility])    crack cocaine  14 yrs sober.    Patient Active Problem List   Diagnosis Date Noted   HTN (hypertension) 03/28/2020   Chronic bilateral low back pain without sciatica 12/28/2019   Hyperlipidemia associated with type 2 diabetes mellitus (HCC) 12/22/2019   Chronic pain of left knee 12/21/2019   Type 2 diabetes mellitus with hyperglycemia, without long-term current use of insulin (HCC) 12/21/2019    Past Surgical History:  Procedure Laterality Date   ABDOMINAL HYSTERECTOMY     "for fibroid tumors"    OB History   No obstetric history on file.      Home Medications    Prior to Admission medications   Medication Sig Start Date End Date Taking? Authorizing Provider  gabapentin (NEURONTIN) 100 MG capsule Take 1 capsule (100 mg total) by mouth 3 (three) times daily for 10 days. 04/15/21 04/25/21 Yes Lauriann Milillo,  Britta Mccreedy, MD  lidocaine (XYLOCAINE) 5 % ointment Apply 1 application. topically as needed. 04/15/21  Yes Tejon Gracie, Britta Mccreedy, MD  valACYclovir (VALTREX) 1000 MG tablet Take 1 tablet (1,000 mg total) by mouth 3 (three) times daily for 7 days. 04/15/21 04/22/21 Yes Nandita Mathenia, Britta Mccreedy, MD  Accu-Chek Softclix Lancets lancets Use to measure blood sugar twice a day. E11.65 12/26/19   Storm Frisk, MD  ARIPiprazole (ABILIFY) 15 MG tablet Take 1 tablet (15 mg total) by mouth at bedtime. 12/25/20   Storm Frisk, MD  atorvastatin (LIPITOR) 10 MG tablet Take 1 tablet (10 mg total) by mouth daily. 12/25/20   Storm Frisk, MD  baclofen (LIORESAL) 10 MG tablet Take 1 tablet (10 mg total) by mouth 3 (three) times daily. 12/25/20   Storm Frisk, MD  benztropine (COGENTIN) 0.5 MG tablet Take 1 tablet (0.5 mg total) by mouth at bedtime. 12/25/20   Storm Frisk, MD  Blood Glucose Monitoring Suppl (ACCU-CHEK GUIDE ME) w/Device KIT Use to measure blood sugar twice a day. E11.65 12/26/19   Storm Frisk, MD  cloNIDine (CATAPRES) 0.1 MG tablet Take 1 tablet (0.1 mg total) by mouth 2 (two) times daily. 12/25/20   Storm Frisk, MD  escitalopram (  LEXAPRO) 5 MG tablet Take 1 tablet (5 mg total) by mouth daily. 12/25/20   Storm Frisk, MD  glucose blood (ACCU-CHEK GUIDE) test strip Use to measure blood sugar twice a day. E11.65 12/26/19   Storm Frisk, MD  guaiFENesin (ROBITUSSIN) 100 MG/5ML SOLN Take 5 mLs (100 mg total) by mouth every 4 (four) hours as needed for cough or to loosen phlegm. Patient not taking: Reported on 12/25/2020 01/24/20   Sandre Kitty, MD  losartan-hydrochlorothiazide [facility]) 50-12.5 MG tablet Take 1 tablet by mouth daily. 12/25/20   Storm Frisk, MD  metFORMIN (GLUCOPHAGE) 500 MG tablet Take 1 tablet (500 mg total) by mouth daily with breakfast. 12/25/20   Storm Frisk, MD  Multiple Vitamin (MULTIVITAMIN ADULT PO) Take by mouth daily.    [provider]   Multiple Vitamins-Minerals (HAIR SKIN AND NAILS FORMULA PO) Take by mouth daily.    [provider]  naloxone [facility]) nasal spray 4 mg/0.1 mL SMARTSIG:Both Nares 09/21/20   [provider]  omeprazole (PRILOSEC) 40 MG capsule Take 1 capsule (40 mg total) by mouth daily. 12/25/20   Storm Frisk, MD  oxyCODONE-acetaminophen (PERCOCET) 10-325 MG tablet Take 1 tablet by mouth 4 (four) times daily as needed. 10/24/20   [provider]  prazosin (MINIPRESS) 1 MG capsule Take 1 capsule (1 mg total) by mouth at bedtime. 12/25/20   Storm Frisk, MD  traZODone (DESYREL) 100 MG tablet Take 1.5 tablets (150 mg total) by mouth at bedtime as needed. 12/25/20   Storm Frisk, MD  Vitamin D, Ergocalciferol, (DRISDOL) 1.25 MG (50000 UNIT) CAPS capsule Take 1 capsule (50,000 Units total) by mouth once a week. 12/25/20   Storm Frisk, MD    Family History Family History  Problem Relation Age of Onset   Diabetes Father    Asthma Daughter    Heart failure Neg Hx    Cancer Neg Hx    Colon polyps Neg Hx    Colon cancer Neg Hx    Esophageal cancer Neg Hx    Rectal cancer Neg Hx    Stomach cancer Neg Hx     Social History Social History   Tobacco Use   Smoking status: Former    Packs/day: 0.50    Years: 33.00    Pack years: 16.50    Types: Cigarettes    Quit date: 2018    Years since quitting: 5.2   Smokeless tobacco: Never  Vaping Use   Vaping Use: Never used  Substance Use Topics   Alcohol use: No   Drug use: Yes    Types: "Crack" cocaine, Marijuana    Comment: 06/30/2013 "last used crack ~ 03/2013"     Allergies   Penicillins and Tomato   Review of Systems Review of Systems  HENT: Negative.    Respiratory: Negative.    Genitourinary: Negative.   Musculoskeletal: Negative.   Skin:  Positive for rash. Negative for color change and wound.  Neurological: Negative.     Physical Exam Triage Vital Signs ED Triage Vitals  Enc Vitals Group      BP 04/15/21 0949 133/73     Pulse Rate 04/15/21 0949 70     Resp 04/15/21 0949 14     Temp 04/15/21 0949 98 F (36.7 C)     Temp Source 04/15/21 0949 Oral     SpO2 04/15/21 0949 100 %     Weight --      Height --  Head Circumference --      Peak Flow --      Pain Score 04/15/21 0953 9     Pain Loc --      Pain Edu? --      Excl. in GC? --    No data found.  Updated Vital Signs BP 133/73 (BP Location: Right Arm)   Pulse 70   Temp 98 F (36.7 C) (Oral)   Resp 14   LMP 12/28/2010   SpO2 100%   Visual Acuity Right Eye Distance:   Left Eye Distance:   Bilateral Distance:    Right Eye Near:   Left Eye Near:    Bilateral Near:     Physical Exam Vitals and nursing note reviewed. Exam conducted with a chaperone present.  Constitutional:      General: She is not in acute distress.    Appearance: She is not ill-appearing.  Cardiovascular:     Rate and Rhythm: Normal rate and regular rhythm.  Musculoskeletal:        General: Normal range of motion.  Skin:    General: Skin is warm.     Findings: Rash present.     Comments: Papular and vesicular rash with dermatomal distribution on the right side of the chest and in the breast area.  Mild erythema surrounding the rash.  Few vesicles and new clusters of rash appears to be erupting.  Neurological:     Mental Status: She is alert.     UC Treatments / Results  Labs (all labs ordered are listed, but only abnormal results are displayed) Labs Reviewed - No data to display  EKG   Radiology No results found.  Procedures Procedures (including critical care time)  Medications Ordered in UC Medications - No data to display  Initial Impression / Assessment and Plan / UC Course  I have reviewed the triage vital signs and the nursing notes.  Pertinent labs & imaging results that were available during my care of the patient were reviewed by me and considered in my medical decision making (see chart for details).      1.  Shingles: Gabapentin 100 mg 3 times daily Valacyclovir 100 mg 3 times daily for 7 days-patient endorses new rash over the past 24 to 48 hours Continue ibuprofen over-the-counter Lidocaine gel Previous medical records related to mammogram was reviewed during this visit. Return to urgent care if symptoms worsen. Final Clinical Impressions(s) / UC Diagnoses   Final diagnoses:  Post herpetic neuralgia     Discharge Instructions      Take medications as prescribed You have shingles If you notice redness, worsening rash/new rash or worsening.  Please return to urgent care to be reevaluated.    ED Prescriptions     Medication Sig Dispense Auth. Provider   gabapentin (NEURONTIN) 100 MG capsule Take 1 capsule (100 mg total) by mouth 3 (three) times daily for 10 days. 30 capsule Arriah Wadle, Britta Mccreedy, MD   lidocaine (XYLOCAINE) 5 % ointment Apply 1 application. topically as needed. 35.44 g Merrilee Jansky, MD   valACYclovir (VALTREX) 1000 MG tablet Take 1 tablet (1,000 mg total) by mouth 3 (three) times daily for 7 days. 21 tablet Gid Schoffstall, Britta Mccreedy, MD      PDMP not reviewed this encounter.   Merrilee Jansky, MD 04/15/21 1044    Merrilee Jansky, MD 04/15/21 1045

## 2021-04-15 NOTE — Discharge Instructions (Addendum)
Take medications as prescribed ?You have shingles ?If you notice redness, worsening rash/new rash or worsening.  Please return to urgent care to be reevaluated. ? ?

## 2021-04-15 NOTE — ED Triage Notes (Signed)
Pt presents with rt breast pain, redness, and swelling x 1 week ?

## 2021-06-07 ENCOUNTER — Ambulatory Visit (INDEPENDENT_AMBULATORY_CARE_PROVIDER_SITE_OTHER): Payer: Medicare Other

## 2021-06-07 DIAGNOSIS — Z0100 Encounter for examination of eyes and vision without abnormal findings: Secondary | ICD-10-CM | POA: Diagnosis not present

## 2021-06-07 DIAGNOSIS — Z122 Encounter for screening for malignant neoplasm of respiratory organs: Secondary | ICD-10-CM | POA: Diagnosis not present

## 2021-06-07 DIAGNOSIS — Z0000 Encounter for general adult medical examination without abnormal findings: Secondary | ICD-10-CM | POA: Diagnosis not present

## 2021-06-07 DIAGNOSIS — Z1231 Encounter for screening mammogram for malignant neoplasm of breast: Secondary | ICD-10-CM | POA: Diagnosis not present

## 2021-06-07 NOTE — Progress Notes (Addendum)
Subjective:   Sydney Harris is a 56 y.o. female who presents for Medicare Annual (Subsequent) preventive examination. I discussed the limitations of evaluation and management by telemedicine and the availability of in person appointments. The patient expressed understanding and agreed to proceed.   Visit performed by audio   Patient location: Home  Provider location: Home  Review of Systems    N/A       Objective:    Today's Vitals   06/07/21 1038  PainSc: 8    There is no height or weight on file to calculate BMI.     08/17/2020    9:28 AM 01/27/2016    2:44 AM 06/30/2013   10:19 PM  Advanced Directives  Does Patient Have a Medical Advance Directive? No No Patient does not have advance directive;Patient would like information  Would patient like information on creating a medical advance directive? No - Patient declined  Advance directive packet given  Pre-existing out of facility DNR order (yellow form or pink MOST form)   No    Current Medications (verified) Outpatient Encounter Medications as of 06/07/2021  Medication Sig   Accu-Chek Softclix Lancets lancets Use to measure blood sugar twice a day. E11.65   ARIPiprazole (ABILIFY) 15 MG tablet Take 1 tablet (15 mg total) by mouth at bedtime.   atorvastatin (LIPITOR) 10 MG tablet Take 1 tablet (10 mg total) by mouth daily.   baclofen (LIORESAL) 10 MG tablet Take 1 tablet (10 mg total) by mouth 3 (three) times daily.   benztropine (COGENTIN) 0.5 MG tablet Take 1 tablet (0.5 mg total) by mouth at bedtime.   Blood Glucose Monitoring Suppl (ACCU-CHEK GUIDE ME) w/Device KIT Use to measure blood sugar twice a day. E11.65   cloNIDine (CATAPRES) 0.1 MG tablet Take 1 tablet (0.1 mg total) by mouth 2 (two) times daily.   escitalopram (LEXAPRO) 5 MG tablet Take 1 tablet (5 mg total) by mouth daily.   glucose blood (ACCU-CHEK GUIDE) test strip Use to measure blood sugar twice a day. E11.65   lidocaine (XYLOCAINE) 5 %  ointment Apply 1 application. topically as needed.   losartan-hydrochlorothiazide (HYZAAR) 50-12.5 MG tablet Take 1 tablet by mouth daily.   metFORMIN (GLUCOPHAGE) 500 MG tablet Take 1 tablet (500 mg total) by mouth daily with breakfast.   Multiple Vitamin (MULTIVITAMIN ADULT PO) Take by mouth daily.   Multiple Vitamins-Minerals (HAIR SKIN AND NAILS FORMULA PO) Take by mouth daily.   naloxone (NARCAN) nasal spray 4 mg/0.1 mL SMARTSIG:Both Nares   omeprazole (PRILOSEC) 40 MG capsule Take 1 capsule (40 mg total) by mouth daily.   oxyCODONE-acetaminophen (PERCOCET) 10-325 MG tablet Take 1 tablet by mouth 4 (four) times daily as needed.   prazosin (MINIPRESS) 1 MG capsule Take 1 capsule (1 mg total) by mouth at bedtime.   traZODone (DESYREL) 100 MG tablet Take 1.5 tablets (150 mg total) by mouth at bedtime as needed.   Vitamin D, Ergocalciferol, (DRISDOL) 1.25 MG (50000 UNIT) CAPS capsule Take 1 capsule (50,000 Units total) by mouth once a week.   gabapentin (NEURONTIN) 100 MG capsule Take 1 capsule (100 mg total) by mouth 3 (three) times daily for 10 days.   [DISCONTINUED] guaiFENesin (ROBITUSSIN) 100 MG/5ML SOLN Take 5 mLs (100 mg total) by mouth every 4 (four) hours as needed for cough or to loosen phlegm. (Patient not taking: Reported on 12/25/2020)   No facility-administered encounter medications on file as of 06/07/2021.    Allergies (verified) Penicillins and Tomato  History: Past Medical History:  Diagnosis Date   Anxiety    Arthritis of left knee    Bipolar disorder ([place])    Blood transfusion 1981   "related to my daughter being born"   Depression    Diabetes mellitus without complication ([place])    Fibroid tumor    Gout    Hypertension    Plantar fasciitis    Substance abuse ([place])    crack cocaine  14 yrs sober.   Past Surgical History:  Procedure Laterality Date   ABDOMINAL HYSTERECTOMY     "for fibroid tumors"   Family History  Problem Relation Age of Onset   Diabetes  Father    Asthma Daughter    Heart failure Neg Hx    Cancer Neg Hx    Colon polyps Neg Hx    Colon cancer Neg Hx    Esophageal cancer Neg Hx    Rectal cancer Neg Hx    Stomach cancer Neg Hx    Social History   Socioeconomic History   Marital status: Single    Spouse name: Not on file   Number of children: Not on file   Years of education: Not on file   Highest education level: 9th grade  Occupational History   Not on file  Tobacco Use   Smoking status: Former    Packs/day: 0.50    Years: 33.00    Pack years: 16.50    Types: Cigarettes    Quit date: 2018    Years since quitting: 5.3   Smokeless tobacco: Never  Vaping Use   Vaping Use: Never used  Substance and Sexual Activity   Alcohol use: No   Drug use: Not Currently    Types: "Crack" cocaine, Marijuana    Comment: 06/30/2013 "last used crack ~ 03/2013"   Sexual activity: Yes  Other Topics Concern   Not on file  Social History Narrative   Not on file   Social Determinants of Health   Financial Resource Strain: Low Risk    Difficulty of Paying Living Expenses: Not hard at all  Food Insecurity: No Food Insecurity   Worried About Charity fundraiser in the Last Year: Never true   Ran Out of Food in the Last Year: Never true  Transportation Needs: No Transportation Needs   Lack of Transportation (Medical): No   Lack of Transportation (Non-Medical): No  Physical Activity: Sufficiently Active   Days of Exercise per Week: 3 days   Minutes of Exercise per Session: 60 min  Stress: No Stress Concern Present   Feeling of Stress : Not at all  Social Connections: Moderately Isolated   Frequency of Communication with Friends and Family: Twice a week   Frequency of Social Gatherings with Friends and Family: More than three times a week   Attends Religious Services: More than 4 times per year   Active Member of [employer] or Organizations: No   Attends Music therapist: Never   Marital Status: Never married     Tobacco Counseling Counseling given: Not Answered   Clinical Intake:  Pre-visit preparation completed: Yes  Pain : 0-10 Pain Score: 8  Pain Type: Chronic pain Pain Location: Leg Pain Orientation: Right, Left Pain Radiating Towards: towards hip Pain Descriptors / Indicators: Throbbing Pain Onset: More than a month ago Pain Frequency: Intermittent Pain Relieving Factors: Pain Medication  Pain Relieving Factors: Pain Medication  Diabetes: Yes  How often do you need to have someone help you when  you read instructions, pamphlets, or other written materials from your doctor or pharmacy?: 3 - Sometimes What is the last grade level you completed in school?: 9th grade  Diabetic? Yes  Interpreter Needed?: No  Information entered by :: Anson [place] CMA   Activities of Daily Living    06/07/2021   11:04 AM  In your present state of health, do you have any difficulty performing the following activities:  Hearing? 1  Vision? 0  Difficulty concentrating or making decisions? 1  Comment Pt takes medictaion for this  Walking or climbing stairs? 1  Dressing or bathing? 0  Doing errands, shopping? 0  Preparing Food and eating ? N  Using the Toilet? N  In the past six months, have you accidently leaked urine? Y  Do you have problems with loss of bowel control? N  Managing your Medications? Y  Managing your Finances? N  Housekeeping or managing your Housekeeping? N    Patient Care Team: Sydney Stain, MD as PCP - General (Pulmonary Disease)  Indicate any recent Medical Services you may have received from other than [facility] providers in the past year (date may be approximate).     Assessment:   This is a routine wellness examination for Sydney Harris.  Hearing/Vision screen No results found.  Dietary issues and exercise activities discussed:     Goals Addressed   None    Depression Screen    06/07/2021   10:48 AM 07/30/2020   10:40 AM 03/28/2020   10:57 AM  12/21/2019   10:33 AM 11/05/2019    9:11 AM  PHQ 2/9 Scores  PHQ - 2 Score _0 PHQ- 9 Score  _1 Fall Risk    06/07/2021    4:55 PM 12/25/2020   10:31 AM 03/28/2020   10:56 AM 12/21/2019   10:33 AM 11/05/2019    9:11 AM  Fall Risk   Falls in the past year? 0 0 0 0 1  Number falls in past yr: 0 0 0 0 0  Injury with Fall? 0 0 0 0 0  Risk for fall due to : No Fall Risks No Fall Risks No Fall Risks No Fall Risks   Follow up Falls evaluation completed  Falls evaluation completed      FALL RISK PREVENTION PERTAINING TO THE HOME:  Any stairs in or around the home? No  If so, are there any without handrails? No  Home free of loose throw rugs in walkways, pet beds, electrical cords, etc? Yes  Adequate lighting in your home to reduce risk of falls? Yes   ASSISTIVE DEVICES UTILIZED TO PREVENT FALLS:  Life alert? No  Use of a cane, walker or w/c? No  Grab bars in the bathroom? No  Shower chair or bench in shower? No  Elevated toilet seat or a handicapped toilet? No   TIMED UP AND GO:  Was the test performed? No .  Length of time to ambulate 10 feet: 0 sec.     Cognitive Function:        06/07/2021   11:05 AM  6CIT Screen  What Year? 0 points  What month? 0 points  What time? 0 points  Count back from 20 0 points  Months in reverse 0 points  Repeat phrase 0 points  Total Score 0 points    Immunizations Immunization History  Administered Date(s) Administered   Moderna Sars-Covid-2 Vaccination 06/04/2019, 07/02/2019    TDAP  status: Due, Education has been provided regarding the importance of this vaccine. Advised may receive this vaccine at local pharmacy or Health Dept. Aware to provide a copy of the vaccination record if obtained from local pharmacy or Health Dept. Verbalized acceptance and understanding.  Flu Vaccine status: Declined, Education has been provided regarding the importance of this vaccine but patient still declined. Advised may receive  this vaccine at local pharmacy or Health Dept. Aware to provide a copy of the vaccination record if obtained from local pharmacy or Health Dept. Verbalized acceptance and understanding.  Pneumococcal vaccine status: Declined,  Education has been provided regarding the importance of this vaccine but patient still declined. Advised may receive this vaccine at local pharmacy or Health Dept. Aware to provide a copy of the vaccination record if obtained from local pharmacy or Health Dept. Verbalized acceptance and understanding.   Covid-19 vaccine status: Information provided on how to obtain vaccines.   Qualifies for Shingles Vaccine? Yes   Zostavax completed No   Shingrix Completed?: No.    Education has been provided regarding the importance of this vaccine. Patient has been advised to call insurance company to determine out of pocket expense if they have not yet received this vaccine. Advised may also receive vaccine at local pharmacy or Health Dept. Verbalized acceptance and understanding.  Screening Tests Health Maintenance  Topic Date Due   TETANUS/TDAP  Never done   Zoster Vaccines- Shingrix (1 of 2) Never done   COVID-19 Vaccine (3 - Booster for Moderna series) 08/27/2019   FOOT EXAM  03/28/2021   MAMMOGRAM  05/01/2021   HEMOGLOBIN A1C  06/25/2021   OPHTHALMOLOGY EXAM  02/01/2022   COLONOSCOPY (Pts 45-38yr Insurance coverage will need to be confirmed)  04/17/2030   Hepatitis C Screening  Completed   HIV Screening  Completed   HPV VACCINES  Aged Out   INFLUENZA VACCINE  Discontinued   PAP SMEAR-Modifier  Discontinued    Health Maintenance  Health Maintenance Due  Topic Date Due   TETANUS/TDAP  Never done   Zoster Vaccines- Shingrix (1 of 2) Never done   COVID-19 Vaccine (3 - Booster for Moderna series) 08/27/2019   FOOT EXAM  03/28/2021   MAMMOGRAM  05/01/2021    Colorectal cancer screening: Type of screening: Colonoscopy. Completed 04/16/2020. Repeat every 10  years  Mammogram status: Ordered 06/07/2021. Pt provided with contact info and advised to call to schedule appt.    Lung Cancer Screening: (Low Dose CT Chest recommended if Age 58-80years, 30 pack-year currently smoking OR have quit w/in 15years.) does qualify.   Lung Cancer Screening Referral: Yes  Additional Screening:  Hepatitis C Screening: does not qualify; Completed No  Vision Screening: Recommended annual ophthalmology exams for early detection of glaucoma and other disorders of the eye. Is the patient up to date with their annual eye exam?  No  Who is the provider or what is the name of the office in which the patient attends annual eye exams? N/A If pt is not established with a provider, would they like to be referred to a provider to establish care? Yes .   Dental Screening: Recommended annual dental exams for proper oral hygiene  Community Resource Referral / Chronic Care Management: CRR required this visit?  No   CCM required this visit?  No      Plan:     I have personally reviewed and noted the following in the patient's chart:   Medical and social history Use  of alcohol, tobacco or illicit drugs  Current medications and supplements including opioid prescriptions.  Functional ability and status Nutritional status Physical activity Advanced directives List of other physicians Hospitalizations, surgeries, and ER visits in previous 12 months Vitals Screenings to include cognitive, depression, and falls Referrals and appointments  In addition, I have reviewed and discussed with patient certain preventive protocols, quality metrics, and best practice recommendations. A written personalized care plan for preventive services as well as general preventive health recommendations were provided to patient.   Sydney Harris , Thank you for taking time to come for your Medicare Wellness Visit. I appreciate your ongoing commitment to your health goals. Please review the  following plan we discussed and let me know if I can assist you in the future.   These are the goals we discussed:  Goals   None     This is a list of the screening recommended for you and due dates:  Health Maintenance  Topic Date Due   Tetanus Vaccine  Never done   Zoster (Shingles) Vaccine (1 of 2) Never done   COVID-19 Vaccine (3 - Booster for Moderna series) 08/27/2019   Complete foot exam   03/28/2021   Mammogram  05/01/2021   Hemoglobin A1C  06/25/2021   Eye exam for diabetics  02/01/2022   Colon Cancer Screening  04/17/2030   Hepatitis C Screening: USPSTF Recommendation to screen - Ages 18-79 yo.  Completed   HIV Screening  Completed   HPV Vaccine  Aged Out   Flu Shot  Discontinued   Pap Smear  Discontinued      Sydney Harris, [facility]   06/07/2021   Nurse Notes: Patient would like to schedule a mammogram, low dose CT scan for lung cancer screening, as well as an opthalmology referral for routine eye exam. Sydney Harris has expressed she would like to have a order for a cane due to her arthritis. I have pended these orders for her PCP to approve or decline per recommendations. Patient was advised to contact the [employer] about silver sneakers program to see if she qualifies, also patient would like to see a dentist for teeth implants. I advised Sydney Harris she could call local dentist offices near her home and schedule appointment for consult without a referral.    I have reviewed and agreed with the above documentation.  Sydney Lo, NP

## 2021-06-11 MED ORDER — CANE MISC: 1.0000 | Freq: Every day | 0 refills | Status: DC | PRN

## 2021-10-02 ENCOUNTER — Ambulatory Visit: Payer: Medicare (Managed Care) | Attending: Critical Care Medicine | Admitting: Critical Care Medicine

## 2021-10-02 ENCOUNTER — Encounter: Payer: Self-pay | Admitting: Critical Care Medicine

## 2021-10-02 VITALS — BP 109/77 | HR 65 | Ht 63.0 in | Wt 192.8 lb

## 2021-10-02 DIAGNOSIS — Z111 Encounter for screening for respiratory tuberculosis: Secondary | ICD-10-CM

## 2021-10-02 DIAGNOSIS — G8929 Other chronic pain: Secondary | ICD-10-CM

## 2021-10-02 DIAGNOSIS — F319 Bipolar disorder, unspecified: Secondary | ICD-10-CM

## 2021-10-02 DIAGNOSIS — I10 Essential (primary) hypertension: Secondary | ICD-10-CM

## 2021-10-02 DIAGNOSIS — F411 Generalized anxiety disorder: Secondary | ICD-10-CM | POA: Insufficient documentation

## 2021-10-02 DIAGNOSIS — E1165 Type 2 diabetes mellitus with hyperglycemia: Secondary | ICD-10-CM | POA: Diagnosis not present

## 2021-10-02 DIAGNOSIS — M25562 Pain in left knee: Secondary | ICD-10-CM

## 2021-10-02 DIAGNOSIS — Z1231 Encounter for screening mammogram for malignant neoplasm of breast: Secondary | ICD-10-CM

## 2021-10-02 LAB — GLUCOSE, POCT (MANUAL RESULT ENTRY): POC Glucose: 166 mg/dl — AB (ref 70–99)

## 2021-10-02 MED ORDER — OMEPRAZOLE 40 MG PO CPDR: 40.0000 mg | DELAYED_RELEASE_CAPSULE | Freq: Every day | ORAL | 3 refills | Status: DC

## 2021-10-02 MED ORDER — BENZTROPINE MESYLATE 0.5 MG PO TABS: 0.5000 mg | ORAL_TABLET | Freq: Every day | ORAL | 4 refills | Status: DC

## 2021-10-02 MED ORDER — TRAZODONE HCL 100 MG PO TABS: 150.0000 mg | ORAL_TABLET | Freq: Every evening | ORAL | 2 refills | Status: DC | PRN

## 2021-10-02 MED ORDER — ARIPIPRAZOLE 15 MG PO TABS: 15.0000 mg | ORAL_TABLET | Freq: Every day | ORAL | 4 refills | Status: DC

## 2021-10-02 MED ORDER — VITAMIN D (ERGOCALCIFEROL) 1.25 MG (50000 UNIT) PO CAPS: 50000.0000 [IU] | ORAL_CAPSULE | ORAL | 4 refills | Status: DC

## 2021-10-02 MED ORDER — ESCITALOPRAM OXALATE 5 MG PO TABS: 5.0000 mg | ORAL_TABLET | Freq: Every day | ORAL | 4 refills | Status: DC

## 2021-10-02 MED ORDER — PRAZOSIN HCL 1 MG PO CAPS: 1.0000 mg | ORAL_CAPSULE | Freq: Every day | ORAL | 4 refills | Status: DC

## 2021-10-02 MED ORDER — ATORVASTATIN CALCIUM 10 MG PO TABS: 10.0000 mg | ORAL_TABLET | Freq: Every day | ORAL | 1 refills | Status: DC

## 2021-10-02 MED ORDER — METFORMIN HCL 500 MG PO TABS: 500.0000 mg | ORAL_TABLET | Freq: Every day | ORAL | 3 refills | Status: DC

## 2021-10-02 NOTE — Assessment & Plan Note (Signed)
Patient knows we can refer her to orthopedics if she wishes to consider knee replacement she wants to hold off for now

## 2021-10-02 NOTE — Patient Instructions (Addendum)
Complete health screening labs today will be obtained  No change in medications refill sent to your [facility]  Referral to psychiatry was made to establish with a mental health provider  Let us know when you decide you want to proceed with a knee replacement we will make referral to orthopedics  You declined flu shot and pneumonia shot and tetanus vaccines and Shingrix vaccines at this visit  Mammogram will be ordered again your colonoscopy was done in 2022 and was negative this does not need to be repeated again till 2032  Return to Dr. Joya Gaskins 4 months

## 2021-10-02 NOTE — Assessment & Plan Note (Signed)
Continue medications for anxiety and refer to psychiatry

## 2021-10-02 NOTE — Progress Notes (Signed)
Established Patient Office Visit  Subjective:  Patient ID: Sydney Harris, female    DOB: October 12, 1965  Age: 56 y.o. MRN: [id number]  CC:  Chief Complaint  Patient presents with   Diabetes    HPI Sydney Harris presents for diabetes follow-up.   12/2020  On arrival blood pressure 113/78 glucose 133.  Patient does have an eye appointment coming up in January.  Patient does see pain management and orthopedics for her chronic knee pain.  She is on periodic oxycodone.  Recent work-up laboratory wise is noted below from the pain clinic  We offered pneumonia vaccine she wishes to think about that  Vit D 52.5 nl CBC Normal RA factor < 7 Drug screen pos oxycodone/hydrocodone only ANA NEG CMET normal  Cr 0.9  K 3.7   LFT normal  Ca 10.3 ESR 49 slightly high The patient has been losing weight she is lost up to 20 pounds with dietary changes is improved her blood pressure control and diabetes significantly.  Her last A1c was 6.4  9/13   Patient seen in return follow-up arrival blood sugar 166 blood pressure 109/70.  Patient does not declined to receive any vaccines at this visit.  Patient has been doing very well overall blood sugars at home anywhere from 90-120.  She is going to [facility] pain clinic receives Percocet 4 times a day for chronic left knee pain.  She needs follow-up labs at this visit.  She does not have a mental health provider she does take chronic medications for bipolar disorder.  She been compliant with all her diabetic medications at this time. Past Medical History:  Diagnosis Date   Anxiety    Arthritis of left knee    Bipolar disorder ([place])    Blood transfusion 1981   "related to my daughter being born"   Depression    Diabetes mellitus without complication ([place])    Fibroid tumor    Gout    Hypertension    Plantar fasciitis    Substance abuse ([place])    crack cocaine  14 yrs sober.    Past Surgical History:  Procedure Laterality Date    ABDOMINAL HYSTERECTOMY     "for fibroid tumors"    Family History  Problem Relation Age of Onset   Diabetes Father    Asthma Daughter    Heart failure Neg Hx    Cancer Neg Hx    Colon polyps Neg Hx    Colon cancer Neg Hx    Esophageal cancer Neg Hx    Rectal cancer Neg Hx    Stomach cancer Neg Hx     Social History   Socioeconomic History   Marital status: Single    Spouse name: Not on file   Number of children: Not on file   Years of education: Not on file   Highest education level: 9th grade  Occupational History   Not on file  Tobacco Use   Smoking status: Former    Packs/day: 0.50    Years: 33.00    Total pack years: 16.50    Types: Cigarettes    Quit date: 2018    Years since quitting: 5.7   Smokeless tobacco: Never  Vaping Use   Vaping Use: Never used  Substance and Sexual Activity   Alcohol use: No   Drug use: Not Currently    Types: "Crack" cocaine, Marijuana    Comment: 06/30/2013 "last used crack ~ 03/2013"   Sexual activity: Yes  Other Topics Concern   Not on file  Social History Narrative   Not on file   Social Determinants of Health   Financial Resource Strain: Low Risk  (06/07/2021)   Overall Financial Resource Strain (CARDIA)    Difficulty of Paying Living Expenses: Not hard at all  Food Insecurity: No Food Insecurity (06/07/2021)   Hunger Vital Sign    Worried About Running Out of Food in the Last Year: Never true    Ran Out of Food in the Last Year: Never true  Transportation Needs: No Transportation Needs (06/07/2021)   PRAPARE - Hydrologist (Medical): No    Lack of Transportation (Non-Medical): No  Physical Activity: Sufficiently Active (06/07/2021)   Exercise Vital Sign    Days of Exercise per Week: 3 days    Minutes of Exercise per Session: 60 min  Stress: No Stress Concern Present (06/07/2021)   [place]    Feeling of Stress : Not at all   Social Connections: Moderately Isolated (06/07/2021)   Social Connection and Isolation Panel [NHANES]    Frequency of Communication with Friends and Family: Twice a week    Frequency of Social Gatherings with Friends and Family: More than three times a week    Attends Religious Services: More than 4 times per year    Active Member of [employer] or Organizations: No    Attends Archivist Meetings: Never    Marital Status: Never married  Intimate Partner Violence: Not At Risk (06/07/2021)   Humiliation, Afraid, Rape, and Kick questionnaire    Fear of Current or Ex-Partner: No    Emotionally Abused: No    Physically Abused: No    Sexually Abused: No    Outpatient Medications Prior to Visit  Medication Sig Dispense Refill   Accu-Chek Softclix Lancets lancets Use to measure blood sugar twice a day. E11.65 100 each 6   Blood Glucose Monitoring Suppl (ACCU-CHEK GUIDE ME) w/Device KIT Use to measure blood sugar twice a day. E11.65 1 kit 0   glucose blood (ACCU-CHEK GUIDE) test strip Use to measure blood sugar twice a day. E11.65 100 each 6   losartan-hydrochlorothiazide (HYZAAR) 50-12.5 MG tablet Take 1 tablet by mouth daily. 90 tablet 3   Misc. Devices (CANE) MISC 1 each by Does not apply route daily as needed. 1 each 0   Multiple Vitamin (MULTIVITAMIN ADULT PO) Take by mouth daily.     Multiple Vitamins-Minerals (HAIR SKIN AND NAILS FORMULA PO) Take by mouth daily.     naloxone (NARCAN) nasal spray 4 mg/0.1 mL SMARTSIG:Both Nares     oxyCODONE-acetaminophen (PERCOCET) 10-325 MG tablet Take 1 tablet by mouth 4 (four) times daily as needed.     ARIPiprazole (ABILIFY) 15 MG tablet Take 1 tablet (15 mg total) by mouth at bedtime. 30 tablet 4   atorvastatin (LIPITOR) 10 MG tablet Take 1 tablet (10 mg total) by mouth daily. 90 tablet 1   benztropine (COGENTIN) 0.5 MG tablet Take 1 tablet (0.5 mg total) by mouth at bedtime. 30 tablet 4   escitalopram (LEXAPRO) 5 MG tablet Take 1 tablet (5 mg  total) by mouth daily. 30 tablet 4   lidocaine (XYLOCAINE) 5 % ointment Apply 1 application. topically as needed. 35.44 g 0   metFORMIN (GLUCOPHAGE) 500 MG tablet Take 1 tablet (500 mg total) by mouth daily with breakfast. 60 tablet 3   omeprazole (PRILOSEC) 40 MG capsule Take 1  capsule (40 mg total) by mouth daily. 60 capsule 3   prazosin (MINIPRESS) 1 MG capsule Take 1 capsule (1 mg total) by mouth at bedtime. 30 capsule 4   traZODone (DESYREL) 100 MG tablet Take 1.5 tablets (150 mg total) by mouth at bedtime as needed. 45 tablet 2   Vitamin D, Ergocalciferol, (DRISDOL) 1.25 MG (50000 UNIT) CAPS capsule Take 1 capsule (50,000 Units total) by mouth once a week. 12 capsule 4   baclofen (LIORESAL) 10 MG tablet Take 1 tablet (10 mg total) by mouth 3 (three) times daily. (Patient not taking: Reported on 10/02/2021) 90 each 3   cloNIDine (CATAPRES) 0.1 MG tablet Take 1 tablet (0.1 mg total) by mouth 2 (two) times daily. (Patient not taking: Reported on 10/02/2021) 60 tablet 4   gabapentin (NEURONTIN) 100 MG capsule Take 1 capsule (100 mg total) by mouth 3 (three) times daily for 10 days. 30 capsule 0   No facility-administered medications prior to visit.    Allergies  Allergen Reactions   Penicillins Hives    Has patient had a PCN reaction causing immediate rash, facial/tongue/throat swelling, SOB or lightheadedness with hypotension: Yes Has patient had a PCN reaction causing severe rash involving mucus membranes or skin necrosis: Yes Has patient had a PCN reaction that required hospitalization No Has patient had a PCN reaction occurring within the last 10 years: Yes If all of the above answers are "NO", then may proceed with Cephalosporin use.    Tomato     hives    ROS Review of Systems  Constitutional: Negative.   HENT: Negative.  Negative for ear pain, postnasal drip, rhinorrhea, sinus pressure, sore throat, trouble swallowing and voice change.   Eyes: Negative.   Respiratory: Negative.   Negative for apnea, cough, choking, chest tightness, shortness of breath, wheezing and stridor.   Cardiovascular: Negative.  Negative for chest pain, palpitations and leg swelling.  Gastrointestinal: Negative.  Negative for abdominal distention, abdominal pain, nausea and vomiting.  Genitourinary: Negative.   Musculoskeletal:  Positive for joint swelling. Negative for arthralgias and myalgias.       Chronic left knee pain  Skin: Negative.  Negative for rash.  Allergic/Immunologic: Negative.  Negative for environmental allergies and food allergies.  Neurological: Negative.  Negative for dizziness, syncope, weakness and headaches.  Hematological: Negative.  Negative for adenopathy. Does not bruise/bleed easily.  Psychiatric/Behavioral: Negative.  Negative for agitation and sleep disturbance. The patient is not nervous/anxious.       Objective:    Physical Exam Vitals reviewed.  Constitutional:      Appearance: Normal appearance. She is well-developed. She is obese. She is not diaphoretic.  HENT:     Head: Normocephalic and atraumatic.     Nose: No nasal deformity, septal deviation, mucosal edema or rhinorrhea.     Right Sinus: No maxillary sinus tenderness or frontal sinus tenderness.     Left Sinus: No maxillary sinus tenderness or frontal sinus tenderness.     Mouth/Throat:     Pharynx: No oropharyngeal exudate.  Eyes:     General: No scleral icterus.    Conjunctiva/sclera: Conjunctivae normal.     Pupils: Pupils are equal, round, and reactive to light.  Neck:     Thyroid: No thyromegaly.     Vascular: No carotid bruit or JVD.     Trachea: Trachea normal. No tracheal tenderness or tracheal deviation.  Cardiovascular:     Rate and Rhythm: Normal rate and regular rhythm.     Chest  Wall: PMI is not displaced.     Pulses: Normal pulses. No decreased pulses.     Heart sounds: Normal heart sounds, S1 normal and S2 normal. Heart sounds not distant. No murmur heard.    No systolic  murmur is present.     No diastolic murmur is present.     No friction rub. No gallop. No S3 or S4 sounds.  Pulmonary:     Effort: No tachypnea, accessory muscle usage or respiratory distress.     Breath sounds: No stridor. No decreased breath sounds, wheezing, rhonchi or rales.  Chest:     Chest wall: No tenderness.  Abdominal:     General: Bowel sounds are normal. There is no distension.     Palpations: Abdomen is soft. Abdomen is not rigid.     Tenderness: There is no abdominal tenderness. There is no guarding or rebound.  Musculoskeletal:        General: Normal range of motion.     Cervical back: Normal range of motion and neck supple. No edema, erythema or rigidity. No muscular tenderness. Normal range of motion.  Lymphadenopathy:     Head:     Right side of head: No submental or submandibular adenopathy.     Left side of head: No submental or submandibular adenopathy.     Cervical: No cervical adenopathy.  Skin:    General: Skin is warm and dry.     Coloration: Skin is not pale.     Findings: No rash.     Nails: There is no clubbing.  Neurological:     Mental Status: She is alert and oriented to person, place, and time.     Sensory: No sensory deficit.  Psychiatric:        Speech: Speech normal.        Behavior: Behavior normal.     BP 109/77   Pulse 65   Ht _0  (1.6 m)   Wt 192 lb 12.8 oz (87.5 kg)   LMP 12/28/2010   SpO2 100%   BMI 34.15 kg/m  Wt Readings from Last 3 Encounters:  10/02/21 192 lb 12.8 oz (87.5 kg)  12/25/20 197 lb 6.4 oz (89.5 kg)  07/30/20 210 lb (95.3 kg)     Health Maintenance Due  Topic Date Due   COVID-19 Vaccine (3 - Moderna series) 08/27/2019   Diabetic kidney evaluation - GFR measurement  12/20/2020   Diabetic kidney evaluation - Urine ACR  12/20/2020   FOOT EXAM  03/28/2021   MAMMOGRAM  05/01/2021   HEMOGLOBIN A1C  06/25/2021    There are no preventive care reminders to display for this patient.  No results found for:  "TSH" Lab Results  Component Value Date   WBC 6.6 12/21/2019   HGB 14.5 12/21/2019   HCT 43.2 12/21/2019   MCV 87 12/21/2019   PLT 349 12/21/2019   Lab Results  Component Value Date   NA 140 12/21/2019   K 4.2 12/21/2019   CO2 24 12/21/2019   GLUCOSE 97 12/21/2019   BUN 21 12/21/2019   CREATININE 0.95 12/21/2019   BILITOT 0.3 12/21/2019   ALKPHOS 64 12/21/2019   AST 16 12/21/2019   ALT 19 12/21/2019   PROT 7.6 12/21/2019   ALBUMIN 4.9 12/21/2019   CALCIUM 10.8 (H) 12/21/2019   ANIONGAP 10 02/08/2016   Lab Results  Component Value Date   CHOL 261 (H) 12/21/2019   Lab Results  Component Value Date   HDL 65 12/21/2019  Lab Results  Component Value Date   LDLCALC 174 (H) 12/21/2019   Lab Results  Component Value Date   TRIG 122 12/21/2019   Lab Results  Component Value Date   CHOLHDL 4.0 12/21/2019   Lab Results  Component Value Date   HGBA1C 6.0 12/25/2020      Assessment & Plan:   Problem List Items Addressed This Visit       Cardiovascular and Mediastinum   HTN (hypertension)    Blood pressure controlled at this time no change made we will maintain losartan HCT and obtain metabolic panel and blood count      Relevant Medications   atorvastatin (LIPITOR) 10 MG tablet   prazosin (MINIPRESS) 1 MG capsule   Other Relevant Orders   CBC with Differential/Platelet     Endocrine   Type 2 diabetes mellitus with hyperglycemia, without long-term current use of insulin (HCC) - Primary    Type 2 diabetes well controlled we will obtain A1c today through lab work check lipid panel continue metformin      Relevant Medications   atorvastatin (LIPITOR) 10 MG tablet   metFORMIN (GLUCOPHAGE) 500 MG tablet   Other Relevant Orders   HgB A1c   Urine microalbumin-creatinine with uACR   Comprehensive metabolic panel with eGFR (no eGFR if sent to [facility])   POCT glucose (manual entry) (Completed)   Lipid panel     Other   Chronic pain of left knee    Patient  knows we can refer her to orthopedics if she wishes to consider knee replacement she wants to hold off for now      Relevant Medications   escitalopram (LEXAPRO) 5 MG tablet   traZODone (DESYREL) 100 MG tablet   Bipolar 1 disorder ([place])    Continue current medications for bipolar disorder and again referred to psychiatry      Relevant Orders   Ambulatory referral to Psychiatry   Generalized anxiety disorder    Continue medications for anxiety and refer to psychiatry      Relevant Medications   escitalopram (LEXAPRO) 5 MG tablet   traZODone (DESYREL) 100 MG tablet   Other Relevant Orders   Ambulatory referral to Psychiatry   Other Visit Diagnoses     Encounter for screening mammogram for malignant neoplasm of breast       Relevant Orders   MM DIGITAL SCREENING BILATERAL   Screening examination for pulmonary tuberculosis          Meds ordered this encounter  Medications   ARIPiprazole (ABILIFY) 15 MG tablet    Sig: Take 1 tablet (15 mg total) by mouth at bedtime.    Dispense:  30 tablet    Refill:  4   atorvastatin (LIPITOR) 10 MG tablet    Sig: Take 1 tablet (10 mg total) by mouth daily.    Dispense:  90 tablet    Refill:  1   benztropine (COGENTIN) 0.5 MG tablet    Sig: Take 1 tablet (0.5 mg total) by mouth at bedtime.    Dispense:  30 tablet    Refill:  4   escitalopram (LEXAPRO) 5 MG tablet    Sig: Take 1 tablet (5 mg total) by mouth daily.    Dispense:  30 tablet    Refill:  4   metFORMIN (GLUCOPHAGE) 500 MG tablet    Sig: Take 1 tablet (500 mg total) by mouth daily with breakfast.    Dispense:  90 tablet    Refill:  3   omeprazole (PRILOSEC) 40 MG capsule    Sig: Take 1 capsule (40 mg total) by mouth daily.    Dispense:  60 capsule    Refill:  3   prazosin (MINIPRESS) 1 MG capsule    Sig: Take 1 capsule (1 mg total) by mouth at bedtime.    Dispense:  30 capsule    Refill:  4   traZODone (DESYREL) 100 MG tablet    Sig: Take 1.5 tablets (150 mg total)  by mouth at bedtime as needed.    Dispense:  45 tablet    Refill:  2   Vitamin D, Ergocalciferol, (DRISDOL) 1.25 MG (50000 UNIT) CAPS capsule    Sig: Take 1 capsule (50,000 Units total) by mouth once a week.    Dispense:  12 capsule    Refill:  4    Follow-up: Return in about 4 months (around 02/01/2022) for htn, diabetes.    Asencion Noble, MD

## 2021-10-02 NOTE — Assessment & Plan Note (Signed)
Type 2 diabetes well controlled we will obtain A1c today through lab work check lipid panel continue metformin

## 2021-10-02 NOTE — Assessment & Plan Note (Signed)
Blood pressure controlled at this time no change made we will maintain losartan HCT and obtain metabolic panel and blood count

## 2021-10-02 NOTE — Assessment & Plan Note (Signed)
Continue current medications for bipolar disorder and again referred to psychiatry

## 2021-10-03 LAB — COMPREHENSIVE METABOLIC PANEL
ALT: 15 IU/L (ref 0–32)
AST: 14 IU/L (ref 0–40)
Albumin/Globulin Ratio: 1.9 (ref 1.2–2.2)
Albumin: 4.9 g/dL (ref 3.8–4.9)
Alkaline Phosphatase: 64 IU/L (ref 44–121)
BUN/Creatinine Ratio: 22 (ref 9–23)
BUN: 19 mg/dL (ref 6–24)
Bilirubin Total: 0.2 mg/dL (ref 0.0–1.2)
CO2: 27 mmol/L (ref 20–29)
Calcium: 10.6 mg/dL — ABNORMAL HIGH (ref 8.7–10.2)
Chloride: 103 mmol/L (ref 96–106)
Creatinine, Ser: 0.88 mg/dL (ref 0.57–1.00)
Globulin, Total: 2.6 g/dL (ref 1.5–4.5)
Glucose: 85 mg/dL (ref 70–99)
Potassium: 4.3 mmol/L (ref 3.5–5.2)
Sodium: 145 mmol/L — ABNORMAL HIGH (ref 134–144)
Total Protein: 7.5 g/dL (ref 6.0–8.5)
eGFR: 76 mL/min/{1.73_m2} (ref >59)

## 2021-10-03 LAB — CBC WITH DIFFERENTIAL/PLATELET
Basophils Absolute: 0.1 10*3/uL (ref 0.0–0.2)
Basos: 1 %
EOS (ABSOLUTE): 0.2 10*3/uL (ref 0.0–0.4)
Eos: 2 %
Hematocrit: 40.5 % (ref 34.0–46.6)
Hemoglobin: 13.4 g/dL (ref 11.1–15.9)
Immature Grans (Abs): 0 10*3/uL (ref 0.0–0.1)
Immature Granulocytes: 0 %
Lymphocytes Absolute: 3.7 10*3/uL — ABNORMAL HIGH (ref 0.7–3.1)
Lymphs: 44 %
MCH: 28.6 pg (ref 26.6–33.0)
MCHC: 33.1 g/dL (ref 31.5–35.7)
MCV: 87 fL (ref 79–97)
Monocytes Absolute: 0.5 10*3/uL (ref 0.1–0.9)
Monocytes: 6 %
Neutrophils Absolute: 4 10*3/uL (ref 1.4–7.0)
Neutrophils: 47 %
Platelets: 426 10*3/uL (ref 150–450)
RBC: 4.68 x10E6/uL (ref 3.77–5.28)
RDW: 12.3 % (ref 11.7–15.4)
WBC: 8.4 10*3/uL (ref 3.4–10.8)

## 2021-10-03 LAB — LIPID PANEL
Chol/HDL Ratio: 2.9 ratio (ref 0.0–4.4)
Cholesterol, Total: 200 mg/dL — ABNORMAL HIGH (ref 100–199)
HDL: 68 mg/dL (ref >39)
LDL Chol Calc (NIH): 118 mg/dL — ABNORMAL HIGH (ref 0–99)
Triglycerides: 76 mg/dL (ref 0–149)
VLDL Cholesterol Cal: 14 mg/dL (ref 5–40)

## 2021-10-03 LAB — MICROALBUMIN / CREATININE URINE RATIO
Creatinine, Urine: 59 mg/dL
Microalb/Creat Ratio: <5 mg/g creat (ref 0–29)
Microalbumin, Urine: <3 ug/mL

## 2021-10-03 NOTE — Progress Notes (Signed)
Let patient know urine for microalbumin was normal, blood counts normal cholesterol improved but still mildly elevated, kidney and liver normal  Stay on cholesterol medication other medications as prescribed

## 2021-10-04 ENCOUNTER — Telehealth: Payer: Self-pay

## 2021-10-04 NOTE — Telephone Encounter (Signed)
Pt was called and is aware of results, DOB was confirmed.  ?

## 2021-10-04 NOTE — Telephone Encounter (Signed)
-----   Message from Elsie Stain, MD sent at 10/03/2021  3:15 PM EDT ----- Let patient know urine for microalbumin was normal, blood counts normal cholesterol improved but still mildly elevated, kidney and liver normal  Stay on cholesterol medication other medications as prescribed

## 2021-10-10 ENCOUNTER — Other Ambulatory Visit: Payer: Self-pay | Admitting: Critical Care Medicine

## 2021-10-10 DIAGNOSIS — R928 Other abnormal and inconclusive findings on diagnostic imaging of breast: Secondary | ICD-10-CM

## 2021-10-15 ENCOUNTER — Other Ambulatory Visit: Payer: Self-pay | Admitting: Critical Care Medicine

## 2021-10-15 DIAGNOSIS — R928 Other abnormal and inconclusive findings on diagnostic imaging of breast: Secondary | ICD-10-CM

## 2021-10-15 DIAGNOSIS — N6310 Unspecified lump in the right breast, unspecified quadrant: Secondary | ICD-10-CM

## 2021-10-18 ENCOUNTER — Ambulatory Visit
Admission: RE | Admit: 2021-10-18 | Discharge: 2021-10-18 | Disposition: A | Discharge status: Home / Self Care | Payer: Medicare (Managed Care) | Source: Ambulatory Visit | Attending: Critical Care Medicine | Admitting: Critical Care Medicine

## 2021-10-18 ENCOUNTER — Telehealth: Payer: Self-pay

## 2021-10-18 DIAGNOSIS — N6310 Unspecified lump in the right breast, unspecified quadrant: Secondary | ICD-10-CM

## 2021-10-18 DIAGNOSIS — R928 Other abnormal and inconclusive findings on diagnostic imaging of breast: Secondary | ICD-10-CM

## 2021-10-18 NOTE — Telephone Encounter (Signed)
Pt was called and is aware of results, DOB was confirmed.  ?

## 2021-10-18 NOTE — Progress Notes (Signed)
Let patient know mammogram is normal. Recheck in one year

## 2021-10-18 NOTE — Telephone Encounter (Signed)
-----   Message from Elsie Stain, MD sent at 10/18/2021 11:33 AM EDT ----- Let patient know mammogram is normal. Recheck in one year

## 2021-10-29 ENCOUNTER — Ambulatory Visit (HOSPITAL_COMMUNITY)
Admission: EM | Admit: 2021-10-29 | Discharge: 2021-10-29 | Disposition: A | Discharge status: Home / Self Care | Payer: Medicare (Managed Care) | Attending: Family Medicine | Admitting: Family Medicine

## 2021-10-29 ENCOUNTER — Encounter (HOSPITAL_COMMUNITY): Payer: Self-pay | Admitting: *Deleted

## 2021-10-29 DIAGNOSIS — N898 Other specified noninflammatory disorders of vagina: Secondary | ICD-10-CM | POA: Insufficient documentation

## 2021-10-29 LAB — HIV ANTIBODY (ROUTINE TESTING W REFLEX): HIV Screen 4th Generation wRfx: NONREACTIVE

## 2021-10-29 MED ORDER — FLUCONAZOLE 150 MG PO TABS: 150.0000 mg | ORAL_TABLET | Freq: Every day | ORAL | 0 refills | Status: AC

## 2021-10-29 NOTE — Discharge Instructions (Addendum)
Today you are being treated prophylactically for yeast.   Take diflucan 150 mg once, if symptoms still present in 3 days then you may take second pill   Yeast infections which are caused by a naturally occurring fungus called candida. Vaginosis is an inflammation of the vagina that can result in discharge, itching and pain. The cause is usually a change in the normal balance of vaginal bacteria or an infection. Vaginosis can also result from reduced estrogen levels after menopause.  Labs pending 2-3 days, you will be contacted if positive for any sti and treatment will be sent to the pharmacy, you will have to return to the clinic if positive for gonorrhea to receive treatment   Please refrain from having sex until labs results, if positive please refrain from having sex until treatment complete and symptoms resolve   If positive for HIV, Syphilis, Chlamydia  gonorrhea or trichomoniasis please notify partner or partners so they may tested as well  Moving forward, it is recommended you use some form of protection against the transmission of sti infections  such as condoms or dental dams with each sexual encounter    In addition:   Avoid baths, hot tubs and whirlpool spas.  Don't use scented or harsh soaps, such as those with deodorant or antibacterial action. Avoid irritants. These include scented tampons and pads. Wipe from front to back after using the toilet.  Don't douche. Your vagina doesn't require cleansing other than normal bathing.  Use a  condom. Wear cotton underwear, this fabric helps absorb moisture

## 2021-10-29 NOTE — ED Provider Notes (Signed)
Sydney Harris    CSN: [id number] Arrival date & time: 10/29/21  0920      History   Chief Complaint Chief Complaint  Patient presents with   Abdominal Pain   Vaginal Itching    HPI Sydney Harris is a 56 y.o. female.   Patient presents with thick Sydney Harris vaginal discharge with odor, itching and irritation and lower abdominal pain for 2 to 3 days.  Has not attempted treatment of symptoms.  Sexually active, 1 female partner, no condom use, no known exposure.  Denies urinary symptoms, new rash or lesions, flank pain, fever or chills.    Past Medical History:  Diagnosis Date   Anxiety    Arthritis of left knee    Bipolar disorder ([place])    Blood transfusion 1981   "related to my daughter being born"   Depression    Diabetes mellitus without complication ([place])    Fibroid tumor    Gout    Hypertension    Plantar fasciitis    Substance abuse ([place])    crack cocaine  14 yrs sober.    Patient Active Problem List   Diagnosis Date Noted   Bipolar 1 disorder ([place]) 10/02/2021   Generalized anxiety disorder 10/02/2021   HTN (hypertension) 03/28/2020   Chronic bilateral low back pain without sciatica 12/28/2019   Hyperlipidemia associated with type 2 diabetes mellitus ([place]) 12/22/2019   Chronic pain of left knee 12/21/2019   Type 2 diabetes mellitus with hyperglycemia, without long-term current use of insulin ([place]) 12/21/2019    Past Surgical History:  Procedure Laterality Date   ABDOMINAL HYSTERECTOMY     "for fibroid tumors"    OB History   No obstetric history on file.      Home Medications    Prior to Admission medications   Medication Sig Start Date End Date Taking? Authorizing Provider  Accu-Chek Softclix Lancets lancets Use to measure blood sugar twice a day. E11.65 12/26/19  Yes Elsie Stain, MD  ARIPiprazole (ABILIFY) 15 MG tablet Take 1 tablet (15 mg total) by mouth at bedtime. 10/02/21  Yes Elsie Stain, MD  atorvastatin (LIPITOR) 10  MG tablet Take 1 tablet (10 mg total) by mouth daily. 10/02/21  Yes Elsie Stain, MD  benztropine (COGENTIN) 0.5 MG tablet Take 1 tablet (0.5 mg total) by mouth at bedtime. 10/02/21  Yes Elsie Stain, MD  Blood Glucose Monitoring Suppl (ACCU-CHEK GUIDE ME) w/Device KIT Use to measure blood sugar twice a day. E11.65 12/26/19  Yes Elsie Stain, MD  escitalopram (LEXAPRO) 5 MG tablet Take 1 tablet (5 mg total) by mouth daily. 10/02/21  Yes Elsie Stain, MD  glucose blood (ACCU-CHEK GUIDE) test strip Use to measure blood sugar twice a day. E11.65 12/26/19  Yes Elsie Stain, MD  losartan-hydrochlorothiazide (HYZAAR) 50-12.5 MG tablet Take 1 tablet by mouth daily. 12/25/20  Yes Elsie Stain, MD  metFORMIN (GLUCOPHAGE) 500 MG tablet Take 1 tablet (500 mg total) by mouth daily with breakfast. 10/02/21  Yes Elsie Stain, MD  Misc. Devices (CANE) MISC 1 each by Does not apply route daily as needed. 06/11/21  Yes Elsie Stain, MD  Multiple Vitamin (MULTIVITAMIN ADULT PO) Take by mouth daily.   Yes [provider]  Multiple Vitamins-Minerals (HAIR SKIN AND NAILS FORMULA PO) Take by mouth daily.   Yes [provider]  naloxone [facility]) nasal spray 4 mg/0.1 mL SMARTSIG:Both Nares 09/21/20  Yes [provider]  omeprazole (PRILOSEC) 40 MG capsule Take 1 capsule (40 mg total) by mouth daily. 10/02/21  Yes Elsie Stain, MD  oxyCODONE-acetaminophen (PERCOCET) 10-325 MG tablet Take 1 tablet by mouth 4 (four) times daily as needed. 10/24/20  Yes [provider]  prazosin (MINIPRESS) 1 MG capsule Take 1 capsule (1 mg total) by mouth at bedtime. 10/02/21  Yes Elsie Stain, MD  traZODone (DESYREL) 100 MG tablet Take 1.5 tablets (150 mg total) by mouth at bedtime as needed. 10/02/21  Yes Elsie Stain, MD  Vitamin D, Ergocalciferol, (DRISDOL) 1.25 MG (50000 UNIT) CAPS capsule Take 1 capsule (50,000 Units total) by mouth once a week. 10/02/21  Yes  Elsie Stain, MD  baclofen (LIORESAL) 10 MG tablet Take 1 tablet (10 mg total) by mouth 3 (three) times daily. Patient not taking: Reported on 10/02/2021 12/25/20   Elsie Stain, MD    Family History Family History  Problem Relation Age of Onset   Diabetes Father    Asthma Daughter    Heart failure Neg Hx    Cancer Neg Hx    Colon polyps Neg Hx    Colon cancer Neg Hx    Esophageal cancer Neg Hx    Rectal cancer Neg Hx    Stomach cancer Neg Hx     Social History Social History   Tobacco Use   Smoking status: Former    Packs/day: 0.50    Years: 33.00    Total pack years: 16.50    Types: Cigarettes    Quit date: 2018    Years since quitting: 5.7   Smokeless tobacco: Never  Vaping Use   Vaping Use: Never used  Substance Use Topics   Alcohol use: No   Drug use: Not Currently    Types: "Crack" cocaine, Marijuana    Comment: 06/30/2013 "last used crack ~ 03/2013"     Allergies   Penicillins and Tomato   Review of Systems Review of Systems  Constitutional: Negative.   Respiratory: Negative.    Cardiovascular: Negative.   Genitourinary:  Positive for pelvic pain, vaginal discharge and vaginal pain. Negative for decreased urine volume, difficulty urinating, dyspareunia, dysuria, enuresis, flank pain, frequency, genital sores, hematuria, menstrual problem, urgency and vaginal bleeding.  Skin: Negative.      Physical Exam Triage Vital Signs ED Triage Vitals  Enc Vitals Group     BP 10/29/21 1003 103/69     Pulse Rate 10/29/21 1003 72     Resp 10/29/21 1003 18     Temp 10/29/21 1003 97.8 F (36.6 C)     Temp Source 10/29/21 1003 Oral     SpO2 10/29/21 1003 96 %     Weight --      Height --      Head Circumference --      Peak Flow --      Pain Score 10/29/21 1001 0     Pain Loc --      Pain Edu? --      Excl. in [place]? --    No data found.  Updated Vital Signs BP 103/69 (BP Location: Left Arm)   Pulse 72   Temp 97.8 F (36.6 C) (Oral)   Resp 18    LMP 12/28/2010   SpO2 96%   Visual Acuity Right Eye Distance:   Left Eye Distance:   Bilateral Distance:    Right Eye Near:   Left Eye Near:    Bilateral Near:  Physical Exam Constitutional:      Appearance: Normal appearance. She is well-developed.  Eyes:     Extraocular Movements: Extraocular movements intact.  Pulmonary:     Effort: Pulmonary effort is normal.  Abdominal:     General: Abdomen is flat. Bowel sounds are normal.     Palpations: Abdomen is soft.     Tenderness: There is abdominal tenderness in the suprapubic area.  Genitourinary:    Comments: deferred Neurological:     Mental Status: She is alert and oriented to person, place, and time. Mental status is at baseline.      UC Treatments / Results  Labs (all labs ordered are listed, but only abnormal results are displayed) Labs Reviewed  CERVICOVAGINAL ANCILLARY ONLY    EKG   Radiology No results found.  Procedures Procedures (including critical care time)  Medications Ordered in UC Medications - No data to display  Initial Impression / Assessment and Plan / UC Course  I have reviewed the triage vital signs and the nursing notes.  Pertinent labs & imaging results that were available during my care of the patient were reviewed by me and considered in my medical decision making (see chart for details).  Vaginal discharge   We will treat prophylactically for yeast, Diflucan prescribed, discussed administration , STI labs pending will treat per protocol, advised abstinence until lab results, and/or treatment is complete, advised condom use during all sexual encounters moving, may follow-up with urgent care as needed  Final Clinical Impressions(s) / UC Diagnoses   Final diagnoses:  None   Discharge Instructions   None    ED Prescriptions   None    PDMP not reviewed this encounter.   Hans Eden, NP 10/29/21 1029

## 2021-10-29 NOTE — ED Triage Notes (Signed)
Pt states that she has lower abdominal pain and vaginal itching and a foul odor X 2 days. She has been having unprotected sex.

## 2021-10-30 LAB — T.PALLIDUM AB, TOTAL: T Pallidum Abs: REACTIVE — AB

## 2021-10-30 LAB — CERVICOVAGINAL ANCILLARY ONLY
Bacterial Vaginitis (gardnerella): POSITIVE — AB
Candida Glabrata: NEGATIVE
Candida Vaginitis: POSITIVE — AB
Chlamydia: NEGATIVE
Comment: NEGATIVE
Comment: NEGATIVE
Comment: NEGATIVE
Comment: NEGATIVE
Comment: NEGATIVE
Comment: NORMAL
Neisseria Gonorrhea: NEGATIVE
Trichomonas: POSITIVE — AB

## 2021-10-31 ENCOUNTER — Telehealth (HOSPITAL_COMMUNITY): Payer: Self-pay | Admitting: Emergency Medicine

## 2021-10-31 LAB — RPR
RPR Ser Ql: REACTIVE — AB
RPR Titer: 1:1 {titer}

## 2021-10-31 MED ORDER — METRONIDAZOLE 500 MG PO TABS: 500.0000 mg | ORAL_TABLET | Freq: Two times a day (BID) | ORAL | 0 refills | Status: DC

## 2021-11-05 ENCOUNTER — Other Ambulatory Visit: Payer: Self-pay

## 2021-11-05 ENCOUNTER — Encounter: Payer: Self-pay | Admitting: Internal Medicine

## 2021-11-05 ENCOUNTER — Ambulatory Visit (INDEPENDENT_AMBULATORY_CARE_PROVIDER_SITE_OTHER): Payer: Medicare (Managed Care) | Admitting: Internal Medicine

## 2021-11-05 VITALS — BP 103/70 | HR 59 | Temp 97.4°F | Ht 63.0 in | Wt 193.0 lb

## 2021-11-05 DIAGNOSIS — Z1159 Encounter for screening for other viral diseases: Secondary | ICD-10-CM

## 2021-11-05 DIAGNOSIS — A599 Trichomoniasis, unspecified: Secondary | ICD-10-CM | POA: Diagnosis not present

## 2021-11-05 DIAGNOSIS — Z7251 High risk heterosexual behavior: Secondary | ICD-10-CM | POA: Diagnosis not present

## 2021-11-05 DIAGNOSIS — A528 Late syphilis, latent: Secondary | ICD-10-CM | POA: Diagnosis not present

## 2021-11-05 DIAGNOSIS — N760 Acute vaginitis: Secondary | ICD-10-CM

## 2021-11-05 DIAGNOSIS — B9689 Other specified bacterial agents as the cause of diseases classified elsewhere: Secondary | ICD-10-CM

## 2021-11-05 DIAGNOSIS — Z113 Encounter for screening for infections with a predominantly sexual mode of transmission: Secondary | ICD-10-CM

## 2021-11-05 MED ORDER — PENICILLIN G BENZATHINE 1200000 UNIT/2ML IM SUSY
1.2000 10*6.[IU] | PREFILLED_SYRINGE | Freq: Once | INTRAMUSCULAR | Status: AC
  Administered 2021-11-05: 1.2 10*6.[IU] via INTRAMUSCULAR

## 2021-11-05 NOTE — Addendum Note (Signed)
Addended by: Lucie Leather D on: 11/05/2021 10:03 AM   Modules accepted: Orders

## 2021-11-05 NOTE — Progress Notes (Signed)
[facility]  Reason for Consult:std Referring Provider: urgent care     Patient Active Problem List   Diagnosis Date Noted   Bipolar 1 disorder ([place]) 10/02/2021   Generalized anxiety disorder 10/02/2021   HTN (hypertension) 03/28/2020   Chronic bilateral low back pain without sciatica 12/28/2019   Hyperlipidemia associated with type 2 diabetes mellitus ([place]) 12/22/2019   Chronic pain of left knee 12/21/2019   Type 2 diabetes mellitus with hyperglycemia, without long-term current use of insulin ([place]) 12/21/2019      HPI: Sydney Harris is a 56 y.o. female referred by urgent care std  She saw urgent care on 10/29/21 for lower abdominal pain and vaginal discharge. 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 She takes amoxicillin fine for dental infection. She last took this 2 years.  Social: She is not in a relationship at this time. She is no longer with the female who gave this to her; she had unprotected sex with this partner a month ago. She was never screened for it before. She refuses to divulge other details   Other ros No headache, visual change, hearing loss No chest pain, cough, joint pain, dysuria, urinary frequency/urgency   Hiv screen was negative  The urgent care didn't test hepatitis; her lft was normal  She didn't have any flu like symptoms otherwise  Review of Systems: ROS All other ROS negative         Past Medical History:  Diagnosis Date   Anxiety    Arthritis of left knee    Bipolar disorder ([place])    Blood transfusion 1981   "related to my daughter being born"   Depression    Diabetes mellitus without complication ([place])    Fibroid tumor    Gout    Hypertension    Plantar fasciitis    Substance abuse ([place])    crack cocaine  14 yrs sober.     Social History   Tobacco Use   Smoking status: Former    Packs/day: 0.50    Years: 33.00    Total pack years: 16.50    Types: Cigarettes    Quit date: 2018    Years since quitting: 5.7   Smokeless tobacco: Never  Vaping Use   Vaping Use: Never used  Substance Use Topics   Alcohol use: No   Drug use: Not Currently    Types: "Crack" cocaine, Marijuana    Comment: 06/30/2013 "last used crack ~ 03/2013"    Family History  Problem Relation Age of Onset   Diabetes Father    Asthma Daughter    Heart failure Neg Hx    Cancer Neg Hx    Colon polyps Neg Hx    Colon cancer Neg Hx    Esophageal cancer Neg Hx    Rectal cancer Neg Hx    Stomach cancer Neg Hx     Allergies  Allergen Reactions   Penicillins Hives    Has patient had a PCN reaction causing immediate rash, facial/tongue/throat swelling, SOB or lightheadedness with hypotension: Yes Has patient had a PCN reaction causing severe rash involving mucus membranes or skin necrosis: Yes Has patient had a PCN reaction that required hospitalization No Has patient had a PCN reaction occurring within the last 10 years: Yes If all  of the above answers are "NO", then may proceed with Cephalosporin use.    Tomato     hives    OBJECTIVE: Vitals:   11/05/21 0824  BP: 103/70  Pulse: (!) 59  Temp: (!) 97.4 F (36.3 C)  TempSrc: Oral  SpO2: 100%  Weight: 193 lb (87.5 kg)  Height: 5' 3"  (1.6 m)   Body mass index is 34.19 kg/m.   Physical Exam General/constitutional: no distress, pleasant HEENT: Normocephalic, PER, Conj Clear, EOMI, Oropharynx clear Neck supple CV: rrr no mrg Lungs: clear to auscultation, normal respiratory effort Abd: Soft, Nontender Ext: no edema Skin: No Rash Neuro: nonfocal MSK: no peripheral joint swelling/tenderness/warmth; back spines nontender   Lab: Lab Results  Component Value Date   WBC 8.4 10/02/2021   HGB 13.4 10/02/2021   HCT 40.5 10/02/2021   MCV 87 10/02/2021   PLT 426  21/30/8657   Last metabolic panel Lab Results  Component Value Date   GLUCOSE 85 10/02/2021   NA 145 (H) 10/02/2021   K 4.3 10/02/2021   CL 103 10/02/2021   CO2 27 10/02/2021   BUN 19 10/02/2021   CREATININE 0.88 10/02/2021   EGFR 76 10/02/2021   CALCIUM 10.6 (H) 10/02/2021   PROT 7.5 10/02/2021   ALBUMIN 4.9 10/02/2021   LABGLOB 2.6 10/02/2021   AGRATIO 1.9 10/02/2021   BILITOT 0.2 10/02/2021   ALKPHOS 64 10/02/2021   AST 14 10/02/2021   ALT 15 10/02/2021   ANIONGAP 10 02/08/2016       Microbiology:  Serology:  Imaging:   Assessment/plan: Problem List Items Addressed This Visit   None Visit Diagnoses     High risk heterosexual behavior    -  Primary   Late latent syphilis       Relevant Orders   RPR   Trichomonas infection       Bacterial vaginosis       Need for hepatitis B screening test       Relevant Orders   Hepatitis B surface antigen   Hepatitis B core antibody, total   Hepatitis B surface antibody,qualitative   Need for hepatitis C screening test       Relevant Orders   Hepatitis C antibody   Screening for STDs (sexually transmitted diseases)       Relevant Orders   HIV antibody (with reflex)   RPR       Patient is rather sensitive about me documenting what we spoke about. I advise her to request medical record for "break the glass" restriction   She will get treatment for late latent syphilis with pcn benzathine x3 weekly. She took amoxicillin fine. She was told she has pcn allergy but didn't know about it or what reaction. We discussed 4 weeks doxycycline with less data but she opted to go with penicillin  She needs screening for hepatitis again in around 2 months  She need also hiv screening again to confirm negativity of initial test. I offer one today but she wants to wait till 78moe months  She doesn't think she has hepatitis or hiv, and I stated that I don't know and that's why I am screening as many times these infections can be  assymptomatic earlier on  We talked about pep/prep but she didn't want to do any a this time "I will never do this crazy thing again." I asked her to think about them and let me know if she is concerned  We also talked about repeating vaginal  testing if she is still symptomatic in another week or 2 (she is finishing another day of 7 day course flagyl by tomorrow).  I have spent a total of 65 minutes of face-to-face and non-face-to-face time, excluding clinical staff time, preparing to see patient, ordering tests and/or medications, and provide counseling the patient          Follow-up: Return in about 2 months (around 01/05/2022).  Jabier Mutton, [facility] 11/05/2021, 9:09 AM

## 2021-11-05 NOTE — Patient Instructions (Addendum)
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 This needs 3 weekly shots of penicillin  Will give you the first shot today, please let the nurse/front desk know to schedule you for 2 more weekly visits with nurse to compete treatment   In around 2 months (the week before christmas), please come to lab here to test for hepatitis and repeat hiv/syphilis testing   If you still have vaginal discharge/lower abdominal pain in 1-2 weeks, we can repeat testing for the BV, and the trichomonas (just schedule with a provider at that time)   ---- Other things to consider Risk reduction of hiv -- you can take a daily pill truvada to reduce getting hiv if you think you are at high risk  Risk reduction of syphilis/gonorrhea-chlamydia - you can also consider taking doxycycline 200 mg within 2 hours of exposure  --- You can contact medical record and request restriction to your record (so password protected)

## 2021-11-12 ENCOUNTER — Other Ambulatory Visit: Payer: Self-pay

## 2021-11-12 ENCOUNTER — Telehealth: Payer: Self-pay

## 2021-11-12 ENCOUNTER — Ambulatory Visit (INDEPENDENT_AMBULATORY_CARE_PROVIDER_SITE_OTHER): Payer: Medicare (Managed Care)

## 2021-11-12 DIAGNOSIS — A528 Late syphilis, latent: Secondary | ICD-10-CM | POA: Diagnosis not present

## 2021-11-12 MED ORDER — PENICILLIN G BENZATHINE 1200000 UNIT/2ML IM SUSY
1.2000 10*6.[IU] | PREFILLED_SYRINGE | Freq: Once | INTRAMUSCULAR | Status: AC
  Administered 2021-11-12: 1.2 10*6.[IU] via INTRAMUSCULAR

## 2021-11-12 MED ORDER — DOXYCYCLINE HYCLATE 100 MG PO TABS: 100.0000 mg | ORAL_TABLET | Freq: Two times a day (BID) | ORAL | 0 refills | Status: DC

## 2021-11-12 NOTE — Telephone Encounter (Signed)
Patient requesting oral abx after rcvd 2 doses of Bicillin due to travel plans. Dr Gale Journey has agreed to Doxy 100 mg BID x 28 days. Patient aware of plan and will take for 28 days. RX sent to [facility]. Patient will begin taking tomorrow.

## 2021-11-19 ENCOUNTER — Ambulatory Visit: Payer: Medicare (Managed Care)

## 2021-11-23 ENCOUNTER — Ambulatory Visit: Payer: Medicare (Managed Care)

## 2021-11-29 NOTE — Therapy (Signed)
OUTPATIENT PHYSICAL THERAPY LOWER EXTREMITY EVALUATION   Patient Name: Maddisyn Hegwood MRN: [id number] DOB:March 03, 1965, 56 y.o., female Today's Date: 11/30/2021   PT End of Session - 11/30/21 0846     Visit Number 1    Number of Visits 9    Date for PT Re-Evaluation 02/01/22    Authorization Type [employer]age    Authorization Time Period FOTO v6, v10, kx mod v15    Progress Note Due on Visit 10    PT Start Time 0810    PT Stop Time 0855    PT Time Calculation (min) 45 min    Activity Tolerance Patient tolerated treatment well;Patient limited by pain    Behavior During Therapy WFL for tasks assessed/performed             Past Medical History:  Diagnosis Date   Anxiety    Arthritis of left knee    Bipolar disorder ([place])    Blood transfusion 1981   "related to my daughter being born"   Depression    Diabetes mellitus without complication ([place])    Fibroid tumor    Gout    Hypertension    Plantar fasciitis    Substance abuse ([place])    crack cocaine  14 yrs sober.   Past Surgical History:  Procedure Laterality Date   ABDOMINAL HYSTERECTOMY     "for fibroid tumors"   Patient Active Problem List   Diagnosis Date Noted   Bipolar 1 disorder ([place]) 10/02/2021   Generalized anxiety disorder 10/02/2021   HTN (hypertension) 03/28/2020   Chronic bilateral low back pain without sciatica 12/28/2019   Hyperlipidemia associated with type 2 diabetes mellitus ([place]) 12/22/2019   Chronic pain of left knee 12/21/2019   Type 2 diabetes mellitus with hyperglycemia, without long-term current use of insulin ([place]) 12/21/2019    PCP: Elsie Stain, MD   REFERRING PROVIDER: Sandi Mariscal, MD   REFERRING DIAG: [phone] (ICD-10-CM) - Primary osteoarthritis, unspecified site   THERAPY DIAG:  Chronic pain of left knee  Muscle weakness  Difficulty in walking, not elsewhere classified  Chronic bilateral low back pain without sciatica  Rationale for Evaluation and  Treatment: Rehabilitation  ONSET DATE: Several years ago  SUBJECTIVE:   SUBJECTIVE STATEMENT: Pt reports primary c/o chronic Lt knee pain of insidious onset lasting several years. Pt also reports secondary c/o chronic LBP lasting several years. She reports occasional Lt lower leg numbness from the knee to the front of the ankle, which is worse when she doesn't take her pain medication. Pt denies any popping/ clicking, buckling, or locking of the Lt knee. Current pain is 8/10. Worst pain is 9/10. Best pain is 0/10. Aggravating factors include walking/ standing >15 minutes, squatting, walking up stairs. Easing factors include pain medications, elevating leg. Pt denies any unexplained weight change, saddle anesthesia, nausea/ vomiting, or unrelenting night pain.   PERTINENT HISTORY: HTN, DMII, anxiety, depression, Bipolar disorder, gout PAIN:  Are you having pain? Yes: NPRS scale: 8/10 Pain location: Lt knee, midline low back Pain description: Achy, sharp Aggravating factors: walking/ standing >15 minutes, squatting, walking up stairs Relieving factors: pain medications, elevating leg  PRECAUTIONS: None  WEIGHT BEARING RESTRICTIONS: No  FALLS:  Has patient fallen in last 6 months? No  LIVING ENVIRONMENT: Lives with: lives with their family Lives in: House/apartment Stairs: Yes: External: 3 steps; none Has following equipment at home: None  OCCUPATION: On disability  PLOF: Independent  PATIENT GOALS: Return to exercise, ADLs  NEXT  MD VISIT:   Screening for Suicide  Answer the following questions with Yes or No and place an "x" beside the action taken.  1. Over the past two weeks, have you felt down, depressed, or hopeless?   No  2. Within the past two weeks, have you felt little interest or pleasure in life?  No  If YES to either #1 or #2, then ask #3  3. Have you had thoughts that that life is not worth living or that you might be       better off dead?     If answer  is NO and suspicion is low, then end   4. Over this past week, have you had any thoughts about hurting or even killing yourself?    If NO, then end. Patient in no immediate danger   5. If so, do you believe that you intend to or will harm yourself?       If NO, then end. Patient in no immediate danger   6.  Do you have a plan as to how you would hurt yourself?     7.  Over this past week, have you actually done anything to hurt yourself?    IF YES answers to either #4, #5, #6 or #7, then patient is AT RISK for suicide   Actions Taken  __X__  Screening negative; no further action required  ____  Screening positive; no immediate danger and patient already in treatment with a  mental health provider. Advise patient to speak to their mental health provider.  ____  Screening positive; no immediate danger. Patient advised to contact a mental  health provider for further assessment.   ____  Screening positive; in immediate danger as patient states intention of killing self,  has plan and a sense of imminence. Do not leave alone. Seek permission from  patient to contact a family member to inform them. Direct patient to go to ED.   OBJECTIVE:   DIAGNOSTIC FINDINGS: 11/29/2021: XR Knee 3 View Left: Moderately severe tricompartmental DJD.  Periarticular spurring.   PATIENT SURVEYS:  FOTO 60%, predicted 60% in 10 visits  COGNITION: Overall cognitive status: Within functional limits for tasks assessed     SENSATION: Light touch: WFL   MUSCLE LENGTH: Hamstring 90/90 Test: WNL BIL Thomas test: Moderate limitation BIL Ely's test: Moderate limitation BIL   PALPATION: TTP to Lt MPFL and proximal patellar tendon  PASSIVE ACCESSORIES: Hypomobile and painful CPAs T10-L4 Lt medial and lateral patellar glides painful and hypermobile   LOWER EXTREMITY ROM:  A/PROM Right eval Left eval  Knee flexion 128/130 125/130  Knee extension 0/3 0/3   (Blank rows = not  tested)  LOWER EXTREMITY MMT:  MMT Right eval Left eval  Hip flexion 3/5 3/5  Hip extension 3/5 3/5  Hip abduction 3+/5 3+/5  Knee flexion 5/5 5/5  Knee extension 5/5 5/5   (Blank rows = not tested)   LUMBAR ROM:   AROM AROM  11/30/2021  Flexion WNL  Extension WNL  Right lateral flexion WNL  Left lateral flexion WNL  Right rotation WNL  Left rotation WNL   (Blank rows = not tested)   SPECIAL TESTS:  Slump: (-) on Lt SLR: (-) on Lt Patellar apprehension: (-) on Lt Lateral patellar pull: (-) on Lt Patellar compression: (+) on Lt  FUNCTIONAL TESTS:  5xSTS: 18.5 seconds Squat: 75% Lunge: Rt: painful, Lt minimal pain Forearm plank: 10 seconds  GAIT: Distance walked: 1f Assistive device  utilized: None Level of assistance: Complete Independence Comments: Lt antalgic gait   TODAY'S TREATMENT:                                                                                                                               [facility] Adult PT Treatment:                                                DATE: 11/29/2021 Therapeutic Exercise: Pt education regarding POC, prognosis, probable underlying pathophysiology, and FOTO Manual Therapy: N/A Neuromuscular re-ed: N/A Therapeutic Activity: N/A Modalities: N/A Self Care: N/A    PATIENT EDUCATION:  Education details: Pt education regarding POC, prognosis, probable underlying pathophysiology, and FOTO Person educated: Patient Education method: Explanation, Demonstration, and Handouts Education comprehension: verbalized understanding and returned demonstration  HOME EXERCISE PROGRAM: MedBridge down at eval, administer HEP at first visit.  ASSESSMENT:  CLINICAL IMPRESSION: Patient is a 56 y.o. F who was seen today for physical therapy evaluation and treatment for chronic Lt knee pain and LBP.  Upon assessment, her primary impairments include limited squat, tight BIL hip flexors and quads, weak BIL global hips, weak  functional core strength, hypomobile and painful thoracolumbar passive accessory mobility, hypermobile and painful Lt patellar medial and lateral glides, and TTP to Lt MPFL and patellar tendon. Ruling up Lt patellar tendinitis due to TTP to patellar tendo and pain with stairs. Cannot rule out MPFL pathology due to TTP to this structure. Also ruling up non-specific mechanical LBP with associated core weakness. Ruling down lumbar radiculopathy. Pt will benefit from skilled PT to address her primary impairments and return to her prior level of function with less limitation.  OBJECTIVE IMPAIRMENTS: Abnormal gait, decreased balance, decreased endurance, decreased mobility, difficulty walking, decreased ROM, decreased strength, dizziness, hypomobility, increased edema, impaired flexibility, improper body mechanics, postural dysfunction, and pain.   ACTIVITY LIMITATIONS: carrying, lifting, bending, sitting, standing, squatting, stairs, transfers, dressing, and locomotion level  PARTICIPATION LIMITATIONS: meal prep, cleaning, laundry, driving, shopping, community activity, and yard work  PERSONAL FACTORS: Time since onset of injury/illness/exacerbation, 3+ comorbidities: See medical hx, and multiple treatment areas  are also affecting patient's functional outcome.   REHAB POTENTIAL: Fair Due to personal factors listed above  CLINICAL DECISION MAKING: Evolving/moderate complexity  EVALUATION COMPLEXITY: Moderate   GOALS: Goals reviewed with patient? No  SHORT TERM GOALS: Target date: 12/28/2021  Pt will report understanding and adherence to initial HEP in order to promote independence in the management of primary impairments. Baseline: Administer HEP at first treatment Goal status: INITIAL   LONG TERM GOALS: Target date: 01/25/2022   Pt will demonstrate ability to ascend/ descend 30 stairs with step-through pattern and no UE support in order to negotiate two flights of stairs in community settings.   Baseline: Pt reports significant pain and  need for use of UE support with stairs Goal status: INITIAL  2.  Pt will achieve BIL global hip strength of 4+/5 in order to progress her independent LE strengthening regimen with less limitation. Baseline: See MMT chart Goal status: INITIAL  3.  Pt will achieve a forearm plank of 40 seconds or more in order to demonstrate improved functional core strength in the prophylaxis of future mechanical LBP. Baseline: 10 seconds Goal status: INITIAL  4.  Pt will report ability to walk/ stand >30 minutes with 0-5/10 pain in order to go grocery shopping with less limitation. Baseline: >8/10 pain after 15 minutes of standing/ walking Goal status: INITIAL    PLAN:  PT FREQUENCY: 1x/week  PT DURATION: 8 weeks  PLANNED INTERVENTIONS: Therapeutic exercises, Therapeutic activity, Neuromuscular re-education, Balance training, Gait training, Patient/Family education, Self Care, Joint mobilization, Stair training, Orthotic/Fit training, Aquatic Therapy, Dry Needling, Electrical stimulation, Spinal mobilization, Cryotherapy, Moist heat, Manual lymph drainage, Taping, Vasopneumatic device, Biofeedback, Ionotophoresis '4mg'$ /ml Dexamethasone, Manual therapy, and Re-evaluation  PLAN FOR NEXT SESSION: *Administer HEP*, progress early quad/ hip strengthening, core strengthening   Vanessa Albion, PT, DPT 11/30/21 9:05 AM

## 2021-11-30 ENCOUNTER — Other Ambulatory Visit: Payer: Self-pay

## 2021-11-30 ENCOUNTER — Ambulatory Visit: Payer: Medicare (Managed Care) | Attending: Internal Medicine

## 2021-11-30 DIAGNOSIS — R262 Difficulty in walking, not elsewhere classified: Secondary | ICD-10-CM | POA: Insufficient documentation

## 2021-11-30 DIAGNOSIS — G8929 Other chronic pain: Secondary | ICD-10-CM | POA: Diagnosis present

## 2021-11-30 DIAGNOSIS — M6281 Muscle weakness (generalized): Secondary | ICD-10-CM | POA: Diagnosis present

## 2021-11-30 DIAGNOSIS — M545 Low back pain, unspecified: Secondary | ICD-10-CM | POA: Insufficient documentation

## 2021-11-30 DIAGNOSIS — M25562 Pain in left knee: Secondary | ICD-10-CM | POA: Insufficient documentation

## 2021-12-07 ENCOUNTER — Encounter: Payer: Medicare (Managed Care) | Admitting: Physical Therapy

## 2021-12-10 ENCOUNTER — Encounter: Payer: Self-pay | Admitting: Physical Therapy

## 2021-12-10 ENCOUNTER — Ambulatory Visit: Payer: Medicare (Managed Care) | Admitting: Physical Therapy

## 2021-12-10 DIAGNOSIS — G8929 Other chronic pain: Secondary | ICD-10-CM

## 2021-12-10 DIAGNOSIS — M6281 Muscle weakness (generalized): Secondary | ICD-10-CM

## 2021-12-10 DIAGNOSIS — M25562 Pain in left knee: Secondary | ICD-10-CM | POA: Diagnosis not present

## 2021-12-10 DIAGNOSIS — R262 Difficulty in walking, not elsewhere classified: Secondary | ICD-10-CM

## 2021-12-10 NOTE — Therapy (Signed)
OUTPATIENT PHYSICAL THERAPY TREATMENT NOTE   Patient Name: Sydney Harris MRN: [id number] DOB:29-May-1965, 56 y.o., female Today's Date: 12/10/2021  PCP: Sydney Stain, MD     REFERRING PROVIDER: Sandi Mariscal, MD   PT End of Session - 12/10/21 1014     Visit Number 2    Number of Visits 9    Date for PT Re-Evaluation 02/01/22    Authorization Type [employer]vantage    Authorization Time Period FOTO v6, v10, kx mod v15    Progress Note Due on Visit 10    PT Start Time 1015    PT Stop Time 1056    PT Time Calculation (min) 41 min    Activity Tolerance Patient tolerated treatment well;Patient limited by pain    Behavior During Therapy WFL for tasks assessed/performed             Past Medical History:  Diagnosis Date   Anxiety    Arthritis of left knee    Bipolar disorder ([place])    Blood transfusion 1981   "related to my daughter being born"   Depression    Diabetes mellitus without complication ([place])    Fibroid tumor    Gout    Hypertension    Plantar fasciitis    Substance abuse ([place])    crack cocaine  14 yrs sober.   Past Surgical History:  Procedure Laterality Date   ABDOMINAL HYSTERECTOMY     "for fibroid tumors"   Patient Active Problem List   Diagnosis Date Noted   Bipolar 1 disorder ([place]) 10/02/2021   Generalized anxiety disorder 10/02/2021   HTN (hypertension) 03/28/2020   Chronic bilateral low back pain without sciatica 12/28/2019   Hyperlipidemia associated with type 2 diabetes mellitus ([place]) 12/22/2019   Chronic pain of left knee 12/21/2019   Type 2 diabetes mellitus with hyperglycemia, without long-term current use of insulin ([place]) 12/21/2019    THERAPY DIAG:  Chronic pain of left knee  Muscle weakness  Difficulty in walking, not elsewhere classified  Chronic bilateral low back pain without sciatica   Rationale for Evaluation and Treatment Rehabilitation  REFERRING DIAG: M19.91 (ICD-10-CM) - Primary osteoarthritis,  unspecified site    PERTINENT HISTORY: HTN, DMII, anxiety, depression, Bipolar disorder, gout   PRECAUTIONS/RESTRICTIONS:   none  SUBJECTIVE:  Pt reports that her knee is doing pretty well today.  She has been working on her exercises at home.  PAIN:  Are you having pain? Yes: NPRS scale: 7/10 Pain location: Lt knee, midline low back Pain description: Achy, sharp Aggravating factors: walking/ standing >15 minutes, squatting, walking up stairs Relieving factors: pain medications, elevating leg  OBJECTIVE: (objective measures completed at initial evaluation unless otherwise dated)  DIAGNOSTIC FINDINGS: 11/29/2021: XR Knee 3 View Left: Moderately severe tricompartmental DJD.  Periarticular spurring.    PATIENT SURVEYS:  FOTO 60%, predicted 60% in 10 visits   COGNITION: Overall cognitive status: Within functional limits for tasks assessed                         SENSATION: Light touch: WFL     MUSCLE LENGTH: Hamstring 90/90 Test: WNL BIL Thomas test: Moderate limitation BIL Ely's test: Moderate limitation BIL     PALPATION: TTP to Lt MPFL and proximal patellar tendon   PASSIVE ACCESSORIES: Hypomobile and painful CPAs T10-L4 Lt medial and lateral patellar glides painful and hypermobile    LOWER EXTREMITY ROM:   A/PROM Right eval Left eval  Knee flexion 128/130 125/130  Knee extension 0/3 0/3   (Blank rows = not tested)   LOWER EXTREMITY MMT:   MMT Right eval Left eval  Hip flexion 3/5 3/5  Hip extension 3/5 3/5  Hip abduction 3+/5 3+/5  Knee flexion 5/5 5/5  Knee extension 5/5 5/5   (Blank rows = not tested)     LUMBAR ROM:    AROM AROM  11/30/2021  Flexion WNL  Extension WNL  Right lateral flexion WNL  Left lateral flexion WNL  Right rotation WNL  Left rotation WNL   (Blank rows = not tested)     SPECIAL TESTS:  Slump: (-) on Lt SLR: (-) on Lt Patellar apprehension: (-) on Lt Lateral patellar pull: (-) on Lt Patellar compression: (+) on  Lt   FUNCTIONAL TESTS:  5xSTS: 18.5 seconds Squat: 75% Lunge: Rt: painful, Lt minimal pain Forearm plank: 10 seconds   GAIT: Distance walked: 77f Assistive device utilized: None Level of assistance: Complete Independence Comments: Lt antalgic gait     TODAY'S TREATMENT:                                                                                                                               TREATMENT 11/21:  Therapeutic Exercise: - bike - 5 min L2 - for warm up - quad set with towel - 5'' - 2x10 - SLR - 3x10 - S/L hip abduction - 3x10 - knee ext machine - 15# - 3x10 ea - HS curl - 3x10 - TKE (d/c d/t pain, pressure from band) - retro TM walking - 5 min with frequent breaks  ASSESSMENT:   CLINICAL IMPRESSION: [name] tolerated session well with no adverse reaction.  We concentrated on quad (specific) and general LE strengthening to good effect with minimal increase in pain.  Cued for pacing throughout.  Continue per POC.   OBJECTIVE IMPAIRMENTS: Abnormal gait, decreased balance, decreased endurance, decreased mobility, difficulty walking, decreased ROM, decreased strength, dizziness, hypomobility, increased edema, impaired flexibility, improper body mechanics, postural dysfunction, and pain.    ACTIVITY LIMITATIONS: carrying, lifting, bending, sitting, standing, squatting, stairs, transfers, dressing, and locomotion level   PARTICIPATION LIMITATIONS: meal prep, cleaning, laundry, driving, shopping, community activity, and yard work   PERSONAL FACTORS: Time since onset of injury/illness/exacerbation, 3+ comorbidities: See medical hx, and multiple treatment areas  are also affecting patient's functional outcome.    REHAB POTENTIAL: Fair Due to personal factors listed above   CLINICAL DECISION MAKING: Evolving/moderate complexity   EVALUATION COMPLEXITY: Moderate     GOALS: Goals reviewed with patient? No   SHORT TERM GOALS: Target date: 12/28/2021  Pt will report  understanding and adherence to initial HEP in order to promote independence in the management of primary impairments. Baseline: Administer HEP at first treatment Goal status: INITIAL     LONG TERM GOALS: Target date: 01/25/2022    Pt will demonstrate ability to ascend/ descend 30 stairs with step-through pattern  and no UE support in order to negotiate two flights of stairs in community settings.  Baseline: Pt reports significant pain and need for use of UE support with stairs Goal status: INITIAL   2.  Pt will achieve BIL global hip strength of 4+/5 in order to progress her independent LE strengthening regimen with less limitation. Baseline: See MMT chart Goal status: INITIAL   3.  Pt will achieve a forearm plank of 40 seconds or more in order to demonstrate improved functional core strength in the prophylaxis of future mechanical LBP. Baseline: 10 seconds Goal status: INITIAL   4.  Pt will report ability to walk/ stand >30 minutes with 0-5/10 pain in order to go grocery shopping with less limitation. Baseline: >8/10 pain after 15 minutes of standing/ walking Goal status: INITIAL       PLAN:   PT FREQUENCY: 1x/week   PT DURATION: 8 weeks   PLANNED INTERVENTIONS: Therapeutic exercises, Therapeutic activity, Neuromuscular re-education, Balance training, Gait training, Patient/Family education, Self Care, Joint mobilization, Stair training, Orthotic/Fit training, Aquatic Therapy, Dry Needling, Electrical stimulation, Spinal mobilization, Cryotherapy, Moist heat, Manual lymph drainage, Taping, Vasopneumatic device, Biofeedback, Ionotophoresis '4mg'$ /ml Dexamethasone, Manual therapy, and Re-evaluation   PLAN FOR NEXT SESSION: *Administer HEP*, progress early quad/ hip strengthening, core strengthening   Kevan Ny Zakira Ressel PT 12/10/2021, 11:05 AM

## 2021-12-20 NOTE — Therapy (Addendum)
PHYSICAL THERAPY UNPLANNED DISCHARGE SUMMARY   Visits from Start of Care: 3  Current functional level related to goals / functional outcomes: Current status unknown   Remaining deficits: Current status unknown   Education / Equipment: Pt has not returned since visit listed below  Patient goals were not assessed. Patient is being discharged due to not returning since the last visit.  (the below note was addended to include the above D/C summary on 03/05/22)  Patient Name: Sydney Harris MRN: [id number] DOB:[birth year], 56 y.o., female Today's Date: 12/21/2021  PCP: Elsie Stain, MD     REFERRING PROVIDER: Sandi Mariscal, MD   PT End of Session - 12/21/21 0806     Visit Number 3    Number of Visits 9    Date for PT Re-Evaluation 02/01/22    Authorization Type [employer]vantage    Authorization Time Period FOTO v6, v10, kx mod v15    Progress Note Due on Visit 10    PT Start Time 0810    PT Stop Time 0855    PT Time Calculation (min) 45 min    Activity Tolerance Patient tolerated treatment well;Patient limited by pain    Behavior During Therapy [facility] for tasks assessed/performed              Past Medical History:  Diagnosis Date   Anxiety    Arthritis of left knee    Bipolar disorder ([place])    Blood transfusion 1981   "related to my daughter being born"   Depression    Diabetes mellitus without complication ([place])    Fibroid tumor    Gout    Hypertension    Plantar fasciitis    Substance abuse ([place])    crack cocaine  14 yrs sober.   Past Surgical History:  Procedure Laterality Date   ABDOMINAL HYSTERECTOMY     "for fibroid tumors"   Patient Active Problem List   Diagnosis Date Noted   Bipolar 1 disorder ([place]) 10/02/2021   Generalized anxiety disorder 10/02/2021   HTN (hypertension) 03/28/2020   Chronic bilateral low back pain without sciatica 12/28/2019   Hyperlipidemia associated with type 2 diabetes mellitus ([place]) 12/22/2019    Chronic pain of left knee 12/21/2019   Type 2 diabetes mellitus with hyperglycemia, without long-term current use of insulin ([place]) 12/21/2019    THERAPY DIAG:  Chronic pain of left knee  Muscle weakness  Difficulty in walking, not elsewhere classified  Chronic bilateral low back pain without sciatica  Other abnormalities of gait and mobility   Rationale for Evaluation and Treatment Rehabilitation  REFERRING DIAG: M19.91 (ICD-10-CM) - Primary osteoarthritis, unspecified site    PERTINENT HISTORY: HTN, DMII, anxiety, depression, Bipolar disorder, gout   PRECAUTIONS/RESTRICTIONS:   none  SUBJECTIVE:  Patient reports pain in her lower back today stating the knee is feeling better. Reports HEP compliance.   PAIN:  Are you having pain? Yes: NPRS scale: 7/10 Pain location: Lt knee, midline low back Pain description: Achy, sharp Aggravating factors: walking/ standing >15 minutes, squatting, walking up stairs Relieving factors: pain medications, elevating leg  OBJECTIVE: (objective measures completed at initial evaluation unless otherwise dated)  DIAGNOSTIC FINDINGS: 11/29/2021: XR Knee 3 View Left: Moderately severe tricompartmental DJD.  Periarticular spurring.    PATIENT SURVEYS:  FOTO 60%, predicted 60% in 10 visits   COGNITION: Overall cognitive status: Within functional limits for tasks assessed  SENSATION: Light touch: WFL     MUSCLE LENGTH: Hamstring 90/90 Test: WNL BIL Thomas test: Moderate limitation BIL Ely's test: Moderate limitation BIL     PALPATION: TTP to Lt MPFL and proximal patellar tendon   PASSIVE ACCESSORIES: Hypomobile and painful CPAs T10-L4 Lt medial and lateral patellar glides painful and hypermobile    LOWER EXTREMITY ROM:   A/PROM Right eval Left eval  Knee flexion 128/130 125/130  Knee extension 0/3 0/3   (Blank rows = not tested)   LOWER EXTREMITY MMT:   MMT Right eval Left eval  Hip flexion 3/5  3/5  Hip extension 3/5 3/5  Hip abduction 3+/5 3+/5  Knee flexion 5/5 5/5  Knee extension 5/5 5/5   (Blank rows = not tested)     LUMBAR ROM:    AROM AROM  11/30/2021  Flexion WNL  Extension WNL  Right lateral flexion WNL  Left lateral flexion WNL  Right rotation WNL  Left rotation WNL   (Blank rows = not tested)     SPECIAL TESTS:  Slump: (-) on Lt SLR: (-) on Lt Patellar apprehension: (-) on Lt Lateral patellar pull: (-) on Lt Patellar compression: (+) on Lt   FUNCTIONAL TESTS:  5xSTS: 18.5 seconds Squat: 75% Lunge: Rt: painful, Lt minimal pain Forearm plank: 10 seconds   GAIT: Distance walked: 43f Assistive device utilized: None Level of assistance: Complete Independence Comments: Lt antalgic gait   Access Code: [email or web address]: [email or web address] Date: 12/21/2021 Prepared by: SMargarette [place] Exercises - Supine Quad Set  - 1 x daily - 7 x weekly - 3 sets - 10 reps - 5 hold - Supine Active Straight Leg Raise  - 1 x daily - 7 x weekly - 3 sets - 10 reps - Sidelying Hip Abduction  - 1 x daily - 7 x weekly - 3 sets - 10 reps   TODAY'S TREATMENT:            TREATMENT 12/2: Therapeutic Exercise: - bike - 5 min L2 - for warm up - step ups 6" Lt leading forward/lateral 2x10 each - quad set with towel - 5'' - 2x10 - SLR - 2x10 BIL - S/L hip abduction - 2x10 BIL - knee ext machine - 15# - 3x10 ea - HS curl - 3x10 25# - TKE with ball on wall 5" hold 2x10 - bridges 3x10 - LTR x10 BIL - sidelying open books x10 BIL  TREATMENT 11/21:  Therapeutic Exercise: - bike - 5 min L2 - for warm up - quad set with towel - 5'' - 2x10 - SLR - 3x10 - S/L hip abduction - 3x10 - knee ext machine - 15# - 3x10 ea - HS curl - 3x10 - TKE (d/c d/t pain, pressure from band) - retro TM walking - 5 min with frequent breaks  ASSESSMENT:   CLINICAL IMPRESSION: Patient presents to PT reporting improved Lt knee pain but continued lower back pain. Session  today focused on LE strengthening with particular focus on quadriceps as well as lumbar mobility. Updated and reprinted HEP with patient demonstrating understanding of each exercise. Patient was able to tolerate all prescribed exercises with no adverse effects. Patient continues to benefit from skilled PT services and should be progressed as able to improve functional independence.     OBJECTIVE IMPAIRMENTS: Abnormal gait, decreased balance, decreased endurance, decreased mobility, difficulty walking, decreased ROM, decreased strength, dizziness, hypomobility, increased edema, impaired flexibility, improper body mechanics, postural dysfunction, and pain.    ACTIVITY  LIMITATIONS: carrying, lifting, bending, sitting, standing, squatting, stairs, transfers, dressing, and locomotion level   PARTICIPATION LIMITATIONS: meal prep, cleaning, laundry, driving, shopping, community activity, and yard work   PERSONAL FACTORS: Time since onset of injury/illness/exacerbation, 3+ comorbidities: See medical hx, and multiple treatment areas  are also affecting patient's functional outcome.    REHAB POTENTIAL: Fair Due to personal factors listed above   CLINICAL DECISION MAKING: Evolving/moderate complexity   EVALUATION COMPLEXITY: Moderate     GOALS: Goals reviewed with patient? No   SHORT TERM GOALS: Target date: 12/28/2021  Pt will report understanding and adherence to initial HEP in order to promote independence in the management of primary impairments. Baseline: Administer HEP at first treatment Goal status: INITIAL     LONG TERM GOALS: Target date: 01/25/2022    Pt will demonstrate ability to ascend/ descend 30 stairs with step-through pattern and no UE support in order to negotiate two flights of stairs in community settings.  Baseline: Pt reports significant pain and need for use of UE support with stairs Goal status: INITIAL   2.  Pt will achieve BIL global hip strength of 4+/5 in order to  progress her independent LE strengthening regimen with less limitation. Baseline: See MMT chart Goal status: INITIAL   3.  Pt will achieve a forearm plank of 40 seconds or more in order to demonstrate improved functional core strength in the prophylaxis of future mechanical LBP. Baseline: 10 seconds Goal status: INITIAL   4.  Pt will report ability to walk/ stand >30 minutes with 0-5/10 pain in order to go grocery shopping with less limitation. Baseline: >8/10 pain after 15 minutes of standing/ walking Goal status: INITIAL       PLAN:   PT FREQUENCY: 1x/week   PT DURATION: 8 weeks   PLANNED INTERVENTIONS: Therapeutic exercises, Therapeutic activity, Neuromuscular re-education, Balance training, Gait training, Patient/Family education, Self Care, Joint mobilization, Stair training, Orthotic/Fit training, Aquatic Therapy, Dry Needling, Electrical stimulation, Spinal mobilization, Cryotherapy, Moist heat, Manual lymph drainage, Taping, Vasopneumatic device, Biofeedback, Ionotophoresis 2m/ml Dexamethasone, Manual therapy, and Re-evaluation   PLAN FOR NEXT SESSION: *Administer HEP*, progress early quad/ hip strengthening, core strengthening   SMargarette CanadaPTA 12/21/2021, 8:48 AM

## 2021-12-21 ENCOUNTER — Ambulatory Visit: Payer: Medicare (Managed Care) | Attending: Internal Medicine

## 2021-12-21 DIAGNOSIS — R262 Difficulty in walking, not elsewhere classified: Secondary | ICD-10-CM | POA: Insufficient documentation

## 2021-12-21 DIAGNOSIS — G8929 Other chronic pain: Secondary | ICD-10-CM | POA: Diagnosis present

## 2021-12-21 DIAGNOSIS — M25562 Pain in left knee: Secondary | ICD-10-CM | POA: Diagnosis present

## 2021-12-21 DIAGNOSIS — M545 Low back pain, unspecified: Secondary | ICD-10-CM | POA: Diagnosis present

## 2021-12-21 DIAGNOSIS — M6281 Muscle weakness (generalized): Secondary | ICD-10-CM | POA: Diagnosis present

## 2021-12-21 DIAGNOSIS — R2689 Other abnormalities of gait and mobility: Secondary | ICD-10-CM | POA: Diagnosis present

## 2021-12-28 ENCOUNTER — Encounter: Payer: Medicare (Managed Care) | Admitting: Physical Therapy

## 2021-12-31 ENCOUNTER — Ambulatory Visit: Payer: Medicare (Managed Care) | Admitting: Physical Therapy

## 2022-01-03 ENCOUNTER — Ambulatory Visit: Payer: Medicaid Other | Admitting: Internal Medicine

## 2022-01-06 ENCOUNTER — Other Ambulatory Visit: Payer: Self-pay | Admitting: Critical Care Medicine

## 2022-01-11 ENCOUNTER — Other Ambulatory Visit: Payer: Self-pay | Admitting: Critical Care Medicine

## 2022-01-26 ENCOUNTER — Other Ambulatory Visit: Payer: Self-pay

## 2022-01-26 ENCOUNTER — Ambulatory Visit (HOSPITAL_COMMUNITY)
Admission: EM | Admit: 2022-01-26 | Discharge: 2022-01-26 | Disposition: A | Discharge status: Home / Self Care | Payer: Medicare (Managed Care) | Attending: Internal Medicine | Admitting: Internal Medicine

## 2022-01-26 ENCOUNTER — Encounter (HOSPITAL_COMMUNITY): Payer: Self-pay | Admitting: *Deleted

## 2022-01-26 DIAGNOSIS — Z1152 Encounter for screening for COVID-19: Secondary | ICD-10-CM | POA: Diagnosis not present

## 2022-01-26 DIAGNOSIS — R52 Pain, unspecified: Secondary | ICD-10-CM | POA: Diagnosis present

## 2022-01-26 DIAGNOSIS — J111 Influenza due to unidentified influenza virus with other respiratory manifestations: Secondary | ICD-10-CM | POA: Diagnosis present

## 2022-01-26 MED ORDER — KETOROLAC TROMETHAMINE 30 MG/ML IJ SOLN
30.0000 mg | Freq: Once | INTRAMUSCULAR | Status: AC
  Administered 2022-01-26: 30 mg via INTRAMUSCULAR

## 2022-01-26 MED ORDER — BENZONATATE 100 MG PO CAPS: 100.0000 mg | ORAL_CAPSULE | Freq: Three times a day (TID) | ORAL | 0 refills | Status: DC

## 2022-01-26 MED ORDER — ACETAMINOPHEN 325 MG PO TABS
650.0000 mg | ORAL_TABLET | Freq: Once | ORAL | Status: AC
  Administered 2022-01-26: 650 mg via ORAL

## 2022-01-26 MED ORDER — KETOROLAC TROMETHAMINE 30 MG/ML IJ SOLN
INTRAMUSCULAR | Status: AC
  Filled 2022-01-26: qty 1

## 2022-01-26 MED ORDER — ACETAMINOPHEN 325 MG PO TABS
ORAL_TABLET | ORAL | Status: AC
  Filled 2022-01-26: qty 2

## 2022-01-26 NOTE — ED Provider Notes (Signed)
[facility]    CSN: [id number] Arrival date & time: 01/26/22  1011      History   Chief Complaint Chief Complaint  Patient presents with   Fever   Generalized Body Aches    HPI Sydney Harris is a 57 y.o. female.   Patient presents urgent care for evaluation of bodyaches, fever/chills, cough, nasal congestion, sore throat, and generalized fatigue for the last 3 days.  No known sick contacts with similar symptoms.  Denies history of chronic respiratory problems.  She is a former cigarette smoker, denies other drug use.  Cough is dry and worse at nighttime.  Her body aches are bothering her the most.  She has not attempted use of any over-the-counter medications to help with symptoms prior to arrival urgent care.  She is tolerating food and fluids well without nausea, vomiting, abdominal pain, diarrhea, or dizziness.  Denies chest pain and shortness of breath/heart palpitations.  No known documented highest temp at home due to lack of thermometer.  History of diabetes, states her blood sugars have been well-controlled over the last few days while sick in the 100-200 range.  She has been taking all of her other medications as prescribed while ill.  She is vaccinated against COVID-19 and influenza.  Denies headache, blurry vision, urinary symptoms, dizziness, and lightheadedness.  Patient is requesting stronger pain medicine to help with bodyaches.   Fever   Past Medical History:  Diagnosis Date   Anxiety    Arthritis of left knee    Bipolar disorder ([place])    Blood transfusion 1981   "related to my daughter being born"   Depression    Diabetes mellitus without complication ([place])    Fibroid tumor    Gout    Hypertension    Plantar fasciitis    Substance abuse ([place])    crack cocaine  14 yrs sober.    Patient Active Problem List   Diagnosis Date Noted   Bipolar 1 disorder ([place]) 10/02/2021   Generalized anxiety disorder 10/02/2021   HTN (hypertension)  03/28/2020   Chronic bilateral low back pain without sciatica 12/28/2019   Hyperlipidemia associated with type 2 diabetes mellitus ([place]) 12/22/2019   Chronic pain of left knee 12/21/2019   Type 2 diabetes mellitus with hyperglycemia, without long-term current use of insulin ([place]) 12/21/2019    Past Surgical History:  Procedure Laterality Date   ABDOMINAL HYSTERECTOMY     "for fibroid tumors"    OB History   No obstetric history on file.      Home Medications    Prior to Admission medications   Medication Sig Start Date End Date Taking? Authorizing Provider  ARIPiprazole (ABILIFY) 15 MG tablet Take 1 tablet (15 mg total) by mouth at bedtime. 10/02/21  Yes Elsie Stain, MD  atorvastatin (LIPITOR) 10 MG tablet Take 1 tablet (10 mg total) by mouth daily. 10/02/21  Yes Elsie Stain, MD  baclofen (LIORESAL) 10 MG tablet Take 1 tablet (10 mg total) by mouth 3 (three) times daily. 12/25/20  Yes Elsie Stain, MD  benzonatate (TESSALON) 100 MG capsule Take 1 capsule (100 mg total) by mouth every 8 (eight) hours. 01/26/22  Yes Talbot Grumbling, FNP  benztropine (COGENTIN) 0.5 MG tablet Take 1 tablet (0.5 mg total) by mouth at bedtime. 10/02/21  Yes Elsie Stain, MD  cloNIDine (CATAPRES) 0.1 MG tablet Take 0.1 mg by mouth 2 (two) times daily. 10/28/21  Yes [provider]  escitalopram (  LEXAPRO) 5 MG tablet Take 1 tablet (5 mg total) by mouth daily. 10/02/21  Yes Elsie Stain, MD  gabapentin (NEURONTIN) 300 MG capsule Take 300 mg by mouth 3 (three) times daily. 10/23/21  Yes [provider]  losartan-hydrochlorothiazide Konrad Penta) 50-12.5 MG tablet Take 1 tablet by mouth once daily 01/06/22  Yes Elsie Stain, MD  metFORMIN (GLUCOPHAGE) 500 MG tablet Take 1 tablet (500 mg total) by mouth daily with breakfast. 10/02/21  Yes Elsie Stain, MD  Multiple Vitamin (MULTIVITAMIN ADULT PO) Take by mouth daily.   Yes [provider]  Multiple  Vitamins-Minerals (HAIR SKIN AND NAILS FORMULA PO) Take by mouth daily.   Yes [provider]  oxyCODONE-acetaminophen (PERCOCET) 10-325 MG tablet Take 1 tablet by mouth 4 (four) times daily as needed. 10/24/20  Yes [provider]  oxyCODONE-acetaminophen (PERCOCET) 7.5-325 MG tablet Take 1 tablet by mouth 4 (four) times daily as needed. 10/29/21  Yes [provider]  prazosin (MINIPRESS) 1 MG capsule Take 1 capsule (1 mg total) by mouth at bedtime. 10/02/21  Yes Elsie Stain, MD  traZODone (DESYREL) 100 MG tablet TAKE 1 & 1/2 (ONE & ONE-HALF) TABLETS BY MOUTH AT BEDTIME AS NEEDED 01/14/22  Yes Elsie Stain, MD  Vitamin D, Ergocalciferol, (DRISDOL) 1.25 MG (50000 UNIT) CAPS capsule Take 1 capsule (50,000 Units total) by mouth once a week. 10/02/21  Yes Elsie Stain, MD  Accu-Chek Softclix Lancets lancets Use to measure blood sugar twice a day. E11.65 12/26/19   Elsie Stain, MD  Blood Glucose Monitoring Suppl (ACCU-CHEK GUIDE ME) w/Device KIT Use to measure blood sugar twice a day. E11.65 12/26/19   Elsie Stain, MD  doxycycline (VIBRA-TABS) 100 MG tablet Take 1 tablet (100 mg total) by mouth 2 (two) times daily. 11/12/21   Vu, Johnny Bridge T, MD  glucose blood (ACCU-CHEK GUIDE) test strip Use to measure blood sugar twice a day. E11.65 12/26/19   Elsie Stain, MD  metroNIDAZOLE (FLAGYL) 500 MG tablet Take 1 tablet (500 mg total) by mouth 2 (two) times daily. 10/31/21   Lamptey, Myrene Galas, MD  Misc. Devices (CANE) MISC 1 each by Does not apply route daily as needed. 06/11/21   Elsie Stain, MD  naloxone [facility]) nasal spray 4 mg/0.1 mL SMARTSIG:Both Nares 09/21/20   [provider]  omeprazole (PRILOSEC) 40 MG capsule Take 1 capsule (40 mg total) by mouth daily. 10/02/21   Elsie Stain, MD    Family History Family History  Problem Relation Age of Onset   Diabetes Father    Asthma Daughter    Heart failure Neg Hx    Cancer Neg Hx     Colon polyps Neg Hx    Colon cancer Neg Hx    Esophageal cancer Neg Hx    Rectal cancer Neg Hx    Stomach cancer Neg Hx     Social History Social History   Tobacco Use   Smoking status: Former    Packs/day: 0.50    Years: 33.00    Total pack years: 16.50    Types: Cigarettes    Quit date: 2018    Years since quitting: 6.0   Smokeless tobacco: Never  Vaping Use   Vaping Use: Never used  Substance Use Topics   Alcohol use: No   Drug use: Not Currently    Types: "Crack" cocaine, Marijuana    Comment: clean since 2015; currently chews CBD gummies  Allergies   Penicillins and Tomato   Review of Systems Review of Systems  Constitutional:  Positive for fever.  Per HPI   Physical Exam Triage Vital Signs ED Triage Vitals  Enc Vitals Group     BP 01/26/22 1045 110/77     Pulse Rate 01/26/22 1045 (!) 130     Resp 01/26/22 1045 (!) 22     Temp 01/26/22 1045 99.2 F (37.3 C)     Temp Source 01/26/22 1045 Oral     SpO2 01/26/22 1045 96 %     Weight --      Height --      Head Circumference --      Peak Flow --      Pain Score 01/26/22 1047 10     Pain Loc --      Pain Edu? --      Excl. in [place]? --    No data found.  Updated Vital Signs BP 110/77   Pulse (!) 130   Temp 99.2 F (37.3 C) (Oral)   Resp (!) 22   LMP 12/28/2010   SpO2 96%   Visual Acuity Right Eye Distance:   Left Eye Distance:   Bilateral Distance:    Right Eye Near:   Left Eye Near:    Bilateral Near:     Physical Exam Vitals and nursing note reviewed.  Constitutional:      Appearance: She is ill-appearing. She is not toxic-appearing.  HENT:     Head: Normocephalic and atraumatic.     Right Ear: Hearing, tympanic membrane, ear canal and external ear normal.     Left Ear: Hearing, tympanic membrane, ear canal and external ear normal.     Nose: Congestion present.     Mouth/Throat:     Lips: Pink.     Mouth: Mucous membranes are moist.     Pharynx: No posterior oropharyngeal  erythema.  Eyes:     General: Lids are normal. Vision grossly intact. Gaze aligned appropriately.        Right eye: No discharge.        Left eye: No discharge.     Extraocular Movements: Extraocular movements intact.     Conjunctiva/sclera: Conjunctivae normal.     Pupils: Pupils are equal, round, and reactive to light.  Cardiovascular:     Rate and Rhythm: Normal rate and regular rhythm.     Heart sounds: Normal heart sounds, S1 normal and S2 normal.  Pulmonary:     Effort: Pulmonary effort is normal. No respiratory distress.     Breath sounds: Normal breath sounds and air entry. No wheezing, rhonchi or rales.  Abdominal:     General: Abdomen is flat. Bowel sounds are normal.     Palpations: Abdomen is soft.     Tenderness: There is no abdominal tenderness. There is no right CVA tenderness, left CVA tenderness or guarding.  Musculoskeletal:     Cervical back: Neck supple.     Right lower leg: No edema.     Left lower leg: No edema.  Lymphadenopathy:     Cervical: No cervical adenopathy.  Skin:    General: Skin is warm and dry.     Capillary Refill: Capillary refill takes less than 2 seconds.     Findings: No rash.  Neurological:     General: No focal deficit present.     Mental Status: She is alert and oriented to person, place, and time. Mental status is at baseline.  Cranial Nerves: No dysarthria or facial asymmetry.  Psychiatric:        Mood and Affect: Mood normal.        Speech: Speech normal.        Behavior: Behavior normal.        Thought Content: Thought content normal.        Judgment: Judgment normal.      UC Treatments / Results  Labs (all labs ordered are listed, but only abnormal results are displayed) Labs Reviewed  SARS CORONAVIRUS 2 (TAT 6-24 HRS)    EKG   Radiology No results found.  Procedures Procedures (including critical care time)  Medications Ordered in UC Medications  acetaminophen (TYLENOL) tablet 650 mg (650 mg Oral Given  01/26/22 1056)  ketorolac (TORADOL) 30 MG/ML injection 30 mg (30 mg Intramuscular Given 01/26/22 1108)    Initial Impression / Assessment and Plan / UC Course  I have reviewed the triage vital signs and the nursing notes.  Pertinent labs & imaging results that were available during my care of the patient were reviewed by me and considered in my medical decision making (see chart for details).   1.  Influenza-like illness, body aches Symptoms and physical exam consistent with a viral upper respiratory tract infection that will likely resolve with rest, fluids, and prescriptions for symptomatic relief. No indication for imaging today based on stable cardiopulmonary exam and hemodynamically stable vital signs.  COVID-19 PCR testing is pending.  We will call patient if this is positive.  Quarantine guidelines discussed. Currently on day 4 of symptoms and does qualify for antiviral therapy.   Patient given ketorolac 30 mg IM as well as Tylenol in clinic today for body aches.  Tessalon Perles sent to pharmacy for symptomatic relief to be taken as prescribed.  She may use Tylenol/ibuprofen over-the-counter as needed for body aches and fever/chills at home.  May use guaifenesin every 12 hours as needed for nasal congestion and cough.  No NSAID until tomorrow due to ketorolac injection in clinic today.  She verbalized understanding of this.  Heart rate initially elevated at 130.  Heart rate reduced to 114 upon reassessment after ketorolac injection for body aches.  She does not appear to be clinically dehydrated but is ill-appearing, however nontoxic appearing.  She states the ketorolac injection helped slightly with her body aches. Nonpharmacologic interventions for symptom relief provided and after visit summary below.   Strict ED/urgent care return precautions given.  Patient verbalizes understanding and agreement with plan.  Counseled patient regarding possible side effects and uses of all medications  prescribed at today's visit.  Patient verbalizes understanding and agreement with plan.  All questions answered.  Patient discharged from urgent care in stable condition.        Final Clinical Impressions(s) / UC Diagnoses   Final diagnoses:  Influenza-like illness  Body aches     Discharge Instructions      You have a viral upper respiratory infection.  COVID-19 testing is pending. We will call you with results if positive. If your COVID test is positive, you must stay at home until day 6 of symptoms. On day 6, you may go out into public and go back to work, but you must wear a mask until day 11 of symptoms to prevent spread to others.  Purchase mucinex (guaifenesin) '1200mg'$  and take this every 12 hours for the next few days to thin your nasal congestion and mucous so that you are able to get out of  your body easier by coughing and blowing your nose. Drink plenty of water while taking this.  Take tessalon pearles every 8 hours as needed for cough.  You may take tylenol 1,'000mg'$  and ibuprofen '600mg'$  every 6 hours with food as needed for fever/chills, sore throat, aches/pains, and inflammation associated with viral illness. Take this with food to avoid stomach upset.    You may do salt water and baking soda gargles every 4 hours as needed for your throat pain.  Please put 1 teaspoon of salt and 1/2 teaspoon of baking soda in 8 ounces of warm water then gargle and spit the water out. You may also put 1 tablespoon of honey in warm water and drink this to soothe your throat.  Place a humidifier in your room at night to help decrease dry air that can irritate your airway and cause you to have a sore throat and cough.  Please try to eat a well-balanced diet while you are sick so that your body gets proper nutrition to heal.  If you develop any new or worsening symptoms, please return.  If your symptoms are severe, please go to the emergency room.  Follow-up with your primary care provider for  further evaluation and management of your symptoms as well as ongoing wellness visits.  I hope you feel better!     ED Prescriptions     Medication Sig Dispense Auth. Provider   benzonatate (TESSALON) 100 MG capsule Take 1 capsule (100 mg total) by mouth every 8 (eight) hours. 21 capsule Talbot Grumbling, FNP      PDMP not reviewed this encounter.   Talbot Grumbling, [place] 01/26/22 1143

## 2022-01-26 NOTE — ED Notes (Signed)
When RN presented to administer Tylenol, pt states, "aren't you going to give me anything for pain?"; again reiterated to pt that I can only provide Tyl at this time for her body aches.

## 2022-01-26 NOTE — ED Triage Notes (Addendum)
Pt very tearful, initially not answering questions when asked. C/O having fevers, unk how high. C/O cough, congestion and fevers x 3 days. Has been taking Robitussin. Denies any use of Tyl or IBU. When informed pt we could administer Tylenol to help with her body aches, she stated, "that's not going to do anything; can't you give me pain medicine?" Informed pt that for body aches we could only administer Tyl if she desires - pt states she will take it.

## 2022-01-26 NOTE — Discharge Instructions (Signed)
You have a viral upper respiratory infection.  COVID-19 testing is pending. We will call you with results if positive. If your COVID test is positive, you must stay at home until day 6 of symptoms. On day 6, you may go out into public and go back to work, but you must wear a mask until day 11 of symptoms to prevent spread to others.  Purchase mucinex (guaifenesin) '1200mg'$  and take this every 12 hours for the next few days to thin your nasal congestion and mucous so that you are able to get out of your body easier by coughing and blowing your nose. Drink plenty of water while taking this.  Take tessalon pearles every 8 hours as needed for cough.  You may take tylenol 1,'000mg'$  and ibuprofen '600mg'$  every 6 hours with food as needed for fever/chills, sore throat, aches/pains, and inflammation associated with viral illness. Take this with food to avoid stomach upset.    You may do salt water and baking soda gargles every 4 hours as needed for your throat pain.  Please put 1 teaspoon of salt and 1/2 teaspoon of baking soda in 8 ounces of warm water then gargle and spit the water out. You may also put 1 tablespoon of honey in warm water and drink this to soothe your throat.  Place a humidifier in your room at night to help decrease dry air that can irritate your airway and cause you to have a sore throat and cough.  Please try to eat a well-balanced diet while you are sick so that your body gets proper nutrition to heal.  If you develop any new or worsening symptoms, please return.  If your symptoms are severe, please go to the emergency room.  Follow-up with your primary care provider for further evaluation and management of your symptoms as well as ongoing wellness visits.  I hope you feel better!

## 2022-01-27 LAB — SARS CORONAVIRUS 2 (TAT 6-24 HRS): SARS Coronavirus 2: NEGATIVE

## 2022-02-04 ENCOUNTER — Ambulatory Visit: Payer: Medicare (Managed Care) | Admitting: Critical Care Medicine

## 2022-02-11 ENCOUNTER — Other Ambulatory Visit: Payer: Self-pay | Admitting: Critical Care Medicine

## 2022-02-12 ENCOUNTER — Encounter: Payer: Self-pay | Admitting: Critical Care Medicine

## 2022-02-12 ENCOUNTER — Ambulatory Visit: Payer: Medicare (Managed Care) | Attending: Critical Care Medicine | Admitting: Critical Care Medicine

## 2022-02-12 VITALS — BP 102/66 | HR 76 | Temp 98.3°F | Ht 63.0 in | Wt 194.0 lb

## 2022-02-12 DIAGNOSIS — I10 Essential (primary) hypertension: Secondary | ICD-10-CM | POA: Insufficient documentation

## 2022-02-12 DIAGNOSIS — M1712 Unilateral primary osteoarthritis, left knee: Secondary | ICD-10-CM | POA: Insufficient documentation

## 2022-02-12 DIAGNOSIS — Z760 Encounter for issue of repeat prescription: Secondary | ICD-10-CM | POA: Insufficient documentation

## 2022-02-12 DIAGNOSIS — F419 Anxiety disorder, unspecified: Secondary | ICD-10-CM | POA: Diagnosis not present

## 2022-02-12 DIAGNOSIS — E1165 Type 2 diabetes mellitus with hyperglycemia: Secondary | ICD-10-CM | POA: Insufficient documentation

## 2022-02-12 DIAGNOSIS — Z87891 Personal history of nicotine dependence: Secondary | ICD-10-CM | POA: Insufficient documentation

## 2022-02-12 DIAGNOSIS — Z79899 Other long term (current) drug therapy: Secondary | ICD-10-CM | POA: Diagnosis not present

## 2022-02-12 DIAGNOSIS — A530 Latent syphilis, unspecified as early or late: Secondary | ICD-10-CM | POA: Diagnosis not present

## 2022-02-12 DIAGNOSIS — H409 Unspecified glaucoma: Secondary | ICD-10-CM | POA: Diagnosis not present

## 2022-02-12 DIAGNOSIS — Z113 Encounter for screening for infections with a predominantly sexual mode of transmission: Secondary | ICD-10-CM

## 2022-02-12 DIAGNOSIS — Z7984 Long term (current) use of oral hypoglycemic drugs: Secondary | ICD-10-CM | POA: Insufficient documentation

## 2022-02-12 DIAGNOSIS — M109 Gout, unspecified: Secondary | ICD-10-CM | POA: Diagnosis not present

## 2022-02-12 DIAGNOSIS — F319 Bipolar disorder, unspecified: Secondary | ICD-10-CM | POA: Diagnosis not present

## 2022-02-12 DIAGNOSIS — E1169 Type 2 diabetes mellitus with other specified complication: Secondary | ICD-10-CM

## 2022-02-12 DIAGNOSIS — E785 Hyperlipidemia, unspecified: Secondary | ICD-10-CM

## 2022-02-12 LAB — POCT GLYCOSYLATED HEMOGLOBIN (HGB A1C): HbA1c, POC (controlled diabetic range): 6 % (ref 0.0–7.0)

## 2022-02-12 LAB — GLUCOSE, POCT (MANUAL RESULT ENTRY): POC Glucose: 119 mg/dl — AB (ref 70–99)

## 2022-02-12 MED ORDER — ESCITALOPRAM OXALATE 5 MG PO TABS: 5.0000 mg | ORAL_TABLET | Freq: Every day | ORAL | 4 refills | Status: DC

## 2022-02-12 MED ORDER — METFORMIN HCL 500 MG PO TABS: 500.0000 mg | ORAL_TABLET | Freq: Every day | ORAL | 3 refills | Status: DC

## 2022-02-12 MED ORDER — GABAPENTIN 300 MG PO CAPS: 300.0000 mg | ORAL_CAPSULE | Freq: Three times a day (TID) | ORAL | 2 refills | Status: DC

## 2022-02-12 MED ORDER — VITAMIN D (ERGOCALCIFEROL) 1.25 MG (50000 UNIT) PO CAPS: 50000.0000 [IU] | ORAL_CAPSULE | ORAL | 4 refills | Status: DC

## 2022-02-12 MED ORDER — OMEPRAZOLE 40 MG PO CPDR: 40.0000 mg | DELAYED_RELEASE_CAPSULE | Freq: Every day | ORAL | 3 refills | Status: DC

## 2022-02-12 MED ORDER — LOSARTAN POTASSIUM-HCTZ 50-12.5 MG PO TABS: 1.0000 | ORAL_TABLET | Freq: Every day | ORAL | 2 refills | Status: DC

## 2022-02-12 MED ORDER — ARIPIPRAZOLE 15 MG PO TABS: 15.0000 mg | ORAL_TABLET | Freq: Every day | ORAL | 4 refills | Status: DC

## 2022-02-12 MED ORDER — ATORVASTATIN CALCIUM 10 MG PO TABS: 10.0000 mg | ORAL_TABLET | Freq: Every day | ORAL | 1 refills | Status: DC

## 2022-02-12 MED ORDER — BENZTROPINE MESYLATE 0.5 MG PO TABS: 0.5000 mg | ORAL_TABLET | Freq: Every day | ORAL | 4 refills | Status: DC

## 2022-02-12 MED ORDER — TRAZODONE HCL 100 MG PO TABS: ORAL_TABLET | ORAL | 1 refills | Status: DC

## 2022-02-12 MED ORDER — BACLOFEN 10 MG PO TABS: 10.0000 mg | ORAL_TABLET | Freq: Three times a day (TID) | ORAL | 3 refills | Status: DC

## 2022-02-12 NOTE — Assessment & Plan Note (Signed)
Under care of ophthalmology for glaucoma continue current care

## 2022-02-12 NOTE — Patient Instructions (Signed)
Labs today include STD screening labs ordered by infectious disease  No change in medications Refill sent to pharmacy  Return to Dr. Joya Gaskins 6 months

## 2022-02-12 NOTE — Assessment & Plan Note (Signed)
Hypertension well-controlled continue losartan HCT

## 2022-02-12 NOTE — Assessment & Plan Note (Signed)
Stable at this time refill medications patient has mental health follow-up

## 2022-02-12 NOTE — Assessment & Plan Note (Signed)
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

## 2022-02-12 NOTE — Assessment & Plan Note (Signed)
A1c at goal

## 2022-02-12 NOTE — Assessment & Plan Note (Signed)
Continue with statin. 

## 2022-02-12 NOTE — Progress Notes (Signed)
Established Patient Office Visit  Subjective:  Patient ID: Sydney Harris, female    DOB: 1965/06/06  Age: 57 y.o. MRN: [id number]  CC:  Chief Complaint  Patient presents with   Hypertension    HTN & DM f/u. Med refill.  Discuss meds    HPI Sydney Harris presents for diabetes follow-up.   12/2020  On arrival blood pressure 113/78 glucose 133.  Patient does have an eye appointment coming up in January.  Patient does see pain management and orthopedics for her chronic knee pain.  She is on periodic oxycodone.  Recent work-up laboratory wise is noted below from the pain clinic  We offered pneumonia vaccine she wishes to think about that  Vit D 52.5 nl CBC Normal RA factor < 7 Drug screen pos oxycodone/hydrocodone only ANA NEG CMET normal  Cr 0.9  K 3.7   LFT normal  Ca 10.3 ESR 49 slightly high The patient has been losing weight she is lost up to 20 pounds with dietary changes is improved her blood pressure control and diabetes significantly.  Her last A1c was 6.4  9/13   Patient seen in return follow-up arrival blood sugar 166 blood pressure 109/70.  Patient does not declined to receive any vaccines at this visit.  Patient has been doing very well overall blood sugars at home anywhere from 90-120.  She is going to [facility] pain clinic receives Percocet 4 times a day for chronic left knee pain.  She needs follow-up labs at this visit.  She does not have a mental health provider she does take chronic medications for bipolar disorder.  She been compliant with all her diabetic medications at this time.  02/12/22 Patient seen in return follow-up overall doing well hemoglobin A1c is 6 and blood pressure is good 102/66. Patient does follow for chronic pain management and the pain clinic [facility].  Patient also has bipolar disorder as followed by mental health needs refills on her Abilify.  Patient is followed by ophthalmology recent eye exam was normal  except for glaucoma she is on eyedrops.  Patient maintains low-dose metformin and also maintains losartan HCT for blood pressure Since the last visit patient was seen by infectious disease for latent syphilis received Bicillin x 2 Past Medical History:  Diagnosis Date   Anxiety    Arthritis of left knee    Bipolar disorder ([place])    Blood transfusion 1981   "related to my daughter being born"   Depression    Diabetes mellitus without complication ([place])    Fibroid tumor    Gout    Hypertension    Plantar fasciitis    Substance abuse ([place])    crack cocaine  14 yrs sober.    Past Surgical History:  Procedure Laterality Date   ABDOMINAL HYSTERECTOMY     "for fibroid tumors"    Family History  Problem Relation Age of Onset   Diabetes Father    Asthma Daughter    Heart failure Neg Hx    Cancer Neg Hx    Colon polyps Neg Hx    Colon cancer Neg Hx    Esophageal cancer Neg Hx    Rectal cancer Neg Hx    Stomach cancer Neg Hx     Social History   Socioeconomic History   Marital status: Single    Spouse name: Not on file   Number of children: Not on file   Years of education: Not on file  Highest education level: 9th grade  Occupational History   Not on file  Tobacco Use   Smoking status: Former    Packs/day: 0.50    Years: 33.00    Total pack years: 16.50    Types: Cigarettes    Quit date: 2018    Years since quitting: 6.0   Smokeless tobacco: Never  Vaping Use   Vaping Use: Never used  Substance and Sexual Activity   Alcohol use: No   Drug use: Not Currently    Types: "Crack" cocaine, Marijuana    Comment: clean since 2015; currently chews CBD gummies   Sexual activity: Yes    Partners: Male  Other Topics Concern   Not on file  Social History Narrative   Not on file   Social Determinants of Health   Financial Resource Strain: Low Risk  (06/07/2021)   Overall Financial Resource Strain (CARDIA)    Difficulty of Paying Living Expenses: Not hard at all   Food Insecurity: No Food Insecurity (06/07/2021)   Hunger Vital Sign    Worried About Running Out of Food in the Last Year: Never true    Ran Out of Food in the Last Year: Never true  Transportation Needs: No Transportation Needs (06/07/2021)   PRAPARE - Hydrologist (Medical): No    Lack of Transportation (Non-Medical): No  Physical Activity: Sufficiently Active (06/07/2021)   Exercise Vital Sign    Days of Exercise per Week: 3 days    Minutes of Exercise per Session: 60 min  Stress: No Stress Concern Present (06/07/2021)   [place]    Feeling of Stress : Not at all  Social Connections: Moderately Isolated (06/07/2021)   Social Connection and Isolation Panel [NHANES]    Frequency of Communication with Friends and Family: Twice a week    Frequency of Social Gatherings with Friends and Family: More than three times a week    Attends Religious Services: More than 4 times per year    Active Member of [employer] or Organizations: No    Attends Archivist Meetings: Never    Marital Status: Never married  Intimate Partner Violence: Not At Risk (06/07/2021)   Humiliation, Afraid, Rape, and Kick questionnaire    Fear of Current or Ex-Partner: No    Emotionally Abused: No    Physically Abused: No    Sexually Abused: No    Outpatient Medications Prior to Visit  Medication Sig Dispense Refill   Accu-Chek Softclix Lancets lancets Use to measure blood sugar twice a day. E11.65 100 each 6   Blood Glucose Monitoring Suppl (ACCU-CHEK GUIDE ME) w/Device KIT Use to measure blood sugar twice a day. E11.65 1 kit 0   glucose blood (ACCU-CHEK GUIDE) test strip Use to measure blood sugar twice a day. E11.65 100 each 6   latanoprost (XALATAN) 0.005 % ophthalmic solution Place 1 drop into both eyes at bedtime.     Misc. Devices (CANE) MISC 1 each by Does not apply route daily as needed. 1 each 0   Multiple  Vitamin (MULTIVITAMIN ADULT PO) Take by mouth daily.     Multiple Vitamins-Minerals (HAIR SKIN AND NAILS FORMULA PO) Take by mouth daily.     naloxone (NARCAN) nasal spray 4 mg/0.1 mL SMARTSIG:Both Nares     oxyCODONE-acetaminophen (PERCOCET) 10-325 MG tablet Take 1 tablet by mouth 4 (four) times daily as needed.     oxyCODONE-acetaminophen (PERCOCET) 7.5-325 MG tablet  Take 1 tablet by mouth 4 (four) times daily as needed.     ARIPiprazole (ABILIFY) 15 MG tablet Take 1 tablet (15 mg total) by mouth at bedtime. 30 tablet 4   atorvastatin (LIPITOR) 10 MG tablet Take 1 tablet (10 mg total) by mouth daily. 90 tablet 1   baclofen (LIORESAL) 10 MG tablet Take 1 tablet (10 mg total) by mouth 3 (three) times daily. 90 each 3   benztropine (COGENTIN) 0.5 MG tablet Take 1 tablet (0.5 mg total) by mouth at bedtime. 30 tablet 4   escitalopram (LEXAPRO) 5 MG tablet Take 1 tablet (5 mg total) by mouth daily. 30 tablet 4   gabapentin (NEURONTIN) 300 MG capsule Take 300 mg by mouth 3 (three) times daily.     losartan-hydrochlorothiazide (HYZAAR) 50-12.5 MG tablet Take 1 tablet by mouth once daily 90 tablet 0   metFORMIN (GLUCOPHAGE) 500 MG tablet Take 1 tablet (500 mg total) by mouth daily with breakfast. 90 tablet 3   omeprazole (PRILOSEC) 40 MG capsule Take 1 capsule (40 mg total) by mouth daily. 60 capsule 3   traZODone (DESYREL) 100 MG tablet TAKE 1 & 1/2 (ONE & ONE-HALF) TABLETS BY MOUTH AT BEDTIME AS NEEDED 45 tablet 0   Vitamin D, Ergocalciferol, (DRISDOL) 1.25 MG (50000 UNIT) CAPS capsule Take 1 capsule (50,000 Units total) by mouth once a week. 12 capsule 4   benzonatate (TESSALON) 100 MG capsule Take 1 capsule (100 mg total) by mouth every 8 (eight) hours. (Patient not taking: Reported on 02/12/2022) 21 capsule 0   cloNIDine (CATAPRES) 0.1 MG tablet Take 0.1 mg by mouth 2 (two) times daily. (Patient not taking: Reported on 02/12/2022)     doxycycline (VIBRA-TABS) 100 MG tablet Take 1 tablet (100 mg total)  by mouth 2 (two) times daily. (Patient not taking: Reported on 02/12/2022) 56 tablet 0   metroNIDAZOLE (FLAGYL) 500 MG tablet Take 1 tablet (500 mg total) by mouth 2 (two) times daily. (Patient not taking: Reported on 02/12/2022) 14 tablet 0   prazosin (MINIPRESS) 1 MG capsule Take 1 capsule (1 mg total) by mouth at bedtime. (Patient not taking: Reported on 02/12/2022) 30 capsule 4   No facility-administered medications prior to visit.    Allergies  Allergen Reactions   Penicillins Hives    Has patient had a PCN reaction causing immediate rash, facial/tongue/throat swelling, SOB or lightheadedness with hypotension: Yes Has patient had a PCN reaction causing severe rash involving mucus membranes or skin necrosis: Yes Has patient had a PCN reaction that required hospitalization No Has patient had a PCN reaction occurring within the last 10 years: Yes If all of the above answers are "NO", then may proceed with Cephalosporin use.    Tomato     hives    ROS Review of Systems  Constitutional: Negative.   HENT: Negative.  Negative for ear pain, postnasal drip, rhinorrhea, sinus pressure, sore throat, trouble swallowing and voice change.   Eyes: Negative.   Respiratory: Negative.  Negative for apnea, cough, choking, chest tightness, shortness of breath, wheezing and stridor.   Cardiovascular: Negative.  Negative for chest pain, palpitations and leg swelling.  Gastrointestinal: Negative.  Negative for abdominal distention, abdominal pain, nausea and vomiting.  Genitourinary: Negative.   Musculoskeletal:  Positive for joint swelling. Negative for arthralgias and myalgias.       Chronic left knee pain  Skin: Negative.  Negative for rash.  Allergic/Immunologic: Negative.  Negative for environmental allergies and food allergies.  Neurological: Negative.  Negative for dizziness, syncope, weakness and headaches.  Hematological: Negative.  Negative for adenopathy. Does not bruise/bleed easily.   Psychiatric/Behavioral: Negative.  Negative for agitation and sleep disturbance. The patient is not nervous/anxious.       Objective:    Physical Exam Vitals reviewed.  Constitutional:      Appearance: Normal appearance. She is well-developed. She is obese. She is not diaphoretic.  HENT:     Head: Normocephalic and atraumatic.     Nose: No nasal deformity, septal deviation, mucosal edema or rhinorrhea.     Right Sinus: No maxillary sinus tenderness or frontal sinus tenderness.     Left Sinus: No maxillary sinus tenderness or frontal sinus tenderness.     Mouth/Throat:     Pharynx: No oropharyngeal exudate.  Eyes:     General: No scleral icterus.    Conjunctiva/sclera: Conjunctivae normal.     Pupils: Pupils are equal, round, and reactive to light.  Neck:     Thyroid: No thyromegaly.     Vascular: No carotid bruit or JVD.     Trachea: Trachea normal. No tracheal tenderness or tracheal deviation.  Cardiovascular:     Rate and Rhythm: Normal rate and regular rhythm.     Chest Wall: PMI is not displaced.     Pulses: Normal pulses. No decreased pulses.     Heart sounds: Normal heart sounds, S1 normal and S2 normal. Heart sounds not distant. No murmur heard.    No systolic murmur is present.     No diastolic murmur is present.     No friction rub. No gallop. No S3 or S4 sounds.  Pulmonary:     Effort: No tachypnea, accessory muscle usage or respiratory distress.     Breath sounds: No stridor. No decreased breath sounds, wheezing, rhonchi or rales.  Chest:     Chest wall: No tenderness.  Abdominal:     General: Bowel sounds are normal. There is no distension.     Palpations: Abdomen is soft. Abdomen is not rigid.     Tenderness: There is no abdominal tenderness. There is no guarding or rebound.  Musculoskeletal:        General: Normal range of motion.     Cervical back: Normal range of motion and neck supple. No edema, erythema or rigidity. No muscular tenderness. Normal range  of motion.  Lymphadenopathy:     Head:     Right side of head: No submental or submandibular adenopathy.     Left side of head: No submental or submandibular adenopathy.     Cervical: No cervical adenopathy.  Skin:    General: Skin is warm and dry.     Coloration: Skin is not pale.     Findings: No rash.     Nails: There is no clubbing.  Neurological:     Mental Status: She is alert and oriented to person, place, and time.     Sensory: No sensory deficit.  Psychiatric:        Speech: Speech normal.        Behavior: Behavior normal.     BP 102/66 (BP Location: Left Arm, Patient Position: Sitting, Cuff Size: Normal)   Pulse 76   Temp 98.3 F (36.8 C) (Oral)   Ht '5\' 3"'$  (1.6 m)   Wt 194 lb (88 kg)   LMP 12/28/2010   SpO2 98%   BMI 34.37 kg/m  Wt Readings from Last 3 Encounters:  02/12/22 194 lb (88 kg)  11/05/21 193  lb (87.5 kg)  10/02/21 192 lb 12.8 oz (87.5 kg)     Health Maintenance Due  Topic Date Due   DTaP/Tdap/Td (1 - Tdap) Never done   FOOT EXAM  03/28/2021   COVID-19 Vaccine (3 - 2023-24 season) 09/20/2021    There are no preventive care reminders to display for this patient.  No results found for: "TSH" Lab Results  Component Value Date   WBC 8.4 10/02/2021   HGB 13.4 10/02/2021   HCT 40.5 10/02/2021   MCV 87 10/02/2021   PLT 426 10/02/2021   Lab Results  Component Value Date   NA 145 (H) 10/02/2021   K 4.3 10/02/2021   CO2 27 10/02/2021   GLUCOSE 85 10/02/2021   BUN 19 10/02/2021   CREATININE 0.88 10/02/2021   BILITOT 0.2 10/02/2021   ALKPHOS 64 10/02/2021   AST 14 10/02/2021   ALT 15 10/02/2021   PROT 7.5 10/02/2021   ALBUMIN 4.9 10/02/2021   CALCIUM 10.6 (H) 10/02/2021   ANIONGAP 10 02/08/2016   EGFR 76 10/02/2021   Lab Results  Component Value Date   CHOL 200 (H) 10/02/2021   Lab Results  Component Value Date   HDL 68 10/02/2021   Lab Results  Component Value Date   LDLCALC 118 (H) 10/02/2021   Lab Results  Component  Value Date   TRIG 76 10/02/2021   Lab Results  Component Value Date   CHOLHDL 2.9 10/02/2021   Lab Results  Component Value Date   HGBA1C 6.0 02/12/2022      Assessment & Plan:   Problem List Items Addressed This Visit       Cardiovascular and Mediastinum   HTN (hypertension)    Hypertension well-controlled continue losartan HCT      Relevant Medications   atorvastatin (LIPITOR) 10 MG tablet   losartan-hydrochlorothiazide (HYZAAR) 50-12.5 MG tablet     Endocrine   Type 2 diabetes mellitus with hyperglycemia, without long-term current use of insulin (HCC) - Primary    A1c at goal      Relevant Medications   atorvastatin (LIPITOR) 10 MG tablet   losartan-hydrochlorothiazide (HYZAAR) 50-12.5 MG tablet   metFORMIN (GLUCOPHAGE) 500 MG tablet   Other Relevant Orders   POCT glucose (manual entry) (Completed)   POCT glycosylated hemoglobin (Hb A1C) (Completed)   Hyperlipidemia associated with type 2 diabetes mellitus ([place])    Continue with statin      Relevant Medications   atorvastatin (LIPITOR) 10 MG tablet   losartan-hydrochlorothiazide (HYZAAR) 50-12.5 MG tablet   metFORMIN (GLUCOPHAGE) 500 MG tablet     Other   Bipolar 1 disorder (HCC)    Stable at this time refill medications patient has mental health follow-up      Glaucoma    Under care of ophthalmology for glaucoma continue current care      Relevant Medications   latanoprost (XALATAN) 0.005 % ophthalmic solution   Latent syphilis    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    Dispense:  30 tablet    Refill:  4   atorvastatin (LIPITOR) 10 MG tablet    Sig: Take 1 tablet (10 mg total) by mouth daily.    Dispense:  90 tablet    Refill:  1   baclofen (LIORESAL) 10 MG tablet    Sig: Take 1 tablet (10 mg total) by mouth 3 (three) times daily.    Dispense:  90 each    Refill:  3   benztropine (COGENTIN) 0.5 MG tablet    Sig: Take 1 tablet (0.5 mg total) by mouth at bedtime.    Dispense:  30 tablet    Refill:  4   escitalopram (LEXAPRO) 5 MG tablet    Sig: Take 1 tablet (5 mg total) by mouth daily.    Dispense:  30 tablet    Refill:  4   gabapentin (NEURONTIN) 300 MG capsule    Sig: Take 1 capsule (300 mg total) by mouth 3 (three) times daily.    Dispense:  90 capsule    Refill:  2   losartan-hydrochlorothiazide (HYZAAR) 50-12.5 MG tablet    Sig: Take 1 tablet by mouth daily.    Dispense:  90 tablet    Refill:  2   metFORMIN (GLUCOPHAGE) 500 MG tablet    Sig: Take 1 tablet (500 mg total) by mouth daily with breakfast.    Dispense:  90 tablet    Refill:  3   omeprazole (PRILOSEC) 40 MG capsule    Sig: Take 1 capsule (40 mg total) by mouth daily.    Dispense:  60 capsule    Refill:  3   traZODone (DESYREL) 100 MG tablet    Sig: TAKE 1 & 1/2 (ONE & ONE-HALF) TABLETS BY MOUTH AT BEDTIME AS NEEDED    Dispense:  45 tablet    Refill:  1   Vitamin D, Ergocalciferol, (DRISDOL) 1.25 MG (50000 UNIT) CAPS capsule    Sig: Take 1 capsule (50,000 Units total) by mouth once a week.    Dispense:  12 capsule    Refill:  4    Follow-up: Return in about 6 months (around 08/13/2022) for htn, diabetes.    Asencion Noble, MD

## 2022-02-13 LAB — HCV INTERPRETATION

## 2022-02-13 LAB — HEPATITIS B SURFACE ANTIGEN: Hepatitis B Surface Ag: NEGATIVE

## 2022-02-13 LAB — HIV ANTIBODY (ROUTINE TESTING W REFLEX): HIV Screen 4th Generation wRfx: NONREACTIVE

## 2022-02-13 LAB — HEPATITIS C ANTIBODY: Hep C Virus Ab: NONREACTIVE

## 2022-02-13 LAB — HEPATITIS B SURFACE ANTIBODY, QUANTITATIVE: Hepatitis B Surf Ab Quant: 3.5 m[IU]/mL — ABNORMAL LOW (ref >9.9)

## 2022-02-13 LAB — RPR: RPR Ser Ql: NONREACTIVE

## 2022-02-13 LAB — HCV AB W REFLEX TO QUANT PCR: HCV Ab: NONREACTIVE

## 2022-02-13 NOTE — Progress Notes (Signed)
Let patient know her STD screening labs are all normal

## 2022-02-14 ENCOUNTER — Telehealth: Payer: Self-pay

## 2022-02-14 NOTE — Telephone Encounter (Signed)
Pt was called and is aware of results, DOB was confirmed.  ?

## 2022-02-14 NOTE — Telephone Encounter (Signed)
-----  Message from Elsie Stain, MD sent at 02/13/2022  5:41 AM EST ----- Let patient know her STD screening labs are all normal

## 2022-03-20 ENCOUNTER — Ambulatory Visit: Payer: Self-pay

## 2022-03-20 NOTE — Telephone Encounter (Signed)
Message from [employer] sent at 03/20/2022  8:20 AM EST  Summary: left leg discomfort / rx req   The patient shares that they have experienced left leg discomfort for roughly a week  The patient has called to request a prescription for predniSONE (DELTASONE) 50 MG tablet [id number] to help with their discomfort  The patient shares that they have a history of gout concerns  The patient would like to speak with a member of staff when possible regarding their concerns  Please contact further when possible         Chief Complaint: left leg/foot discomfort Symptoms: severe pain and mild swelling to left foot and lower leg Frequency: yesterday Pertinent Negatives: Patient denies chest pain, back, pain, breathing, difficulty, rash, fever, numbness or weakness Disposition: '[]'$ ED /'[]'$ Urgent Care (no appt availability in office) / '[]'$ Appointment(In office/virtual)/ '[]'$  [facility]/ '[]'$ Home Care/ '[]'$ Refused Recommended Disposition /'[x]'$ [facility]/ '[]'$  Follow-up with PCP Additional Notes: no openings in clinic.   Reason for Disposition  [1] SEVERE pain (e.g., excruciating, unable to do any normal activities) AND [2] not improved after 2 hours of pain medicine  Answer Assessment - Initial Assessment Questions 1. ONSET: "When did the pain start?"      Yesterday  2. LOCATION: "Where is the pain located?"      Left leg  3. PAIN: "How bad is the pain?"    (Scale 1-10; or mild, moderate, severe)   -  MILD (1-3): doesn't interfere with normal activities    -  MODERATE (4-7): interferes with normal activities (e.g., work or school) or awakens from sleep, limping    -  SEVERE (8-10): excruciating pain, unable to do any normal activities, unable to walk     severe 4. WORK OR EXERCISE: "Has there been any recent work or exercise that involved this part of the body?"      *No Answer* 5. CAUSE: "What do you think is causing the leg pain?"     gout 6. OTHER SYMPTOMS: "Do you have  any other symptoms?" (e.g., chest pain, back pain, breathing difficulty, swelling, rash, fever, numbness, weakness)     Swelling located to foot  7. PREGNANCY: "Is there any chance you are pregnant?" "When was your last menstrual period?"     N/a  Protocols used: Leg Pain-A-AH

## 2022-03-20 NOTE — Telephone Encounter (Signed)
Noted  

## 2022-06-02 ENCOUNTER — Other Ambulatory Visit: Payer: Self-pay

## 2022-06-02 NOTE — Progress Notes (Signed)
   Sydney Harris 05-27-1965 [id number]  Patient seen by Sharman Cheek, PharmD Candidate on 06/02/22 while they were picking up prescriptions at [facility].  Blood Pressure Readings: Systolic BP today: 103 Diastolic BP today: 62   Medication review was performed. Is the patient taking their medications as prescribed?: Yes   The patient has follow up scheduled:  PCP: Storm Frisk, MD   [place]ard Meth, Student-PharmD

## 2022-06-30 NOTE — Progress Notes (Signed)
Established Patient Office Visit  Subjective:  Patient ID: Dominica Severin, female    DOB: 01/15/1966  Age: 57 y.o. MRN: [id number]  CC:  Chief Complaint  Patient presents with   Leg Pain    L leg pain X1 mo - unable to get pain meds (oxy) - pt requesting meds to be delivered. Requesting disability parking form Requesting rx for cane    HPI Elka Jocile Siebenaler presents for diabetes follow-up.   12/2020  On arrival blood pressure 113/78 glucose 133.  Patient does have an eye appointment coming up in January.  Patient does see pain management and orthopedics for her chronic knee pain.  She is on periodic oxycodone.  Recent work-up laboratory wise is noted below from the pain clinic  We offered pneumonia vaccine she wishes to think about that  Vit D 52.5 nl CBC Normal RA factor < 7 Drug screen pos oxycodone/hydrocodone only ANA NEG CMET normal  Cr 0.9  K 3.7   LFT normal  Ca 10.3 ESR 49 slightly high The patient has been losing weight she is lost up to 20 pounds with dietary changes is improved her blood pressure control and diabetes significantly.  Her last A1c was 6.4  9/13   Patient seen in return follow-up arrival blood sugar 166 blood pressure 109/70.  Patient does not declined to receive any vaccines at this visit.  Patient has been doing very well overall blood sugars at home anywhere from 90-120.  She is going to [facility] clinic receives Percocet 4 times a day for chronic left knee pain.  She needs follow-up labs at this visit.  She does not have a mental health provider she does take chronic medications for bipolar disorder.  She been compliant with all her diabetic medications at this time.  02/12/22 Patient seen in return follow-up overall doing well hemoglobin A1c is 6 and blood pressure is good 102/66. Patient does follow for chronic pain management and the pain clinic [facility].  Patient also has bipolar disorder as followed by mental  health needs refills on her Abilify.  Patient is followed by ophthalmology recent eye exam was normal except for glaucoma she is on eyedrops.  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  She is asking for opiates at this visit.  The pain is increasing the left leg she is previously seen orthopedics they recommended knee replacement she declined.  Patient has history of type 2 diabetes A1c today is 6.3 blood sugar is well-controlled and patient is maintaining metformin Patient also with hypertension on arrival today blood pressure good 122/79 patient i on losartan HCT  patient has history of bipolar disorder currently stable on Abilify and Cogentin Prior history of latent syphilis stable at this time Patient requesting a prescription for a cane she has difficulty walking and also handicap placard Past Medical History:  Diagnosis Date   Anxiety    Arthritis of left knee    Bipolar disorder (HCC)    Blood transfusion 1981   "related to my daughter being born"   Depression    Diabetes mellitus without complication (HCC)    Fibroid tumor  Gout    Hypertension    Plantar fasciitis    Substance abuse (HCC)    crack cocaine  14 yrs sober.    Past Surgical History:  Procedure Laterality Date   ABDOMINAL HYSTERECTOMY     "for fibroid tumors"    Family History  Problem Relation Age of Onset   Diabetes Father    Asthma Daughter    Heart failure Neg Hx    Cancer Neg Hx    Colon polyps Neg Hx    Colon cancer Neg Hx    Esophageal cancer Neg Hx    Rectal cancer Neg Hx    Stomach cancer Neg Hx     Social History   Socioeconomic History   Marital status: Single     Spouse name: Not on file   Number of children: Not on file   Years of education: Not on file   Highest education level: 9th grade  Occupational History   Not on file  Tobacco Use   Smoking status: Former    Packs/day: 0.50    Years: 33.00    Additional pack years: 0.00    Total pack years: 16.50    Types: Cigarettes    Quit date: 2018    Years since quitting: 6.4   Smokeless tobacco: Never  Vaping Use   Vaping Use: Never used  Substance and Sexual Activity   Alcohol use: No   Drug use: Not Currently    Types: "Crack" cocaine, Marijuana    Comment: clean since 2015; currently chews CBD gummies   Sexual activity: Yes    Partners: Male  Other Topics Concern   Not on file  Social History Narrative   Not on file   Social Determinants of Health   Financial Resource Strain: Low Risk  (06/07/2021)   Overall Financial Resource Strain (CARDIA)    Difficulty of Paying Living Expenses: Not hard at all  Food Insecurity: No Food Insecurity (06/07/2021)   Hunger Vital Sign    Worried About Running Out of Food in the Last Year: Never true    Ran Out of Food in the Last Year: Never true  Transportation Needs: No Transportation Needs (06/07/2021)   PRAPARE - Administrator, Civil Service (Medical): No    Lack of Transportation (Non-Medical): No  Physical Activity: Sufficiently Active (06/07/2021)   Exercise Vital Sign    Days of Exercise per Week: 3 days    Minutes of Exercise per Session: 60 min  Stress: No Stress Concern Present (06/07/2021)   [employer] of Occupational Health - Occupational Stress Questionnaire    Feeling of Stress : Not at all  Social Connections: Moderately Isolated (06/07/2021)   Social Connection and Isolation Panel [NHANES]    Frequency of Communication with Friends and Family: Twice a week    Frequency of Social Gatherings with Friends and Family: More than three times a week    Attends Religious Services: More than 4 times per year     Active Member of [employer] or Organizations: No    Attends Banker Meetings: Never    Marital Status: Never married  Intimate Partner Violence: Not At Risk (06/07/2021)   Humiliation, Afraid, Rape, and Kick questionnaire    Fear of Current or Ex-Partner: No    Emotionally Abused: No    Physically Abused: No    Sexually Abused: No    Outpatient Medications Prior to Visit  Medication Sig Dispense Refill   Accu-Chek Softclix Lancets  lancets Use to measure blood sugar twice a day. E11.65 100 each 6   Blood Glucose Monitoring Suppl (ACCU-CHEK GUIDE ME) w/Device KIT Use to measure blood sugar twice a day. E11.65 1 kit 0   glucose blood (ACCU-CHEK GUIDE) test strip Use to measure blood sugar twice a day. E11.65 100 each 6   latanoprost (XALATAN) 0.005 % ophthalmic solution Place 1 drop into both eyes at bedtime.     Misc. Devices (CANE) MISC 1 each by Does not apply route daily as needed. 1 each 0   Multiple Vitamin (MULTIVITAMIN ADULT PO) Take by mouth daily.     Multiple Vitamins-Minerals (HAIR SKIN AND NAILS FORMULA PO) Take by mouth daily.     naloxone (NARCAN) nasal spray 4 mg/0.1 mL SMARTSIG:Both Nares     oxyCODONE-acetaminophen (PERCOCET) 10-325 MG tablet Take 1 tablet by mouth 4 (four) times daily as needed.     oxyCODONE-acetaminophen (PERCOCET) 7.5-325 MG tablet Take 1 tablet by mouth 4 (four) times daily as needed.     ARIPiprazole (ABILIFY) 15 MG tablet Take 1 tablet (15 mg total) by mouth at bedtime. 30 tablet 4   atorvastatin (LIPITOR) 10 MG tablet Take 1 tablet (10 mg total) by mouth daily. 90 tablet 1   baclofen (LIORESAL) 10 MG tablet Take 1 tablet (10 mg total) by mouth 3 (three) times daily. 90 each 3   benztropine (COGENTIN) 0.5 MG tablet Take 1 tablet (0.5 mg total) by mouth at bedtime. 30 tablet 4   escitalopram (LEXAPRO) 5 MG tablet Take 1 tablet (5 mg total) by mouth daily. 30 tablet 4   gabapentin (NEURONTIN) 300 MG capsule Take 1 capsule (300 mg total) by mouth  3 (three) times daily. 90 capsule 2   losartan-hydrochlorothiazide (HYZAAR) 50-12.5 MG tablet Take 1 tablet by mouth daily. 90 tablet 2   metFORMIN (GLUCOPHAGE) 500 MG tablet Take 1 tablet (500 mg total) by mouth daily with breakfast. 90 tablet 3   omeprazole (PRILOSEC) 40 MG capsule Take 1 capsule (40 mg total) by mouth daily. 60 capsule 3   traZODone (DESYREL) 100 MG tablet TAKE 1 & 1/2 (ONE & ONE-HALF) TABLETS BY MOUTH AT BEDTIME AS NEEDED 45 tablet 1   Vitamin D, Ergocalciferol, (DRISDOL) 1.25 MG (50000 UNIT) CAPS capsule Take 1 capsule (50,000 Units total) by mouth once a week. 12 capsule 4   No facility-administered medications prior to visit.    Allergies  Allergen Reactions   Penicillins Hives    Has patient had a PCN reaction causing immediate rash, facial/tongue/throat swelling, SOB or lightheadedness with hypotension: Yes Has patient had a PCN reaction causing severe rash involving mucus membranes or skin necrosis: Yes Has patient had a PCN reaction that required hospitalization No Has patient had a PCN reaction occurring within the last 10 years: Yes If all of the above answers are "NO", then may proceed with Cephalosporin use.    Tomato     hives    ROS Review of Systems  Constitutional: Negative.   HENT: Negative.  Negative for ear pain, postnasal drip, rhinorrhea, sinus pressure, sore throat, trouble swallowing and voice change.   Eyes: Negative.   Respiratory: Negative.  Negative for apnea, cough, choking, chest tightness, shortness of breath, wheezing and stridor.   Cardiovascular: Negative.  Negative for chest pain, palpitations and leg swelling.  Gastrointestinal: Negative.  Negative for abdominal distention, abdominal pain, nausea and vomiting.  Genitourinary: Negative.   Musculoskeletal:  Positive for joint swelling. Negative for arthralgias and myalgias.  Chronic left knee pain  Skin: Negative.  Negative for rash.  Allergic/Immunologic: Negative.   Negative for environmental allergies and food allergies.  Neurological: Negative.  Negative for dizziness, syncope, weakness and headaches.  Hematological: Negative.  Negative for adenopathy. Does not bruise/bleed easily.  Psychiatric/Behavioral: Negative.  Negative for agitation and sleep disturbance. The patient is not nervous/anxious.       Objective:    Physical Exam Vitals reviewed.  Constitutional:      Appearance: Normal appearance. She is well-developed. She is obese. She is not diaphoretic.  HENT:     Head: Normocephalic and atraumatic.     Nose: No nasal deformity, septal deviation, mucosal edema or rhinorrhea.     Right Sinus: No maxillary sinus tenderness or frontal sinus tenderness.     Left Sinus: No maxillary sinus tenderness or frontal sinus tenderness.     Mouth/Throat:     Pharynx: No oropharyngeal exudate.  Eyes:     General: No scleral icterus.    Conjunctiva/sclera: Conjunctivae normal.     Pupils: Pupils are equal, round, and reactive to light.  Neck:     Thyroid: No thyromegaly.     Vascular: No carotid bruit or JVD.     Trachea: Trachea normal. No tracheal tenderness or tracheal deviation.  Cardiovascular:     Rate and Rhythm: Normal rate and regular rhythm.     Chest Wall: PMI is not displaced.     Pulses: Normal pulses. No decreased pulses.     Heart sounds: Normal heart sounds, S1 normal and S2 normal. Heart sounds not distant. No murmur heard.    No systolic murmur is present.     No diastolic murmur is present.     No friction rub. No gallop. No S3 or S4 sounds.  Pulmonary:     Effort: No tachypnea, accessory muscle usage or respiratory distress.     Breath sounds: No stridor. No decreased breath sounds, wheezing, rhonchi or rales.  Chest:     Chest wall: No tenderness.  Abdominal:     General: Bowel sounds are normal. There is no distension.     Palpations: Abdomen is soft. Abdomen is not rigid.     Tenderness: There is no abdominal  tenderness. There is no guarding or rebound.  Musculoskeletal:        General: Tenderness present. Normal range of motion.     Cervical back: Normal range of motion and neck supple. No edema, erythema or rigidity. No muscular tenderness. Normal range of motion.     Comments: Medial tenderness of the left knee foot exam normal  Lymphadenopathy:     Head:     Right side of head: No submental or submandibular adenopathy.     Left side of head: No submental or submandibular adenopathy.     Cervical: No cervical adenopathy.  Skin:    General: Skin is warm and dry.     Coloration: Skin is not pale.     Findings: No rash.     Nails: There is no clubbing.  Neurological:     Mental Status: She is alert and oriented to person, place, and time.     Sensory: No sensory deficit.  Psychiatric:        Speech: Speech normal.        Behavior: Behavior normal.     BP 122/79 (BP Location: Left Arm, Patient Position: Sitting, Cuff Size: Normal)   Pulse 60   Temp 98.1 F (36.7 C) (Oral)  Ht 5\' 3"  (1.6 m)   Wt 195 lb (88.5 kg)   LMP 12/28/2010   SpO2 100%   BMI 34.54 kg/m  Wt Readings from Last 3 Encounters:  07/03/22 195 lb (88.5 kg)  02/12/22 194 lb (88 kg)  11/05/21 193 lb (87.5 kg)     Health Maintenance Due  Topic Date Due   DTaP/Tdap/Td (1 - Tdap) Never done   COVID-19 Vaccine (3 - 2023-24 season) 09/20/2021   Medicare Annual Wellness (AWV)  06/08/2022    There are no preventive care reminders to display for this patient.  No results found for: "TSH" Lab Results  Component Value Date   WBC 8.4 10/02/2021   HGB 13.4 10/02/2021   HCT 40.5 10/02/2021   MCV 87 10/02/2021   PLT 426 10/02/2021   Lab Results  Component Value Date   NA 144 07/03/2022   K 4.1 07/03/2022   CO2 27 07/03/2022   GLUCOSE 96 07/03/2022   BUN 20 07/03/2022   CREATININE 0.84 07/03/2022   BILITOT 0.3 07/03/2022   ALKPHOS 63 07/03/2022   AST 19 07/03/2022   ALT 17 07/03/2022   PROT 7.3 07/03/2022    ALBUMIN 4.7 07/03/2022   CALCIUM 10.4 (H) 07/03/2022   ANIONGAP 10 02/08/2016   EGFR 80 07/03/2022   Lab Results  Component Value Date   CHOL 189 07/03/2022   Lab Results  Component Value Date   HDL 74 07/03/2022   Lab Results  Component Value Date   LDLCALC 100 (H) 07/03/2022   Lab Results  Component Value Date   TRIG 86 07/03/2022   Lab Results  Component Value Date   CHOLHDL 2.6 07/03/2022   Lab Results  Component Value Date   HGBA1C 6.3 (H) 07/03/2022      Assessment & Plan:   Problem List Items Addressed This Visit       Cardiovascular and Mediastinum   HTN (hypertension)    Hypertension under good control electrolytes stable renal function stable continue losartan HCT      Relevant Medications   atorvastatin (LIPITOR) 10 MG tablet   losartan-hydrochlorothiazide (HYZAAR) 50-12.5 MG tablet   Other Relevant Orders   Comprehensive metabolic panel (Completed)     Endocrine   Type 2 diabetes mellitus with hyperglycemia, without long-term current use of insulin (HCC) - Primary    Type 2 diabetes currently at goal continue lifestyle management alone      Relevant Medications   atorvastatin (LIPITOR) 10 MG tablet   losartan-hydrochlorothiazide (HYZAAR) 50-12.5 MG tablet   metFORMIN (GLUCOPHAGE) 500 MG tablet   Other Relevant Orders   Comprehensive metabolic panel (Completed)   Hemoglobin A1c (Completed)   Hyperlipidemia associated with type 2 diabetes mellitus (HCC)    LDL not yet at goal we will need additional adjustments      Relevant Medications   atorvastatin (LIPITOR) 10 MG tablet   losartan-hydrochlorothiazide (HYZAAR) 50-12.5 MG tablet   metFORMIN (GLUCOPHAGE) 500 MG tablet   Other Relevant Orders   Lipid panel (Completed)     Other   Chronic pain of left knee    Severe arthritis left knee referral back to orthopedics I declined to prescribe opiates at this visit      Relevant Medications   baclofen (LIORESAL) 10 MG tablet    escitalopram (LEXAPRO) 5 MG tablet   gabapentin (NEURONTIN) 300 MG capsule   traZODone (DESYREL) 100 MG tablet   Other Relevant Orders   Ambulatory referral to Orthopedic Surgery   Ambulatory  referral to Pain Clinic   For home use only DME Cane   Chronic bilateral low back pain without sciatica   Relevant Medications   baclofen (LIORESAL) 10 MG tablet   escitalopram (LEXAPRO) 5 MG tablet   gabapentin (NEURONTIN) 300 MG capsule   traZODone (DESYREL) 100 MG tablet   Other Relevant Orders   Ambulatory referral to Pain Clinic   Other Visit Diagnoses     Chronic pain syndrome       Relevant Medications   baclofen (LIORESAL) 10 MG tablet   escitalopram (LEXAPRO) 5 MG tablet   gabapentin (NEURONTIN) 300 MG capsule   traZODone (DESYREL) 100 MG tablet   Other Relevant Orders   Ambulatory referral to Pain Clinic      Meds ordered this encounter  Medications   ARIPiprazole (ABILIFY) 15 MG tablet    Sig: Take 1 tablet (15 mg total) by mouth at bedtime.    Dispense:  30 tablet    Refill:  4    Mail to home   atorvastatin (LIPITOR) 10 MG tablet    Sig: Take 1 tablet (10 mg total) by mouth daily.    Dispense:  90 tablet    Refill:  1    Mail to home   baclofen (LIORESAL) 10 MG tablet    Sig: Take 1 tablet (10 mg total) by mouth 3 (three) times daily.    Dispense:  90 each    Refill:  3    Mail to home   benztropine (COGENTIN) 0.5 MG tablet    Sig: Take 1 tablet (0.5 mg total) by mouth at bedtime.    Dispense:  30 tablet    Refill:  4    Mail to home   escitalopram (LEXAPRO) 5 MG tablet    Sig: Take 1 tablet (5 mg total) by mouth daily.    Dispense:  30 tablet    Refill:  4    Mail to home   gabapentin (NEURONTIN) 300 MG capsule    Sig: Take 1 capsule (300 mg total) by mouth 3 (three) times daily.    Dispense:  90 capsule    Refill:  2    Mail to home   losartan-hydrochlorothiazide (HYZAAR) 50-12.5 MG tablet    Sig: Take 1 tablet by mouth daily.    Dispense:  90 tablet     Refill:  2    Mail to home   metFORMIN (GLUCOPHAGE) 500 MG tablet    Sig: Take 1 tablet (500 mg total) by mouth daily with breakfast.    Dispense:  90 tablet    Refill:  3    Mail to home   omeprazole (PRILOSEC) 40 MG capsule    Sig: Take 1 capsule (40 mg total) by mouth daily.    Dispense:  60 capsule    Refill:  3    Mail to home   traZODone (DESYREL) 100 MG tablet    Sig: TAKE 1 & 1/2 (ONE & ONE-HALF) TABLETS BY MOUTH AT BEDTIME AS NEEDED    Dispense:  45 tablet    Refill:  1    Mail to home   Vitamin D, Ergocalciferol, (DRISDOL) 1.25 MG (50000 UNIT) CAPS capsule    Sig: Take 1 capsule (50,000 Units total) by mouth once a week.    Dispense:  12 capsule    Refill:  4    Mail to home    Follow-up: Return in about 4 months (around 11/02/2022) for htn, diabetes, chronic  pain.    Shan Levans, MD

## 2022-07-03 ENCOUNTER — Other Ambulatory Visit (HOSPITAL_COMMUNITY): Payer: Self-pay

## 2022-07-03 ENCOUNTER — Ambulatory Visit: Payer: Medicare (Managed Care) | Attending: Critical Care Medicine | Admitting: Critical Care Medicine

## 2022-07-03 ENCOUNTER — Encounter: Payer: Self-pay | Admitting: Critical Care Medicine

## 2022-07-03 VITALS — BP 122/79 | HR 60 | Temp 98.1°F | Ht 63.0 in | Wt 195.0 lb

## 2022-07-03 DIAGNOSIS — I10 Essential (primary) hypertension: Secondary | ICD-10-CM | POA: Diagnosis not present

## 2022-07-03 DIAGNOSIS — M545 Low back pain, unspecified: Secondary | ICD-10-CM

## 2022-07-03 DIAGNOSIS — M25562 Pain in left knee: Secondary | ICD-10-CM

## 2022-07-03 DIAGNOSIS — E1165 Type 2 diabetes mellitus with hyperglycemia: Secondary | ICD-10-CM | POA: Diagnosis not present

## 2022-07-03 DIAGNOSIS — E785 Hyperlipidemia, unspecified: Secondary | ICD-10-CM | POA: Diagnosis not present

## 2022-07-03 DIAGNOSIS — G894 Chronic pain syndrome: Secondary | ICD-10-CM

## 2022-07-03 DIAGNOSIS — Z7984 Long term (current) use of oral hypoglycemic drugs: Secondary | ICD-10-CM

## 2022-07-03 DIAGNOSIS — E1169 Type 2 diabetes mellitus with other specified complication: Secondary | ICD-10-CM | POA: Diagnosis not present

## 2022-07-03 DIAGNOSIS — G8929 Other chronic pain: Secondary | ICD-10-CM

## 2022-07-03 MED ORDER — BENZTROPINE MESYLATE 0.5 MG PO TABS
0.5000 mg | ORAL_TABLET | Freq: Every day | ORAL | 4 refills | Status: DC
  Filled 2022-07-03 – 2022-07-04 (×2): qty 30, 30d supply, fill #0

## 2022-07-03 MED ORDER — VITAMIN D (ERGOCALCIFEROL) 1.25 MG (50000 UNIT) PO CAPS
50000.0000 [IU] | ORAL_CAPSULE | ORAL | 4 refills | Status: DC
  Filled 2022-07-03: qty 12, 84d supply, fill #0

## 2022-07-03 MED ORDER — BACLOFEN 10 MG PO TABS
10.0000 mg | ORAL_TABLET | Freq: Three times a day (TID) | ORAL | 3 refills | Status: DC
  Filled 2022-07-03 – 2022-07-04 (×2): qty 90, 30d supply, fill #0

## 2022-07-03 MED ORDER — TRAZODONE HCL 100 MG PO TABS
ORAL_TABLET | ORAL | 1 refills | Status: DC
  Filled 2022-07-03: qty 45, 30d supply, fill #0

## 2022-07-03 MED ORDER — GABAPENTIN 300 MG PO CAPS
300.0000 mg | ORAL_CAPSULE | Freq: Three times a day (TID) | ORAL | 2 refills | Status: DC
  Filled 2022-07-03: qty 90, 30d supply, fill #0

## 2022-07-03 MED ORDER — OMEPRAZOLE 40 MG PO CPDR
40.0000 mg | DELAYED_RELEASE_CAPSULE | Freq: Every day | ORAL | 3 refills | Status: DC
  Filled 2022-07-03: qty 60, 60d supply, fill #0

## 2022-07-03 MED ORDER — METFORMIN HCL 500 MG PO TABS
500.0000 mg | ORAL_TABLET | Freq: Every day | ORAL | 3 refills | Status: DC
  Filled 2022-07-03: qty 90, 90d supply, fill #0

## 2022-07-03 MED ORDER — LOSARTAN POTASSIUM-HCTZ 50-12.5 MG PO TABS
1.0000 | ORAL_TABLET | Freq: Every day | ORAL | 2 refills | Status: DC
  Filled 2022-07-03: qty 90, 90d supply, fill #0

## 2022-07-03 MED ORDER — ARIPIPRAZOLE 15 MG PO TABS
15.0000 mg | ORAL_TABLET | Freq: Every day | ORAL | 4 refills | Status: DC
  Filled 2022-07-03 – 2022-07-04 (×2): qty 30, 30d supply, fill #0

## 2022-07-03 MED ORDER — ESCITALOPRAM OXALATE 5 MG PO TABS
5.0000 mg | ORAL_TABLET | Freq: Every day | ORAL | 4 refills | Status: DC
  Filled 2022-07-03 – 2022-07-04 (×2): qty 30, 30d supply, fill #0

## 2022-07-03 MED ORDER — ATORVASTATIN CALCIUM 10 MG PO TABS
10.0000 mg | ORAL_TABLET | Freq: Every day | ORAL | 1 refills | Status: DC
  Filled 2022-07-03 – 2022-07-04 (×2): qty 90, 90d supply, fill #0

## 2022-07-03 NOTE — Patient Instructions (Addendum)
Labs will be obtained today  Sydney Harris will be ordered  Refills on all medication be mailed to your home from [facility]  Referral back to orthopedics and a different pain clinic will be made  Parking sticker form for handicap status was issued take this to the [facility] site  Return to see Dr. Delford Field 4 months

## 2022-07-04 ENCOUNTER — Telehealth: Payer: Self-pay

## 2022-07-04 ENCOUNTER — Other Ambulatory Visit (HOSPITAL_COMMUNITY): Payer: Self-pay

## 2022-07-04 ENCOUNTER — Other Ambulatory Visit: Payer: Self-pay

## 2022-07-04 ENCOUNTER — Encounter: Payer: Self-pay | Admitting: Pharmacist

## 2022-07-04 LAB — COMPREHENSIVE METABOLIC PANEL
ALT: 17 IU/L (ref 0–32)
AST: 19 IU/L (ref 0–40)
Albumin/Globulin Ratio: 1.8
Albumin: 4.7 g/dL (ref 3.8–4.9)
Alkaline Phosphatase: 63 IU/L (ref 44–121)
BUN/Creatinine Ratio: 24 — ABNORMAL HIGH (ref 9–23)
BUN: 20 mg/dL (ref 6–24)
Bilirubin Total: 0.3 mg/dL (ref 0.0–1.2)
CO2: 27 mmol/L (ref 20–29)
Calcium: 10.4 mg/dL — ABNORMAL HIGH (ref 8.7–10.2)
Chloride: 105 mmol/L (ref 96–106)
Creatinine, Ser: 0.84 mg/dL (ref 0.57–1.00)
Globulin, Total: 2.6 g/dL (ref 1.5–4.5)
Glucose: 96 mg/dL (ref 70–99)
Potassium: 4.1 mmol/L (ref 3.5–5.2)
Sodium: 144 mmol/L (ref 134–144)
Total Protein: 7.3 g/dL (ref 6.0–8.5)
eGFR: 80 mL/min/{1.73_m2} (ref >59)

## 2022-07-04 LAB — LIPID PANEL
Chol/HDL Ratio: 2.6 ratio (ref 0.0–4.4)
Cholesterol, Total: 189 mg/dL (ref 100–199)
HDL: 74 mg/dL (ref >39)
LDL Chol Calc (NIH): 100 mg/dL — ABNORMAL HIGH (ref 0–99)
Triglycerides: 86 mg/dL (ref 0–149)
VLDL Cholesterol Cal: 15 mg/dL (ref 5–40)

## 2022-07-04 LAB — HEMOGLOBIN A1C
Est. average glucose Bld gHb Est-mCnc: 134 mg/dL
Hgb A1c MFr Bld: 6.3 % — ABNORMAL HIGH (ref 4.8–5.6)

## 2022-07-04 NOTE — Telephone Encounter (Signed)
Pt was called and is aware of results, DOB was confirmed.  ?

## 2022-07-04 NOTE — Assessment & Plan Note (Addendum)
Type 2 diabetes currently at goal continue lifestyle management alone

## 2022-07-04 NOTE — Telephone Encounter (Signed)
-----   Message from Storm Frisk, MD sent at 07/04/2022  6:17 AM EDT ----- Let patient know liver and kidneys are normal cholesterol remains slightly high hemoglobin A1c is stable, her diabetes is stable and at goal continue metformin She should continue to work on low-fat diet and we will continue with atorvastatin as prescribed

## 2022-07-04 NOTE — Assessment & Plan Note (Signed)
LDL not yet at goal we will need additional adjustments

## 2022-07-04 NOTE — Assessment & Plan Note (Signed)
Severe arthritis left knee referral back to orthopedics I declined to prescribe opiates at this visit

## 2022-07-04 NOTE — Assessment & Plan Note (Signed)
Hypertension under good control electrolytes stable renal function stable continue losartan HCT

## 2022-07-04 NOTE — Progress Notes (Addendum)
Let patient know liver and kidneys are normal cholesterol remains slightly high hemoglobin A1c is stable, her diabetes is stable and at goal continue metformin She should continue to work on low-fat diet and we will continue with atorvastatin as prescribed

## 2022-07-09 ENCOUNTER — Other Ambulatory Visit: Payer: Self-pay

## 2022-07-15 ENCOUNTER — Other Ambulatory Visit (INDEPENDENT_AMBULATORY_CARE_PROVIDER_SITE_OTHER): Payer: Medicare (Managed Care)

## 2022-07-15 ENCOUNTER — Ambulatory Visit (INDEPENDENT_AMBULATORY_CARE_PROVIDER_SITE_OTHER): Payer: Medicare (Managed Care) | Admitting: Orthopaedic Surgery

## 2022-07-15 DIAGNOSIS — M25562 Pain in left knee: Secondary | ICD-10-CM

## 2022-07-15 DIAGNOSIS — G8929 Other chronic pain: Secondary | ICD-10-CM | POA: Diagnosis not present

## 2022-07-15 MED ORDER — OXYCODONE HCL 5 MG PO TABS: 5.0000 mg | ORAL_TABLET | Freq: Every day | ORAL | 0 refills | Status: AC | PRN

## 2022-07-15 NOTE — Progress Notes (Signed)
Office Visit Note   Patient: Sydney Harris           Date of Birth: [birth year]           MRN: [id number] Visit Date: 07/15/2022              Requested by: Storm Frisk, MD [address] [place],  [place] [address] PCP: Storm Frisk, MD   Assessment & Plan: Visit Diagnoses:  1. 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  Patient not interested in steroid injections or knee replacement surgery.  She was dismissed from a pain clinic recently.  I agreed to dispense 6 tablets of oxycodone 5 mg which will not be refilled.  Patient will need to get back in touch with her primary care doctor or find another pain clinic for chronic pain management.  Follow-Up Instructions: No follow-ups on file.   Orders:  Orders Placed This Encounter  Procedures   XR KNEE 3 VIEW LEFT   Meds ordered this encounter  Medications   oxyCODONE (OXY IR/ROXICODONE) 5 MG immediate release tablet    Sig: Take 1-2 tablets (5-10 mg total) by mouth daily as needed for up to 3 days for severe pain.    Dispense:  6 tablet    Refill:  0      Procedures: No procedures performed   Clinical Data: No additional findings.   Subjective: Chief Complaint  Patient presents with   Left Knee - Pain    HPI Patient is a 57 year old female here for chronic severe left knee pain.  Was recently dismissed from a pain clinic.  States that she has severe left knee pain that is worse with limping and walking.  Pain returned on May 30.  Denies any injuries or recent changes in activity. Review of Systems  Constitutional: Negative.   HENT: Negative.    Eyes: Negative.   Respiratory: Negative.    Cardiovascular: Negative.   Endocrine: Negative.   Musculoskeletal: Negative.   Neurological: Negative.   Hematological: Negative.   Psychiatric/Behavioral: Negative.    All other systems reviewed and are  negative.    Objective: Vital Signs: LMP 12/28/2010   Physical Exam Vitals and nursing note reviewed.  Constitutional:      Appearance: She is well-developed.  HENT:     Head: Atraumatic.     Nose: Nose normal.  Eyes:     Extraocular Movements: Extraocular movements intact.  Cardiovascular:     Pulses: Normal pulses.  Pulmonary:     Effort: Pulmonary effort is normal.  Abdominal:     Palpations: Abdomen is soft.  Musculoskeletal:     Cervical back: Neck supple.  Skin:    General: Skin is warm.     Capillary Refill: Capillary refill takes less than 2 seconds.  Neurological:     Mental Status: She is alert. Mental status is at baseline.  Psychiatric:        Behavior: Behavior normal.        Thought Content: Thought content normal.        Judgment: Judgment normal.     Ortho Exam Examination of the left knee shows medial and lateral joint line tenderness.  Trace effusion.  Collaterals and cruciates are stable. Specialty Comments:  No specialty comments available.  Imaging: XR KNEE 3 VIEW LEFT  Result Date: 07/15/2022 X-rays demonstrate advanced tricompartmental degenerative changes  with periarticular spurring.    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   Bipolar 1 disorder (HCC) 10/02/2021   Generalized anxiety disorder 10/02/2021   HTN (hypertension) 03/28/2020   Chronic bilateral low back pain without sciatica 12/28/2019   Hyperlipidemia associated with type 2 diabetes mellitus (HCC) 12/22/2019   Chronic pain of left knee 12/21/2019   Type 2 diabetes mellitus with hyperglycemia, without long-term current use of insulin (HCC) 12/21/2019   Past Medical History:  Diagnosis Date   Anxiety    Arthritis of left knee    Bipolar disorder (HCC)    Blood transfusion 1981   "related to my daughter being born"   Depression    Diabetes mellitus without complication (HCC)    Fibroid tumor     Gout    Hypertension    Plantar fasciitis    Substance abuse (HCC)    crack cocaine  14 yrs sober.    Family History  Problem Relation Age of Onset   Diabetes Father    Asthma Daughter    Heart failure Neg Hx    Cancer Neg Hx    Colon polyps Neg Hx    Colon cancer Neg Hx    Esophageal cancer Neg Hx    Rectal cancer Neg Hx    Stomach cancer Neg Hx     Past Surgical History:  Procedure Laterality Date   ABDOMINAL HYSTERECTOMY     "for fibroid tumors"   Social History   Occupational History   Not on file  Tobacco Use   Smoking status: Former    Packs/day: 0.50    Years: 33.00    Additional pack years: 0.00    Total pack years: 16.50    Types: Cigarettes    Quit date: 2018    Years since quitting: 6.4   Smokeless tobacco: Never  Vaping Use   Vaping Use: Never used  Substance and Sexual Activity   Alcohol use: No   Drug use: Not Currently    Types: "Crack" cocaine, Marijuana    Comment: clean since 2015; currently chews CBD gummies   Sexual activity: Yes    Partners: Male

## 2022-07-17 ENCOUNTER — Telehealth: Payer: Self-pay

## 2022-07-17 ENCOUNTER — Other Ambulatory Visit (HOSPITAL_COMMUNITY): Payer: Self-pay

## 2022-07-17 DIAGNOSIS — G8929 Other chronic pain: Secondary | ICD-10-CM

## 2022-07-17 NOTE — Telephone Encounter (Signed)
atient  states that she has not received her cane and patient  went to ortho doctor and was given only a couple of pills(oxycodone) and was told to contact her provider for more

## 2022-07-18 NOTE — Addendum Note (Signed)
Addended by: Storm Frisk on: 07/18/2022 06:49 AM   Modules accepted: Orders

## 2022-07-18 NOTE — Telephone Encounter (Signed)
Spoke with patient and she is currently seeing [facility] and has also seen [facility]. What other option do we have cause patient is in pain.

## 2022-07-18 NOTE — Telephone Encounter (Signed)
Erskine Squibb can you f/u on cane  I ordered DM 6/13 [place] Let pt know we do not Rx opiates in our clinic I will refer her to pain clinic

## 2022-07-21 NOTE — Telephone Encounter (Signed)
Call placed to patient : [phone] , message left with call back requested

## 2022-07-22 NOTE — Telephone Encounter (Signed)
Noted  

## 2022-07-23 NOTE — Telephone Encounter (Signed)
I tried to reach the patient : [phone] and the voicemail was full.

## 2022-07-29 NOTE — Telephone Encounter (Signed)
I spoke to the patient and informed her that [facility] is still processing the order for the cane.  She then asked about pain medication.  I explained that this clinic does not prescribe opiates. She said she understands but Dr Roda Shutters only gave her 5 pills.  I told her that a referral was placed to [facility] and per Epic notes with the referral , she has an appointment there on 08/04/2022 @ 1200.  She said she could not remember being contacted about an appointment. I told her that I would text her the address and phone number for [facility] and she can call to confirm her appointment. She was in agreement and I text her the information as she requested.

## 2022-07-30 ENCOUNTER — Telehealth: Payer: Self-pay

## 2022-07-30 NOTE — Telephone Encounter (Signed)
I spoke to [facility] and she confirmed that the cane was drop shipped and delivered to the patient on 07/29/2022.

## 2022-08-07 ENCOUNTER — Telehealth: Payer: Self-pay | Admitting: Critical Care Medicine

## 2022-08-07 DIAGNOSIS — G8929 Other chronic pain: Secondary | ICD-10-CM

## 2022-08-07 NOTE — Assessment & Plan Note (Signed)
Disrespectful at [facility] and pt discharged from practice

## 2022-08-07 NOTE — Telephone Encounter (Signed)
Received certified letter from [facility]  He was rude and disrespectful with staff and discharged from their practice   File letter please

## 2022-08-08 NOTE — Telephone Encounter (Signed)
Noted, letter put in scan

## 2022-08-13 ENCOUNTER — Encounter: Payer: Self-pay | Admitting: Critical Care Medicine

## 2022-08-13 ENCOUNTER — Ambulatory Visit: Payer: Medicare (Managed Care) | Attending: Critical Care Medicine | Admitting: Critical Care Medicine

## 2022-08-13 VITALS — BP 119/79 | HR 50 | Wt 199.2 lb

## 2022-08-13 DIAGNOSIS — M1712 Unilateral primary osteoarthritis, left knee: Secondary | ICD-10-CM | POA: Diagnosis not present

## 2022-08-13 DIAGNOSIS — Z79899 Other long term (current) drug therapy: Secondary | ICD-10-CM | POA: Diagnosis not present

## 2022-08-13 DIAGNOSIS — I10 Essential (primary) hypertension: Secondary | ICD-10-CM | POA: Insufficient documentation

## 2022-08-13 DIAGNOSIS — F419 Anxiety disorder, unspecified: Secondary | ICD-10-CM | POA: Diagnosis not present

## 2022-08-13 DIAGNOSIS — Z760 Encounter for issue of repeat prescription: Secondary | ICD-10-CM | POA: Diagnosis not present

## 2022-08-13 DIAGNOSIS — Z7984 Long term (current) use of oral hypoglycemic drugs: Secondary | ICD-10-CM | POA: Insufficient documentation

## 2022-08-13 DIAGNOSIS — M25562 Pain in left knee: Secondary | ICD-10-CM | POA: Diagnosis not present

## 2022-08-13 DIAGNOSIS — G8929 Other chronic pain: Secondary | ICD-10-CM | POA: Diagnosis not present

## 2022-08-13 DIAGNOSIS — Z87891 Personal history of nicotine dependence: Secondary | ICD-10-CM | POA: Insufficient documentation

## 2022-08-13 DIAGNOSIS — F1290 Cannabis use, unspecified, uncomplicated: Secondary | ICD-10-CM | POA: Insufficient documentation

## 2022-08-13 DIAGNOSIS — M109 Gout, unspecified: Secondary | ICD-10-CM | POA: Insufficient documentation

## 2022-08-13 DIAGNOSIS — E1165 Type 2 diabetes mellitus with hyperglycemia: Secondary | ICD-10-CM | POA: Insufficient documentation

## 2022-08-13 DIAGNOSIS — F319 Bipolar disorder, unspecified: Secondary | ICD-10-CM | POA: Insufficient documentation

## 2022-08-13 MED ORDER — PREDNISONE 10 MG PO TABS: ORAL_TABLET | ORAL | 1 refills | Status: DC

## 2022-08-13 NOTE — Progress Notes (Signed)
Established Patient Office Visit  Subjective:  Patient ID: Sydney Harris, female    DOB: 05-07-1965  Age: 57 y.o. MRN: [id number]  CC:  Chief Complaint  Patient presents with   Hypertension    HPI Sydney Harris presents for diabetes follow-up.   12/2020  On arrival blood pressure 113/78 glucose 133.  Patient does have an eye appointment coming up in January.  Patient does see pain management and orthopedics for her chronic knee pain.  She is on periodic oxycodone.  Recent work-up laboratory wise is noted below from the pain clinic  We offered pneumonia vaccine she wishes to think about that  Vit D 52.5 nl CBC Normal RA factor < 7 Drug screen pos oxycodone/hydrocodone only ANA NEG CMET normal  Cr 0.9  K 3.7   LFT normal  Ca 10.3 ESR 49 slightly high The patient has been losing weight she is lost up to 20 pounds with dietary changes is improved her blood pressure control and diabetes significantly.  Her last A1c was 6.4  9/13   Patient seen in return follow-up arrival blood sugar 166 blood pressure 109/70.  Patient does not declined to receive any vaccines at this visit.  Patient has been doing very well overall blood sugars at home anywhere from 90-120.  She is going to [facility] pain clinic receives Percocet 4 times a day for chronic left knee pain.  She needs follow-up labs at this visit.  She does not have a mental health provider she does take chronic medications for bipolar disorder.  She been compliant with all her diabetic medications at this time.  02/12/22 Patient seen in return follow-up overall doing well hemoglobin A1c is 6 and blood pressure is good 102/66. Patient does follow for chronic pain management and the pain clinic [facility].  Patient also has bipolar disorder as followed by mental health needs refills on her Abilify.  Patient is followed by ophthalmology recent eye exam was normal except for glaucoma she is on eyedrops.   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  She is asking for opiates at this visit.  The pain is increasing the left leg she is previously seen orthopedics they recommended knee replacement she declined.  Patient has history of type 2 diabetes A1c today is 6.3 blood sugar is well-controlled and patient is maintaining metformin Patient also with hypertension on arrival today blood pressure good 122/79 patient i on losartan HCT  patient has history of bipolar disorder currently stable on Abilify and Cogentin Prior history of latent syphilis stable at this time Patient requesting a prescription for a cane she has difficulty walking and also handicap placard  7/24 This patient seen in return follow-up and has severe bilateral knee pain and is needing a knee replacement on the left.  She had declined surgery because she had no caregiver to help her after surgery.  She was seen by pain management clinic who discharged her because she had marijuana in the urine.  She is stating she went and bought a gummy cannabinoid supply from a local herbal store that likely cross reference to marijuana.  Patient is needing another pain  management referral.  She has physical therapy upcoming.  On arrival blood pressure is good. Past Medical History:  Diagnosis Date   Anxiety    Arthritis of left knee    Bipolar disorder (HCC)    Blood transfusion 1981   "related to my daughter being born"   Depression    Diabetes mellitus without complication (HCC)    Fibroid tumor    Gout    Hypertension    Plantar fasciitis    Substance abuse (HCC)    crack cocaine  14 yrs sober.    Past Surgical  History:  Procedure Laterality Date   ABDOMINAL HYSTERECTOMY     "for fibroid tumors"    Family History  Problem Relation Age of Onset   Diabetes Father    Asthma Daughter    Heart failure Neg Hx    Cancer Neg Hx    Colon polyps Neg Hx    Colon cancer Neg Hx    Esophageal cancer Neg Hx    Rectal cancer Neg Hx    Stomach cancer Neg Hx     Social History   Socioeconomic History   Marital status: Single    Spouse name: Not on file   Number of children: Not on file   Years of education: Not on file   Highest education level: 9th grade  Occupational History   Not on file  Tobacco Use   Smoking status: Former    Current packs/day: 0.00    Average packs/day: 0.5 packs/day for 33.0 years (16.5 ttl pk-yrs)    Types: Cigarettes    Start date: 64    Quit date: 2018    Years since quitting: 6.5   Smokeless tobacco: Never  Vaping Use   Vaping status: Never Used  Substance and Sexual Activity   Alcohol use: No   Drug use: Not Currently    Types: "Crack" cocaine, Marijuana    Comment: clean since 2015; currently chews CBD gummies   Sexual activity: Yes    Partners: Male  Other Topics Concern   Not on file  Social History Narrative   Not on file   Social Determinants of Health   Financial Resource Strain: Low Risk  (06/07/2021)   Overall Financial Resource Strain (CARDIA)    Difficulty of Paying Living Expenses: Not hard at all  Food Insecurity: No Food Insecurity (06/07/2021)   Hunger Vital Sign    Worried About Running Out of Food in the Last Year: Never true    Ran Out of Food in the Last Year: Never true  Transportation Needs: No Transportation Needs (06/07/2021)   PRAPARE - Administrator, Civil Service (Medical): No    Lack of Transportation (Non-Medical): No  Physical Activity: Sufficiently Active (06/07/2021)   Exercise Vital Sign    Days of Exercise per Week: 3 days    Minutes of Exercise per Session: 60 min  Stress: No Stress Concern Present  (06/07/2021)   [employer] of Occupational Health - Occupational Stress Questionnaire    Feeling of Stress : Not at all  Social Connections: Moderately Isolated (06/07/2021)   Social Connection and Isolation Panel [NHANES]    Frequency of Communication with Friends and Family: Twice a week    Frequency of Social Gatherings with Friends and Family: More than three times a week    Attends Religious Services: More than 4 times per year    Active Member of [employer] or Organizations: No    Attends Club or  Organization Meetings: Never    Marital Status: Never married  Intimate Partner Violence: Not At Risk (06/07/2021)   Humiliation, Afraid, Rape, and Kick questionnaire    Fear of Current or Ex-Partner: No    Emotionally Abused: No    Physically Abused: No    Sexually Abused: No    Outpatient Medications Prior to Visit  Medication Sig Dispense Refill   Accu-Chek Softclix Lancets lancets Use to measure blood sugar twice a day. E11.65 100 each 6   ARIPiprazole (ABILIFY) 15 MG tablet Take 1 tablet (15 mg total) by mouth at bedtime. 30 tablet 4   atorvastatin (LIPITOR) 10 MG tablet Take 1 tablet (10 mg total) by mouth daily. 90 tablet 1   baclofen (LIORESAL) 10 MG tablet Take 1 tablet (10 mg total) by mouth 3 (three) times daily. 90 each 3   benztropine (COGENTIN) 0.5 MG tablet Take 1 tablet (0.5 mg total) by mouth at bedtime. 30 tablet 4   Blood Glucose Monitoring Suppl (ACCU-CHEK GUIDE ME) w/Device KIT Use to measure blood sugar twice a day. E11.65 1 kit 0   escitalopram (LEXAPRO) 5 MG tablet Take 1 tablet (5 mg total) by mouth daily. 30 tablet 4   gabapentin (NEURONTIN) 300 MG capsule Take 1 capsule (300 mg total) by mouth 3 (three) times daily. 90 capsule 2   glucose blood (ACCU-CHEK GUIDE) test strip Use to measure blood sugar twice a day. E11.65 100 each 6   latanoprost (XALATAN) 0.005 % ophthalmic solution Place 1 drop into both eyes at bedtime.     losartan-hydrochlorothiazide (HYZAAR)  50-12.5 MG tablet Take 1 tablet by mouth daily. 90 tablet 2   metFORMIN (GLUCOPHAGE) 500 MG tablet Take 1 tablet (500 mg total) by mouth daily with breakfast. 90 tablet 3   Misc. Devices (CANE) MISC 1 each by Does not apply route daily as needed. 1 each 0   Multiple Vitamin (MULTIVITAMIN ADULT PO) Take by mouth daily.     Multiple Vitamins-Minerals (HAIR SKIN AND NAILS FORMULA PO) Take by mouth daily.     naloxone (NARCAN) nasal spray 4 mg/0.1 mL SMARTSIG:Both Nares     omeprazole (PRILOSEC) 40 MG capsule Take 1 capsule (40 mg total) by mouth daily. 60 capsule 3   traZODone (DESYREL) 100 MG tablet TAKE 1 & 1/2 (ONE & ONE-HALF) TABLETS BY MOUTH AT BEDTIME AS NEEDED 45 tablet 1   Vitamin D, Ergocalciferol, (DRISDOL) 1.25 MG (50000 UNIT) CAPS capsule Take 1 capsule (50,000 Units total) by mouth once a week. 12 capsule 4   oxyCODONE-acetaminophen (PERCOCET) 10-325 MG tablet Take 1 tablet by mouth 4 (four) times daily as needed.     oxyCODONE-acetaminophen (PERCOCET) 7.5-325 MG tablet Take 1 tablet by mouth 4 (four) times daily as needed.     No facility-administered medications prior to visit.    Allergies  Allergen Reactions   Penicillins Hives    Has patient had a PCN reaction causing immediate rash, facial/tongue/throat swelling, SOB or lightheadedness with hypotension: Yes Has patient had a PCN reaction causing severe rash involving mucus membranes or skin necrosis: Yes Has patient had a PCN reaction that required hospitalization No Has patient had a PCN reaction occurring within the last 10 years: Yes If all of the above answers are "NO", then may proceed with Cephalosporin use.    Tomato     hives    ROS Review of Systems  Constitutional: Negative.   HENT: Negative.  Negative for ear pain, postnasal drip, rhinorrhea, sinus pressure,  sore throat, trouble swallowing and voice change.   Eyes: Negative.   Respiratory: Negative.  Negative for apnea, cough, choking, chest tightness,  shortness of breath, wheezing and stridor.   Cardiovascular: Negative.  Negative for chest pain, palpitations and leg swelling.  Gastrointestinal: Negative.  Negative for abdominal distention, abdominal pain, nausea and vomiting.  Genitourinary: Negative.   Musculoskeletal:  Positive for joint swelling. Negative for arthralgias and myalgias.       Chronic left knee pain  Skin: Negative.  Negative for rash.  Allergic/Immunologic: Negative.  Negative for environmental allergies and food allergies.  Neurological: Negative.  Negative for dizziness, syncope, weakness and headaches.  Hematological: Negative.  Negative for adenopathy. Does not bruise/bleed easily.  Psychiatric/Behavioral: Negative.  Negative for agitation and sleep disturbance. The patient is not nervous/anxious.       Objective:    Physical Exam Vitals reviewed.  Constitutional:      Appearance: Normal appearance. She is well-developed. She is obese. She is not diaphoretic.  HENT:     Head: Normocephalic and atraumatic.     Nose: No nasal deformity, septal deviation, mucosal edema or rhinorrhea.     Right Sinus: No maxillary sinus tenderness or frontal sinus tenderness.     Left Sinus: No maxillary sinus tenderness or frontal sinus tenderness.     Mouth/Throat:     Pharynx: No oropharyngeal exudate.  Eyes:     General: No scleral icterus.    Conjunctiva/sclera: Conjunctivae normal.     Pupils: Pupils are equal, round, and reactive to light.  Neck:     Thyroid: No thyromegaly.     Vascular: No carotid bruit or JVD.     Trachea: Trachea normal. No tracheal tenderness or tracheal deviation.  Cardiovascular:     Rate and Rhythm: Normal rate and regular rhythm.     Chest Wall: PMI is not displaced.     Pulses: Normal pulses. No decreased pulses.     Heart sounds: Normal heart sounds, S1 normal and S2 normal. Heart sounds not distant. No murmur heard.    No systolic murmur is present.     No diastolic murmur is present.      No friction rub. No gallop. No S3 or S4 sounds.  Pulmonary:     Effort: No tachypnea, accessory muscle usage or respiratory distress.     Breath sounds: No stridor. No decreased breath sounds, wheezing, rhonchi or rales.  Chest:     Chest wall: No tenderness.  Abdominal:     General: Bowel sounds are normal. There is no distension.     Palpations: Abdomen is soft. Abdomen is not rigid.     Tenderness: There is no abdominal tenderness. There is no guarding or rebound.  Musculoskeletal:        General: Tenderness present. Normal range of motion.     Cervical back: Normal range of motion and neck supple. No edema, erythema or rigidity. No muscular tenderness. Normal range of motion.     Comments: Medial tenderness of the left knee foot exam normal  Lymphadenopathy:     Head:     Right side of head: No submental or submandibular adenopathy.     Left side of head: No submental or submandibular adenopathy.     Cervical: No cervical adenopathy.  Skin:    General: Skin is warm and dry.     Coloration: Skin is not pale.     Findings: No rash.     Nails: There is no clubbing.  Neurological:     Mental Status: She is alert and oriented to person, place, and time.     Sensory: No sensory deficit.  Psychiatric:        Speech: Speech normal.        Behavior: Behavior normal.     BP 119/79   Pulse (!) 50   Wt 199 lb 3.2 oz (90.4 kg)   LMP 12/28/2010   SpO2 100%   BMI 35.29 kg/m  Wt Readings from Last 3 Encounters:  08/13/22 199 lb 3.2 oz (90.4 kg)  07/03/22 195 lb (88.5 kg)  02/12/22 194 lb (88 kg)     Health Maintenance Due  Topic Date Due   DTaP/Tdap/Td (1 - Tdap) Never done   COVID-19 Vaccine (3 - 2023-24 season) 09/20/2021   Medicare Annual Wellness (AWV)  06/08/2022    There are no preventive care reminders to display for this patient.  No results found for: "TSH" Lab Results  Component Value Date   WBC 8.4 10/02/2021   HGB 13.4 10/02/2021   HCT 40.5 10/02/2021    MCV 87 10/02/2021   PLT 426 10/02/2021   Lab Results  Component Value Date   NA 144 07/03/2022   K 4.1 07/03/2022   CO2 27 07/03/2022   GLUCOSE 96 07/03/2022   BUN 20 07/03/2022   CREATININE 0.84 07/03/2022   BILITOT 0.3 07/03/2022   ALKPHOS 63 07/03/2022   AST 19 07/03/2022   ALT 17 07/03/2022   PROT 7.3 07/03/2022   ALBUMIN 4.7 07/03/2022   CALCIUM 10.4 (H) 07/03/2022   ANIONGAP 10 02/08/2016   EGFR 80 07/03/2022   Lab Results  Component Value Date   CHOL 189 07/03/2022   Lab Results  Component Value Date   HDL 74 07/03/2022   Lab Results  Component Value Date   LDLCALC 100 (H) 07/03/2022   Lab Results  Component Value Date   TRIG 86 07/03/2022   Lab Results  Component Value Date   CHOLHDL 2.6 07/03/2022   Lab Results  Component Value Date   HGBA1C 6.3 (H) 07/03/2022      Assessment & Plan:   Problem List Items Addressed This Visit       Cardiovascular and Mediastinum   HTN (hypertension)    Under control at this time no change in medication        Endocrine   Type 2 diabetes mellitus with hyperglycemia, without long-term current use of insulin (HCC)    Blood sugars under control no change in medication        Other   Chronic pain of left knee - Primary    Has severe osteoarthritis left knee I told the patient I cannot give opiates at this time.  Plan is to give prednisone for short course and recommended Tylenol Extra Strength 2-4 times a day and icing the knee.  She is to get back in with orthopedics.  I sent the referral for this.  I told her likely she will need short-term nursing home placement for care since she does not have a caregiver.      Relevant Medications   predniSONE (DELTASONE) 10 MG tablet   Other Relevant Orders   Ambulatory referral to Pain Clinic    Meds ordered this encounter  Medications   predniSONE (DELTASONE) 10 MG tablet    Sig: Take 4 daily for 2 days, 3 daily 2days 2 daily for 2 days then 1 daily and  stay    Dispense:  60 tablet    Refill:  1    Follow-up: Return in about 3 months (around 11/13/2022) for primary care follow up.    Shan Levans, MD

## 2022-08-13 NOTE — Assessment & Plan Note (Signed)
Under control at this time no change in medication

## 2022-08-13 NOTE — Patient Instructions (Signed)
Prednisone will be given for your knee inflammation  Second opinion pain clinic will be made as a referral  Please call orthopedics and give consideration to proceed with knee replacement you likely would need a skilled nursing facility to help rehab you as you know do not have a caregiver  Take Tylenol extra strength to 500 mg tablets 4 times daily every day on schedule  Use ibuprofen 2 to 400 mg every 6 hours as needed for breakthrough pain  Apply ice to the knee 2-3 times daily  Go to physical therapy as you are scheduled for muscle strengthening  No change in any of your other medications you are at goal with your diabetes and high blood pressure  A new primary care provider will be assigned to you

## 2022-08-13 NOTE — Assessment & Plan Note (Signed)
Has severe osteoarthritis left knee I told the patient I cannot give opiates at this time.  Plan is to give prednisone for short course and recommended Tylenol Extra Strength 2-4 times a day and icing the knee.  She is to get back in with orthopedics.  I sent the referral for this.  I told her likely she will need short-term nursing home placement for care since she does not have a caregiver.

## 2022-08-13 NOTE — Assessment & Plan Note (Signed)
Blood sugars under control no change in medication

## 2022-09-04 ENCOUNTER — Other Ambulatory Visit: Payer: Self-pay | Admitting: Critical Care Medicine

## 2022-09-08 ENCOUNTER — Encounter (HOSPITAL_COMMUNITY): Payer: Self-pay

## 2022-09-08 ENCOUNTER — Ambulatory Visit (HOSPITAL_COMMUNITY)
Admission: EM | Admit: 2022-09-08 | Discharge: 2022-09-08 | Disposition: A | Discharge status: Home / Self Care | Payer: Medicare (Managed Care) | Attending: Physician Assistant | Admitting: Physician Assistant

## 2022-09-08 DIAGNOSIS — G8929 Other chronic pain: Secondary | ICD-10-CM

## 2022-09-08 DIAGNOSIS — M79645 Pain in left finger(s): Secondary | ICD-10-CM

## 2022-09-08 DIAGNOSIS — E1165 Type 2 diabetes mellitus with hyperglycemia: Secondary | ICD-10-CM

## 2022-09-08 DIAGNOSIS — M25562 Pain in left knee: Secondary | ICD-10-CM | POA: Diagnosis not present

## 2022-09-08 DIAGNOSIS — M79644 Pain in right finger(s): Secondary | ICD-10-CM

## 2022-09-08 MED ORDER — PREDNISONE 20 MG PO TABS: 40.0000 mg | ORAL_TABLET | Freq: Every day | ORAL | 0 refills | Status: AC

## 2022-09-08 NOTE — ED Provider Notes (Signed)
[facility]    CSN: [id number] Arrival date & time: 09/08/22  7829      History   Chief Complaint Chief Complaint  Patient presents with   Medication Refill   finger swelling    HPI Sydney Harris is a 57 y.o. female.   Patient here today for evaluation of pain in her left index and right fourth finger that started about 3 weeks ago.  She states that she has had some swelling to the distal joints.  She does have known arthritis.  She denies any injury.  She reports some numbness and tingling in her fingers at times.  She does not report treatment for symptoms other than oxycodone last night.  She also request refill of prednisone for chronic left knee pain.  She has not had any new injury to her knee.  The history is provided by the patient.    Past Medical History:  Diagnosis Date   Anxiety    Arthritis of left knee    Bipolar disorder (HCC)    Blood transfusion 1981   "related to my daughter being born"   Depression    Diabetes mellitus without complication (HCC)    Fibroid tumor    Gout    Hypertension    Plantar fasciitis    Substance abuse (HCC)    crack cocaine  14 yrs sober.    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      Home Medications    Prior to Admission medications   Medication Sig Start Date End Date Taking? Authorizing  Provider  predniSONE (DELTASONE) 20 MG tablet Take 2 tablets (40 mg total) by mouth daily with breakfast for 5 days. 09/08/22 09/13/22 Yes Tomi Bamberger, PA-C  Accu-Chek Softclix Lancets lancets Use to measure blood sugar twice a day. E11.65 12/26/19   Storm Frisk, MD  ARIPiprazole (ABILIFY) 15 MG tablet Take 1 tablet (15 mg total) by mouth at bedtime. 07/03/22   Storm Frisk, MD  atorvastatin (LIPITOR) 10 MG tablet Take 1 tablet by mouth once daily 09/04/22   Storm Frisk, MD  baclofen (LIORESAL) 10 MG tablet Take 1 tablet (10 mg total) by mouth 3 (three) times daily. 07/03/22   Storm Frisk, MD  benztropine (COGENTIN) 0.5 MG tablet Take 1 tablet (0.5 mg total) by mouth at bedtime. 07/03/22   Storm Frisk, MD  Blood Glucose Monitoring Suppl (ACCU-CHEK GUIDE ME) w/Device KIT Use to measure blood sugar twice a day. E11.65 12/26/19   Storm Frisk, MD  escitalopram (LEXAPRO) 5 MG tablet Take 1 tablet (5 mg total) by mouth daily. 07/03/22   Storm Frisk, MD  gabapentin (NEURONTIN) 300 MG capsule Take 1 capsule (300 mg total) by mouth 3 (three) times daily. 07/03/22   Storm Frisk, MD  glucose blood (ACCU-CHEK GUIDE) test strip Use  to measure blood sugar twice a day. E11.65 12/26/19   Storm Frisk, MD  latanoprost (XALATAN) 0.005 % ophthalmic solution Place 1 drop into both eyes at bedtime. 01/29/22   [provider]  losartan-hydrochlorothiazide (HYZAAR) 50-12.5 MG tablet Take 1 tablet by mouth daily. 07/03/22   Storm Frisk, MD  metFORMIN (GLUCOPHAGE) 500 MG tablet Take 1 tablet (500 mg total) by mouth daily with breakfast. 07/03/22   Storm Frisk, MD  Multiple Vitamin (MULTIVITAMIN ADULT PO) Take by mouth daily.    [provider]  Multiple Vitamins-Minerals (HAIR SKIN AND NAILS FORMULA PO) Take by mouth daily.    [provider]  naloxone [facility]) nasal spray 4 mg/0.1 mL SMARTSIG:Both Nares 09/21/20   [provider]   omeprazole (PRILOSEC) 40 MG capsule Take 1 capsule (40 mg total) by mouth daily. 07/03/22   Storm Frisk, MD  traZODone (DESYREL) 100 MG tablet TAKE 1 & 1/2 (ONE & ONE-HALF) TABLETS BY MOUTH AT BEDTIME AS NEEDED 07/03/22   Storm Frisk, MD  Vitamin D, Ergocalciferol, (DRISDOL) 1.25 MG (50000 UNIT) CAPS capsule Take 1 capsule (50,000 Units total) by mouth once a week. 07/03/22   Storm Frisk, MD    Family History Family History  Problem Relation Age of Onset   Diabetes Father    Asthma Daughter    Heart failure Neg Hx    Cancer Neg Hx    Colon polyps Neg Hx    Colon cancer Neg Hx    Esophageal cancer Neg Hx    Rectal cancer Neg Hx    Stomach cancer Neg Hx     Social History Social History   Tobacco Use   Smoking status: Former    Current packs/day: 0.00    Average packs/day: 0.5 packs/day for 33.0 years (16.5 ttl pk-yrs)    Types: Cigarettes    Start date: 65    Quit date: 2018    Years since quitting: 6.6   Smokeless tobacco: Never  Vaping Use   Vaping status: Never Used  Substance Use Topics   Alcohol use: No   Drug use: Not Currently    Types: "Crack" cocaine, Marijuana    Comment: clean since 2015; currently chews CBD gummies     Allergies   Penicillins and Tomato   Review of Systems Review of Systems  Constitutional:  Negative for chills and fever.  Eyes:  Negative for discharge and redness.  Gastrointestinal:  Negative for nausea and vomiting.  Musculoskeletal:  Positive for arthralgias and joint swelling.  Neurological:  Negative for numbness.     Physical Exam Triage Vital Signs ED Triage Vitals  Encounter Vitals Group     BP      Systolic BP Percentile      Diastolic BP Percentile      Pulse      Resp      Temp      Temp src      SpO2      Weight      Height      Head Circumference      Peak Flow      Pain Score      Pain Loc      Pain Education      Exclude from Growth Chart    No data found.  Updated Vital  Signs BP 129/85 (BP Location: Right Arm)   Pulse 63   Temp (!) 97.5 F (36.4 C) (Oral)   Resp  16   LMP 12/28/2010   SpO2 99%     Physical Exam Vitals and nursing note reviewed.  Constitutional:      General: She is not in acute distress.    Appearance: Normal appearance. She is not ill-appearing.  HENT:     Head: Normocephalic and atraumatic.  Eyes:     Conjunctiva/sclera: Conjunctivae normal.  Cardiovascular:     Rate and Rhythm: Normal rate.  Pulmonary:     Effort: Pulmonary effort is normal. No respiratory distress.  Musculoskeletal:     Comments: Mild swelling appreciated to multiple joints stable.  No erythema.  Neurological:     Mental Status: She is alert.     Comments: Gross sensation intact to distal fingertips bilaterally  Psychiatric:        Mood and Affect: Mood normal.        Behavior: Behavior normal.        Thought Content: Thought content normal.      UC Treatments / Results  Labs (all labs ordered are listed, but only abnormal results are displayed) Labs Reviewed - No data to display  EKG   Radiology No results found.  Procedures Procedures (including critical care time)  Medications Ordered in UC Medications - No data to display  Initial Impression / Assessment and Plan / UC Course  I have reviewed the triage vital signs and the nursing notes.  Pertinent labs & imaging results that were available during my care of the patient were reviewed by me and considered in my medical decision making (see chart for details).    Will treat to cover possible arthritis with steroid burst.  Discussed that this may elevate glucose given diabetes.  Patient reports that she has taken prednisone in the past with no issues but will monitor.  Encouraged follow-up if no gradual improvement with any further concerns.  Imaging deferred today given lack of injury.  Final Clinical Impressions(s) / UC Diagnoses   Final diagnoses:  Chronic pain of left knee   Pain in finger of both hands  Type 2 diabetes mellitus with hyperglycemia, without long-term current use of insulin (HCC)     Discharge Instructions      Follow up if no improvement of finger pain or with any further concerns.      ED Prescriptions     Medication Sig Dispense Auth. Provider   predniSONE (DELTASONE) 20 MG tablet Take 2 tablets (40 mg total) by mouth daily with breakfast for 5 days. 10 tablet Tomi Bamberger, PA-C      PDMP not reviewed this encounter.   Tomi Bamberger, PA-C 09/08/22 [phone]

## 2022-09-08 NOTE — Discharge Instructions (Signed)
Follow up if no improvement of finger pain or with any further concerns.

## 2022-09-08 NOTE — ED Triage Notes (Addendum)
Patient c/o left pointer and right ring finger swelling x 3 weeks.  Patient states she took Oxycodone last night.   Patient added that she needs a refill on her Prednisone.

## 2022-09-10 ENCOUNTER — Ambulatory Visit: Payer: Medicare (Managed Care) | Attending: Critical Care Medicine

## 2022-09-10 VITALS — Ht 63.0 in | Wt 199.0 lb

## 2022-09-10 DIAGNOSIS — Z0000 Encounter for general adult medical examination without abnormal findings: Secondary | ICD-10-CM

## 2022-09-10 DIAGNOSIS — H9190 Unspecified hearing loss, unspecified ear: Secondary | ICD-10-CM

## 2022-09-10 NOTE — Progress Notes (Signed)
Subjective:   Sydney Harris is a 57 y.o. female who presents for Medicare Annual (Subsequent) preventive examination.  Visit Complete: Virtual  I connected with  Sydney Harris on 09/10/22 by a audio enabled telemedicine application and verified that I am speaking with the correct person using two identifiers.  Patient Location: Home  Provider Location: Home Office  I discussed the limitations of evaluation and management by telemedicine. The patient expressed understanding and agreed to proceed.  Vital Signs: Because this visit was a virtual/telehealth visit, some criteria may be missing or patient reported. Any vitals not documented were not able to be obtained and vitals that have been documented are patient reported.   Review of Systems     Cardiac Risk Factors include: diabetes mellitus;hypertension;dyslipidemia;sedentary lifestyle;smoking/ tobacco exposure     Objective:    Today's Vitals   09/10/22 1211  Weight: 199 lb (90.3 kg)  Height: 5\' 3"  (1.6 m)   Body mass index is 35.25 kg/m.     09/10/2022    9:03 PM 11/30/2021    8:13 AM 08/17/2020    9:28 AM 01/27/2016    2:44 AM 06/30/2013   10:19 PM  Advanced Directives  Does Patient Have a Medical Advance Directive? No No No No Patient does not have advance directive;Patient would like information  Would patient like information on creating a medical advance directive? Yes (MAU/Ambulatory/Procedural Areas - Information given) Yes (MAU/Ambulatory/Procedural Areas - Information given) No - Patient declined  Advance directive packet given  Pre-existing out of facility DNR order (yellow form or pink MOST form)     No    Current Medications (verified) Outpatient Encounter Medications as of 09/10/2022  Medication Sig   Accu-Chek Softclix Lancets lancets Use to measure blood sugar twice a day. E11.65   ARIPiprazole (ABILIFY) 15 MG tablet Take 1 tablet (15 mg total) by mouth at bedtime.   atorvastatin (LIPITOR)  10 MG tablet Take 1 tablet by mouth once daily   baclofen (LIORESAL) 10 MG tablet Take 1 tablet (10 mg total) by mouth 3 (three) times daily.   Blood Glucose Monitoring Suppl (ACCU-CHEK GUIDE ME) w/Device KIT Use to measure blood sugar twice a day. E11.65   escitalopram (LEXAPRO) 5 MG tablet Take 1 tablet (5 mg total) by mouth daily.   gabapentin (NEURONTIN) 300 MG capsule Take 1 capsule (300 mg total) by mouth 3 (three) times daily.   glucose blood (ACCU-CHEK GUIDE) test strip Use to measure blood sugar twice a day. E11.65   latanoprost (XALATAN) 0.005 % ophthalmic solution Place 1 drop into both eyes at bedtime.   losartan-hydrochlorothiazide (HYZAAR) 50-12.5 MG tablet Take 1 tablet by mouth daily.   metFORMIN (GLUCOPHAGE) 500 MG tablet Take 1 tablet (500 mg total) by mouth daily with breakfast.   Multiple Vitamin (MULTIVITAMIN ADULT PO) Take by mouth daily.   Multiple Vitamins-Minerals (HAIR SKIN AND NAILS FORMULA PO) Take by mouth daily.   naloxone (NARCAN) nasal spray 4 mg/0.1 mL SMARTSIG:Both Nares   omeprazole (PRILOSEC) 40 MG capsule Take 1 capsule (40 mg total) by mouth daily.   predniSONE (DELTASONE) 20 MG tablet Take 2 tablets (40 mg total) by mouth daily with breakfast for 5 days.   traZODone (DESYREL) 100 MG tablet TAKE 1 & 1/2 (ONE & ONE-HALF) TABLETS BY MOUTH AT BEDTIME AS NEEDED   Vitamin D, Ergocalciferol, (DRISDOL) 1.25 MG (50000 UNIT) CAPS capsule Take 1 capsule (50,000 Units total) by mouth once a week.   benztropine (COGENTIN) 0.5 MG  tablet Take 1 tablet (0.5 mg total) by mouth at bedtime. (Patient not taking: Reported on 09/10/2022)   No facility-administered encounter medications on file as of 09/10/2022.    Allergies (verified) Penicillins and Tomato   History: Past Medical History:  Diagnosis Date   Anxiety    Arthritis of left knee    Bipolar disorder (HCC)    Blood transfusion 1981   "related to my daughter being born"   Depression    Diabetes mellitus  without complication (HCC)    Fibroid tumor    Gout    Hypertension    Plantar fasciitis    Substance abuse (HCC)    crack cocaine  14 yrs sober.   Past Surgical History:  Procedure Laterality Date   ABDOMINAL HYSTERECTOMY     "for fibroid tumors"   Family History  Problem Relation Age of Onset   Diabetes Father    Asthma Daughter    Heart failure Neg Hx    Cancer Neg Hx    Colon polyps Neg Hx    Colon cancer Neg Hx    Esophageal cancer Neg Hx    Rectal cancer Neg Hx    Stomach cancer Neg Hx    Social History   Socioeconomic History   Marital status: Single    Spouse name: Not on file   Number of children: Not on file   Years of education: Not on file   Highest education level: 9th grade  Occupational History   Not on file  Tobacco Use   Smoking status: Former    Current packs/day: 0.00    Average packs/day: 0.5 packs/day for 33.0 years (16.5 ttl pk-yrs)    Types: Cigarettes    Start date: 6    Quit date: 2018    Years since quitting: 6.6   Smokeless tobacco: Never  Vaping Use   Vaping status: Never Used  Substance and Sexual Activity   Alcohol use: No   Drug use: Not Currently    Types: "Crack" cocaine, Marijuana    Comment: clean since 2015; currently chews CBD gummies   Sexual activity: Yes    Partners: Male  Other Topics Concern   Not on file  Social History Narrative   Not on file   Social Determinants of Health   Financial Resource Strain: Low Risk  (09/10/2022)   Overall Financial Resource Strain (CARDIA)    Difficulty of Paying Living Expenses: Not hard at all  Food Insecurity: No Food Insecurity (09/10/2022)   Hunger Vital Sign    Worried About Running Out of Food in the Last Year: Never true    Ran Out of Food in the Last Year: Never true  Transportation Needs: No Transportation Needs (09/10/2022)   PRAPARE - Administrator, Civil Service (Medical): No    Lack of Transportation (Non-Medical): No  Physical Activity: Inactive  (09/10/2022)   Exercise Vital Sign    Days of Exercise per Week: 0 days    Minutes of Exercise per Session: 0 min  Stress: No Stress Concern Present (09/10/2022)   [employer] of Occupational Health - Occupational Stress Questionnaire    Feeling of Stress : Not at all  Social Connections: Moderately Isolated (09/10/2022)   Social Connection and Isolation Panel [NHANES]    Frequency of Communication with Friends and Family: Twice a week    Frequency of Social Gatherings with Friends and Family: Three times a week    Attends Religious Services: More than 4 times  per year    Active Member of Clubs or Organizations: No    Attends Banker Meetings: Never    Marital Status: Never married    Tobacco Counseling Counseling given: Not Answered   Clinical Intake:  Pre-visit preparation completed: Yes  Pain : No/denies pain     Diabetes: Yes CBG done?: No Did pt. bring in CBG monitor from home?: No  How often do you need to have someone help you when you read instructions, pamphlets, or other written materials from your doctor or pharmacy?: 1 - Never  Interpreter Needed?: No  Information entered by :: Kandis Fantasia LPN   Activities of Daily Living    09/10/2022    9:03 PM  In your present state of health, do you have any difficulty performing the following activities:  Hearing? 0  Vision? 0  Difficulty concentrating or making decisions? 0  Walking or climbing stairs? 1  Dressing or bathing? 0  Doing errands, shopping? 0  Preparing Food and eating ? N  Using the Toilet? N  In the past six months, have you accidently leaked urine? N  Do you have problems with loss of bowel control? N  Managing your Medications? N  Managing your Finances? N  Housekeeping or managing your Housekeeping? N    Patient Care Team: Storm Frisk, MD as PCP - [facility] Tarry Kos, MD as Attending Physician (Orthopedic  Surgery)  Indicate any recent Medical Services you may have received from other than [facility] providers in the past year (date may be approximate).     Assessment:   This is a routine wellness examination for Sydney Harris.  Hearing/Vision screen Hearing Screening - Comments:: Hard of hearing; requesting a referral to audiology  Vision Screening - Comments:: Wears rx glasses - up to date with routine eye exams with [facility]    Dietary issues and exercise activities discussed:     Goals Addressed             This Visit's Progress    Increase physical activity        Depression Screen    09/10/2022    9:01 PM 08/13/2022    8:43 AM 02/12/2022    9:27 AM 02/12/2022    9:26 AM 06/07/2021   10:48 AM 07/30/2020   10:40 AM 03/28/2020   10:57 AM  PHQ 2/9 Scores  PHQ - 2 Score 2 4 4 4 1 2 2   PHQ- 9 Score 9 14 7 7  8 8     Fall Risk    09/10/2022    9:02 PM 08/13/2022    8:43 AM 07/03/2022    4:00 PM 11/05/2021    8:32 AM 10/02/2021   10:09 AM  Fall Risk   Falls in the past year? 0 0 0 0 0  Number falls in past yr: 0 0 0  0  Injury with Fall? 0 0 0  0  Risk for fall due to : Impaired mobility No Fall Risks No Fall Risks No Fall Risks No Fall Risks  Follow up Falls prevention discussed;Education provided;Falls evaluation completed   Falls evaluation completed Falls evaluation completed    MEDICARE RISK AT HOME: Medicare Risk at Home Any stairs in or around the home?: No If so, are there any without handrails?: No Home free of loose throw rugs in walkways, pet beds, electrical cords, etc?: Yes Adequate lighting in your home to reduce risk of falls?: Yes Life  alert?: No Use of a cane, walker or w/c?: Yes Grab bars in the bathroom?: Yes Shower chair or bench in shower?: No Elevated toilet seat or a handicapped toilet?: No  TIMED UP AND GO:  Was the test performed?  No     Cognitive Function:        09/10/2022    9:03 PM 06/07/2021   11:05 AM  6CIT Screen  What  Year? 0 points 0 points  What month? 0 points 0 points  What time? 0 points 0 points  Count back from 20 0 points 0 points  Months in reverse 2 points 0 points  Repeat phrase 0 points 0 points  Total Score 2 points 0 points    Immunizations Immunization History  Administered Date(s) Administered   Moderna Sars-Covid-2 Vaccination 06/04/2019, 07/02/2019    TDAP status: Due, Education has been provided regarding the importance of this vaccine. Advised may receive this vaccine at local pharmacy or Health Dept. Aware to provide a copy of the vaccination record if obtained from local pharmacy or Health Dept. Verbalized acceptance and understanding.  Flu Vaccine status: Declined, Education has been provided regarding the importance of this vaccine but patient still declined. Advised may receive this vaccine at local pharmacy or Health Dept. Aware to provide a copy of the vaccination record if obtained from local pharmacy or Health Dept. Verbalized acceptance and understanding.  Pneumococcal vaccine status: Up to date  Covid-19 vaccine status: Information provided on how to obtain vaccines.   Qualifies for Shingles Vaccine? Yes   Zostavax completed No   Shingrix Completed?: No.    Education has been provided regarding the importance of this vaccine. Patient has been advised to call insurance company to determine out of pocket expense if they have not yet received this vaccine. Advised may also receive vaccine at local pharmacy or Health Dept. Verbalized acceptance and understanding.  Screening Tests Health Maintenance  Topic Date Due   DTaP/Tdap/Td (1 - Tdap) Never done   COVID-19 Vaccine (3 - 2023-24 season) 09/20/2021   Diabetic kidney evaluation - Urine ACR  10/03/2022   HEMOGLOBIN A1C  01/02/2023   OPHTHALMOLOGY EXAM  01/23/2023   Diabetic kidney evaluation - eGFR measurement  07/03/2023   FOOT EXAM  07/03/2023   Medicare Annual Wellness (AWV)  09/10/2023   MAMMOGRAM  10/19/2023    Colonoscopy  04/17/2030   Hepatitis C Screening  Completed   HIV Screening  Completed   HPV VACCINES  Aged Out   INFLUENZA VACCINE  Discontinued   PAP SMEAR-Modifier  Discontinued   Zoster Vaccines- Shingrix  Discontinued    Health Maintenance  Health Maintenance Due  Topic Date Due   DTaP/Tdap/Td (1 - Tdap) Never done   COVID-19 Vaccine (3 - 2023-24 season) 09/20/2021   Diabetic kidney evaluation - Urine ACR  10/03/2022    Colorectal cancer screening: Type of screening: Colonoscopy. Completed 04/16/20. Repeat every 10 years  Mammogram status: Ordered today. Pt provided with contact info and advised to call to schedule appt.   Lung Cancer Screening: (Low Dose CT Chest recommended if Age 64-80 years, 20 pack-year currently smoking OR have quit w/in 15years.) does not qualify.   Lung Cancer Screening Referral: n/a  Additional Screening:  Hepatitis C Screening: does qualify; Completed 02/12/22  Vision Screening: Recommended annual ophthalmology exams for early detection of glaucoma and other disorders of the eye. Is the patient up to date with their annual eye exam?  Yes  Who is the provider or  what is the name of the office in which the patient attends annual eye exams? [facility]  If pt is not established with a provider, would they like to be referred to a provider to establish care? No .   Dental Screening: Recommended annual dental exams for proper oral hygiene  Diabetic Foot Exam: Diabetic Foot Exam: Completed 07/03/22  Community Resource Referral / Chronic Care Management: CRR required this visit?  No   CCM required this visit?  No     Plan:     I have personally reviewed and noted the following in the patient's chart:   Medical and social history Use of alcohol, tobacco or illicit drugs  Current medications and supplements including opioid prescriptions. Patient is currently taking opioid prescriptions. Information provided to patient regarding non-opioid  alternatives. Patient advised to discuss non-opioid treatment plan with their provider. Functional ability and status Nutritional status Physical activity Advanced directives List of other physicians Hospitalizations, surgeries, and ER visits in previous 12 months Vitals Screenings to include cognitive, depression, and falls Referrals and appointments  In addition, I have reviewed and discussed with patient certain preventive protocols, quality metrics, and best practice recommendations. A written personalized care plan for preventive services as well as general preventive health recommendations were provided to patient.     Kandis Fantasia [place], [place]   2/95/1884   After Visit Summary: (Mail) Due to this being a telephonic visit, the after visit summary with patients personalized plan was offered to patient via mail   Nurse Notes: Patient is concerned about referral to pain management and states that she didn't not receive all the parts to the cane that she received

## 2022-09-10 NOTE — Patient Instructions (Signed)
Sydney Harris , Thank you for taking time to come for your Medicare Wellness Visit. I appreciate your ongoing commitment to your health goals. Please review the following plan we discussed and let me know if I can assist you in the future.   Referrals/Orders/Follow-Ups/Clinician Recommendations: Aim for 30 minutes of exercise or brisk walking, 6-8 glasses of water, and 5 servings of fruits and vegetables each day.  This is a list of the screening recommended for you and due dates:  Health Maintenance  Topic Date Due   DTaP/Tdap/Td vaccine (1 - Tdap) Never done   COVID-19 Vaccine (3 - 2023-24 season) 09/20/2021   Yearly kidney health urinalysis for diabetes  10/03/2022   Hemoglobin A1C  01/02/2023   Eye exam for diabetics  01/23/2023   Yearly kidney function blood test for diabetes  07/03/2023   Complete foot exam   07/03/2023   Medicare Annual Wellness Visit  09/10/2023   Mammogram  10/19/2023   Colon Cancer Screening  04/17/2030   Hepatitis C Screening  Completed   HIV Screening  Completed   HPV Vaccine  Aged Out   Flu Shot  Discontinued   Pap Smear  Discontinued   Zoster (Shingles) Vaccine  Discontinued    Advanced directives: (ACP Link)Information on Advanced Care Planning can be found at [facility] Advance Health Care Directives Advance Health Care Directives ([email or web address]   Next Medicare Annual Wellness Visit scheduled for next year: Yes

## 2022-09-30 ENCOUNTER — Other Ambulatory Visit: Payer: Self-pay

## 2022-09-30 MED ORDER — VITAMIN D (ERGOCALCIFEROL) 1.25 MG (50000 UNIT) PO CAPS: 50000.0000 [IU] | ORAL_CAPSULE | ORAL | 4 refills | Status: AC

## 2022-09-30 MED ORDER — GNP TRUE METRIX AIR METER W/DEVICE KIT: PACK | 0 refills | Status: AC

## 2022-09-30 MED ORDER — LOSARTAN POTASSIUM-HCTZ 50-12.5 MG PO TABS: 1.0000 | ORAL_TABLET | Freq: Every day | ORAL | 2 refills | Status: DC

## 2022-09-30 MED ORDER — GNP TRUE METRIX GLUCOSE STRIPS VI STRP: ORAL_STRIP | 12 refills | Status: AC

## 2022-09-30 MED ORDER — TRUEPLUS LANCETS 33G MISC: 12 refills | Status: AC

## 2022-09-30 MED ORDER — BACLOFEN 10 MG PO TABS: 10.0000 mg | ORAL_TABLET | Freq: Three times a day (TID) | ORAL | 3 refills | Status: DC

## 2022-11-04 ENCOUNTER — Ambulatory Visit: Payer: Medicare (Managed Care) | Admitting: Critical Care Medicine

## 2022-11-04 ENCOUNTER — Other Ambulatory Visit: Payer: Self-pay | Admitting: Critical Care Medicine

## 2022-11-04 MED ORDER — GABAPENTIN 300 MG PO CAPS: 300.0000 mg | ORAL_CAPSULE | Freq: Three times a day (TID) | ORAL | 2 refills | Status: DC

## 2022-11-04 NOTE — Telephone Encounter (Signed)
Patient has requested that her medications be transferred to  [facility] [place], [place] - [address] Phone: [phone]  Fax: [phone]      metFORMIN (GLUCOPHAGE) 500 MG tablet  gabapentin (NEURONTIN) 300 MG capsule   *not on current list but patient is requesting refills oxyCODONE (OXY IR/ROXICODONE) 5 MG immediate release tablet  predniSONE (DELTASONE) 20 MG tablet

## 2022-11-04 NOTE — Telephone Encounter (Signed)
Requested Prescriptions  Pending Prescriptions Disp Refills   metFORMIN (GLUCOPHAGE) 500 MG tablet 90 tablet 3    Sig: Take 1 tablet (500 mg total) by mouth daily with breakfast.     Endocrinology:  Diabetes - Biguanides Failed - 11/04/2022 11:20 AM      Failed - B12 Level in normal range and within 720 days    No results found for: "VITAMINB12"       Failed - CBC within normal limits and completed in the last 12 months    WBC  Date Value Ref Range Status  10/02/2021 8.4 3.4 - 10.8 x10E3/uL Final  02/08/2016 5.3 4.0 - 10.5 K/uL Final   RBC  Date Value Ref Range Status  10/02/2021 4.68 3.77 - 5.28 x10E6/uL Final  02/08/2016 4.64 3.87 - 5.11 MIL/uL Final   Hemoglobin  Date Value Ref Range Status  10/02/2021 13.4 11.1 - 15.9 g/dL Final   Hematocrit  Date Value Ref Range Status  10/02/2021 40.5 34.0 - 46.6 % Final   MCHC  Date Value Ref Range Status  10/02/2021 33.1 31.5 - 35.7 g/dL Final  78/29/5621 30.8 30.0 - 36.0 g/dL Final   [facility]  Date Value Ref Range Status  10/02/2021 28.6 26.6 - 33.0 pg Final  02/08/2016 28.7 26.0 - 34.0 pg Final   MCV  Date Value Ref Range Status  10/02/2021 87 79 - 97 fL Final   No results found for: "PLTCOUNTKUC", "LABPLAT", "POCPLA" RDW  Date Value Ref Range Status  10/02/2021 12.3 11.7 - 15.4 % Final         Passed - Cr in normal range and within 360 days    Creatinine  Date Value Ref Range Status  03/28/2020 179.2 20.0 - 300.0 mg/dL Final   Creatinine, Ser  Date Value Ref Range Status  07/03/2022 0.84 0.57 - 1.00 mg/dL Final         Passed - HBA1C is between 0 and 7.9 and within 180 days    HbA1c, POC (prediabetic range)  Date Value Ref Range Status  03/28/2020 6.0 5.7 - 6.4 % Final   HbA1c, POC (controlled diabetic range)  Date Value Ref Range Status  02/12/2022 6.0 0.0 - 7.0 % Final   Hgb A1c MFr Bld  Date Value Ref Range Status  07/03/2022 6.3 (H) 4.8 - 5.6 % Final    Comment:             Prediabetes: 5.7 - 6.4           Diabetes: >6.4          Glycemic control for adults with diabetes: <7.0          Passed - eGFR in normal range and within 360 days    GFR calc Af Amer  Date Value Ref Range Status  12/21/2019 77 >59 mL/min/1.73 Final    Comment:    **In accordance with recommendations from the NKF-ASN Task force,**   [facility] is in the process of updating its eGFR calculation to the   2021 CKD-EPI creatinine equation that estimates kidney function   without a race variable.    GFR calc non Af Amer  Date Value Ref Range Status  12/21/2019 67 >59 mL/min/1.73 Final   eGFR  Date Value Ref Range Status  07/03/2022 80 >59 mL/min/1.73 Final         Passed - Valid encounter within last 6 months    Recent Outpatient Visits  2 months ago Chronic pain of left knee   [address] [facility] & [facility] Storm Frisk, MD   4 months ago Type 2 diabetes mellitus with hyperglycemia, without long-term current use of insulin [facility])   [place] [address] [facility] Storm Frisk, MD   8 months ago Type 2 diabetes mellitus with hyperglycemia, without long-term current use of insulin [facility])   [place] [facility] Storm Frisk, MD   1 year ago Type 2 diabetes mellitus with hyperglycemia, without long-term current use of insulin [facility])   [place] [facility] Storm Frisk, MD   1 year ago Type 2 diabetes mellitus with hyperglycemia, without long-term current use of insulin [facility])   [place] [facility] Storm Frisk, MD       Future Appointments             In 2 weeks Hoy Register, MD [facility]             gabapentin (NEURONTIN) 300 MG capsule 90 capsule 2    Sig: Take 1 capsule (300 mg total) by mouth 3 (three) times daily.     Neurology: Anticonvulsants - gabapentin Passed - 11/04/2022 11:20 AM      Passed - Cr in normal  range and within 360 days    Creatinine  Date Value Ref Range Status  03/28/2020 179.2 20.0 - 300.0 mg/dL Final   Creatinine, Ser  Date Value Ref Range Status  07/03/2022 0.84 0.57 - 1.00 mg/dL Final         Passed - Completed PHQ-2 or PHQ-9 in the last 360 days      Passed - Valid encounter within last 12 months    Recent Outpatient Visits           2 months ago Chronic pain of left knee   [facility] Storm Frisk, MD   4 months ago Type 2 diabetes mellitus with hyperglycemia, without long-term current use of insulin [facility])   [place] [facility] Storm Frisk, MD   8 months ago Type 2 diabetes mellitus with hyperglycemia, without long-term current use of insulin [facility])   [place] [facility] Storm Frisk, MD   1 year ago Type 2 diabetes mellitus with hyperglycemia, without long-term current use of insulin [facility])   [place] [facility] & [facility] Storm Frisk, MD   1 year ago Type 2 diabetes mellitus with hyperglycemia, without long-term current use of insulin [facility])    [facility] Storm Frisk, MD       Future Appointments             In 2 weeks Hoy Register, MD [place] [facility]             predniSONE (DELTASONE) 10 MG tablet 60 tablet 1    Sig: Take 4 daily for 2 days, 3 daily 2days 2 daily for 2 days then 1 daily and stay     Not Delegated - Endocrinology:  Oral Corticosteroids Failed - 11/04/2022 11:20 AM      Failed - This refill cannot be delegated      Failed - Manual Review: Eye exam for IOP if prolonged treatment      Failed - Bone  Mineral Density or Dexa Scan completed in the last 2 years      Passed - Glucose (serum) in normal range and within 180 days    Glucose  Date Value Ref Range Status  07/03/2022 96 70 - 99 mg/dL Final   Glucose, Bld  Date Value Ref Range  Status  02/08/2016 90 65 - 99 mg/dL Final   POC Glucose  Date Value Ref Range Status  02/12/2022 119 (A) 70 - 99 mg/dl Final   Glucose Fasting, POC  Date Value Ref Range Status  03/28/2020 110 (A) 70 - 99 mg/dL Final   Glucose-Capillary  Date Value Ref Range Status  06/12/2017 113 (H) 65 - 99 mg/dL Final         Passed - K in normal range and within 180 days    Potassium  Date Value Ref Range Status  07/03/2022 4.1 3.5 - 5.2 mmol/L Final         Passed - Na in normal range and within 180 days    Sodium  Date Value Ref Range Status  07/03/2022 144 134 - 144 mmol/L Final         Passed - Last BP in normal range    BP Readings from Last 1 Encounters:  09/08/22 129/85         Passed - Valid encounter within last 6 months    Recent Outpatient Visits           2 months ago Chronic pain of left knee   [facility] Storm [facility]isk, MD   4 months ago Type 2 diabetes mellitus with hyperglycemia, without long-term current use of insulin [facility])   [place] [facility] [place] & [facility] Storm Frisk, MD   8 months ago Type 2 diabetes mellitus with hyperglycemia, without long-term current use of insulin [facility])   [place] [facility] & [facility] Storm Frisk, MD   1 year ago Type 2 diabetes mellitus with hyperglycemia, without long-term current use of insulin [facility])   [place] [facility] Storm Frisk, MD   1 year ago Type 2 diabetes mellitus with hyperglycemia, without long-term current use of insulin [facility])   [place] [facility] Storm Frisk, MD       Future Appointments             In 2 weeks Hoy Register, MD  [facility]             oxyCODONE (OXY IR/ROXICODONE) 5 MG immediate release tablet 6 tablet 0    Sig: Take 1-2 tablets (5-10 mg total) by mouth daily as needed for up to 3 days for severe pain (pain  score 7-10).     Not Delegated - Analgesics:  Opioid Agonists Failed - 11/04/2022 11:20 AM      Failed - This refill cannot be delegated      Failed - Urine Drug Screen completed in last 360 days      Passed - Valid encounter within last 3 months    Recent Outpatient Visits           2 months ago Chronic pain of left knee   [facility]isk, MD   4 months ago Type 2 diabetes mellitus with hyperglycemia, without long-term current use of insulin (HCC)    [facility]  Storm Frisk, MD   8 months ago Type 2 diabetes mellitus with hyperglycemia, without long-term current use of insulin [facility])   [place] [facility] Storm Frisk, MD   1 year ago Type 2 diabetes mellitus with hyperglycemia, without long-term current use of insulin [facility])   [place] [facility] Storm Frisk, MD   1 year ago Type 2 diabetes mellitus with hyperglycemia, without long-term current use of insulin [facility])    [facility] [place] [facility] Storm Frisk, MD       Future Appointments             In 2 weeks Hoy Register, MD [facility]

## 2022-11-04 NOTE — Telephone Encounter (Signed)
Transfer medications to [facility].

## 2022-11-04 NOTE — Telephone Encounter (Signed)
Requested medication (s) are due for refill today: No  Requested medication (s) are on the active medication list: NO    Last refill: Prednisone 08/13/22  #60  1 refill    Oxy.  07/15/22  #6  0   Future visit scheduled Yes  11/19/22  Notes to clinic:Cannot refuse non-delegate meds.per protocol.  Requested Prescriptions  Pending Prescriptions Disp Refills   predniSONE (DELTASONE) 10 MG tablet 60 tablet 1    Sig: Take 4 daily for 2 days, 3 daily 2days 2 daily for 2 days then 1 daily and stay     Not Delegated - Endocrinology:  Oral Corticosteroids Failed - 11/04/2022 11:20 AM      Failed - This refill cannot be delegated      Failed - Manual Review: Eye exam for IOP if prolonged treatment      Failed - Bone Mineral Density or Dexa Scan completed in the last 2 years      Passed - Glucose (serum) in normal range and within 180 days    Glucose  Date Value Ref Range Status  07/03/2022 96 70 - 99 mg/dL Final   Glucose, Bld  Date Value Ref Range Status  02/08/2016 90 65 - 99 mg/dL Final   POC Glucose  Date Value Ref Range Status  02/12/2022 119 (A) 70 - 99 mg/dl Final   Glucose Fasting, POC  Date Value Ref Range Status  03/28/2020 110 (A) 70 - 99 mg/dL Final   Glucose-Capillary  Date Value Ref Range Status  06/12/2017 113 (H) 65 - 99 mg/dL Final         Passed - K in normal range and within 180 days    Potassium  Date Value Ref Range Status  07/03/2022 4.1 3.5 - 5.2 mmol/L Final         Passed - Na in normal range and within 180 days    Sodium  Date Value Ref Range Status  07/03/2022 144 134 - 144 mmol/L Final         Passed - Last BP in normal range    BP Readings from Last 1 Encounters:  09/08/22 129/85         Passed - Valid encounter within last 6 months    Recent Outpatient Visits           2 months ago Chronic pain of left knee   [facility] Storm Frisk, MD   4 months ago Type 2 diabetes mellitus with  hyperglycemia, without long-term current use of insulin [facility])   [place] [facility] & [facility] Storm Frisk, MD   8 months ago Type 2 diabetes mellitus with hyperglycemia, without long-term current use of insulin [facility])   [place] [facility] & [address] [facility] Storm Frisk, MD   1 year ago Type 2 diabetes mellitus with hyperglycemia, without long-term current use of insulin [facility])   [place] [facility] Storm Frisk, MD   1 year ago Type 2 diabetes mellitus with hyperglycemia, without long-term current use of insulin [facility])    [facility] & [facility] Storm Frisk, MD       Future Appointments             In 2 weeks Hoy Register, MD [facility]             oxyCODONE (OXY IR/ROXICODONE) 5 MG immediate release  tablet 6 tablet 0    Sig: Take 1-2 tablets (5-10 mg total) by mouth daily as needed for up to 3 days for severe pain (pain score 7-10).     Not Delegated - Analgesics:  Opioid Agonists Failed - 11/04/2022 11:20 AM      Failed - This refill cannot be delegated      Failed - Urine Drug Screen completed in last 360 days      Passed - Valid encounter within last 3 months    Recent Outpatient Visits           2 months ago Chronic pain of left knee   [facility] Storm Frisk, MD   4 months ago Type 2 diabetes mellitus with hyperglycemia, without long-term current use of insulin [facility])   [place] [facility] & [facility] Storm Frisk, MD   8 months ago Type 2 diabetes mellitus with hyperglycemia, without long-term current use of insulin [facility])   [place] [facility] Storm Frisk, MD   1 year ago Type 2 diabetes mellitus with hyperglycemia, without long-term current use of insulin [facility])   [place] [facility] Storm Frisk, MD   1 year ago  Type 2 diabetes mellitus with hyperglycemia, without long-term current use of insulin [facility])   [place] [facility] Storm Frisk, MD       Future Appointments             In 2 weeks Hoy Register, MD [facility]            Signed Prescriptions Disp Refills   gabapentin (NEURONTIN) 300 MG capsule 90 capsule 2    Sig: Take 1 capsule (300 mg total) by mouth 3 (three) times daily.     Neurology: Anticonvulsants - gabapentin Passed - 11/04/2022 11:20 AM      Passed - Cr in normal range and within 360 days    Creatinine  Date Value Ref Range Status  03/28/2020 179.2 20.0 - 300.0 mg/dL Final   Creatinine, Ser  Date Value Ref Range Status  07/03/2022 0.84 0.57 - 1.00 mg/dL Final         Passed - Completed PHQ-2 or PHQ-9 in the last 360 days      Passed - Valid encounter within last 12 months    Recent Outpatient Visits           2 months ago Chronic pain of left knee   [facility] Storm Frisk, MD   4 months ago Type 2 diabetes mellitus with hyperglycemia, without long-term current use of insulin [facility])   [place] [facility] Storm Frisk, MD   8 months ago Type 2 diabetes mellitus with hyperglycemia, without long-term current use of insulin [facility])   [place] [facility] Storm Frisk, MD   1 year ago Type 2 diabetes mellitus with hyperglycemia, without long-term current use of insulin [facility])   [place] [facility] Storm Frisk, MD   1 year ago Type 2 diabetes mellitus with hyperglycemia, without long-term current use of insulin [facility])   [place] [facility] & [facility] Storm Frisk, MD       Future Appointments             In  2 weeks Hoy Register, MD [facility]            Refused Prescriptions Disp Refills    metFORMIN (GLUCOPHAGE) 500 MG tablet 90 tablet 3    Sig: Take 1 tablet (500 mg total) by mouth daily with breakfast.     Endocrinology:  Diabetes - Biguanides Failed - 11/04/2022 11:20 AM      Failed - B12 Level in normal range and within 720 days    No results found for: "VITAMINB12"       Failed - CBC within normal limits and completed in the last 12 months    WBC  Date Value Ref Range Status  10/02/2021 8.4 3.4 - 10.8 x10E3/uL Final  02/08/2016 5.3 4.0 - 10.5 K/uL Final   RBC  Date Value Ref Range Status  10/02/2021 4.68 3.77 - 5.28 x10E6/uL Final  02/08/2016 4.64 3.87 - 5.11 MIL/uL Final   Hemoglobin  Date Value Ref Range Status  10/02/2021 13.4 11.1 - 15.9 g/dL Final   Hematocrit  Date Value Ref Range Status  10/02/2021 40.5 34.0 - 46.6 % Final   MCHC  Date Value Ref Range Status  10/02/2021 33.1 31.5 - 35.7 g/dL Final  40/98/1191 47.8 30.0 - 36.0 g/dL Final   [facility]  Date Value Ref Range Status  10/02/2021 28.6 26.6 - 33.0 pg Final  02/08/2016 28.7 26.0 - 34.0 pg Final   MCV  Date Value Ref Range Status  10/02/2021 87 79 - 97 fL Final   No results found for: "PLTCOUNTKUC", "LABPLAT", "POCPLA" RDW  Date Value Ref Range Status  10/02/2021 12.3 11.7 - 15.4 % Final         Passed - Cr in normal range and within 360 days    Creatinine  Date Value Ref Range Status  03/28/2020 179.2 20.0 - 300.0 mg/dL Final   Creatinine, Ser  Date Value Ref Range Status  07/03/2022 0.84 0.57 - 1.00 mg/dL Final         Passed - HBA1C is between 0 and 7.9 and within 180 days    HbA1c, POC (prediabetic range)  Date Value Ref Range Status  03/28/2020 6.0 5.7 - 6.4 % Final   HbA1c, POC (controlled diabetic range)  Date Value Ref Range Status  02/12/2022 6.0 0.0 - 7.0 % Final   Hgb A1c MFr Bld  Date Value Ref Range Status  07/03/2022 6.3 (H) 4.8 - 5.6 % Final    Comment:             Prediabetes: 5.7 - 6.4          Diabetes: >6.4          Glycemic control for adults with  diabetes: <7.0          Passed - eGFR in normal range and within 360 days    GFR calc Af Amer  Date Value Ref Range Status  12/21/2019 77 >59 mL/min/1.73 Final    Comment:    **In accordance with recommendations from the NKF-ASN Task force,**   [facility] is in the process of updating its eGFR calculation to the   2021 CKD-EPI creatinine equation that estimates kidney function   without a race variable.    GFR calc non Af Amer  Date Value Ref Range Status  12/21/2019 67 >59 mL/min/1.73 Final   eGFR  Date Value Ref Range Status  07/03/2022 80 >59 mL/min/1.73 Final         Passed -  Valid encounter within last 6 months    Recent Outpatient Visits           2 months ago Chronic pain of left knee   [facility] Storm Frisk, MD   4 months ago Type 2 diabetes mellitus with hyperglycemia, without long-term current use of insulin [facility])   [place] [facility] Storm Frisk, MD   8 months ago Type 2 diabetes mellitus with hyperglycemia, without long-term current use of insulin [facility])   [place] [facility] Storm Frisk, MD   1 year ago Type 2 diabetes mellitus with hyperglycemia, without long-term current use of insulin [facility])   [place] [facility] Storm Frisk, MD   1 year ago Type 2 diabetes mellitus with hyperglycemia, without long-term current use of insulin [facility])   [place] [facility] Storm Frisk, MD       Future Appointments             In 2 weeks Hoy Register, MD [facility]

## 2022-11-19 ENCOUNTER — Ambulatory Visit: Payer: Medicare HMO | Attending: Family Medicine | Admitting: Family Medicine

## 2022-11-19 ENCOUNTER — Encounter: Payer: Self-pay | Admitting: Family Medicine

## 2022-11-19 VITALS — BP 132/89 | HR 61 | Wt 199.4 lb

## 2022-11-19 DIAGNOSIS — G8929 Other chronic pain: Secondary | ICD-10-CM | POA: Diagnosis not present

## 2022-11-19 DIAGNOSIS — E1149 Type 2 diabetes mellitus with other diabetic neurological complication: Secondary | ICD-10-CM

## 2022-11-19 DIAGNOSIS — F319 Bipolar disorder, unspecified: Secondary | ICD-10-CM

## 2022-11-19 DIAGNOSIS — E1140 Type 2 diabetes mellitus with diabetic neuropathy, unspecified: Secondary | ICD-10-CM | POA: Insufficient documentation

## 2022-11-19 DIAGNOSIS — Z7984 Long term (current) use of oral hypoglycemic drugs: Secondary | ICD-10-CM

## 2022-11-19 DIAGNOSIS — E1165 Type 2 diabetes mellitus with hyperglycemia: Secondary | ICD-10-CM | POA: Diagnosis not present

## 2022-11-19 DIAGNOSIS — M25562 Pain in left knee: Secondary | ICD-10-CM

## 2022-11-19 LAB — POCT GLYCOSYLATED HEMOGLOBIN (HGB A1C): HbA1c, POC (controlled diabetic range): 6.3 % (ref 0.0–7.0)

## 2022-11-19 LAB — GLUCOSE, POCT (MANUAL RESULT ENTRY): POC Glucose: 119 mg/dL — AB (ref 70–99)

## 2022-11-19 MED ORDER — MELOXICAM 7.5 MG PO TABS: 7.5000 mg | ORAL_TABLET | Freq: Every day | ORAL | 1 refills | Status: DC

## 2022-11-19 MED ORDER — PREDNISONE 20 MG PO TABS: 20.0000 mg | ORAL_TABLET | Freq: Every day | ORAL | 0 refills | Status: DC

## 2022-11-19 MED ORDER — MISC. DEVICES MISC: 0 refills | Status: AC

## 2022-11-19 NOTE — Patient Instructions (Signed)
VISIT SUMMARY:  During your visit today, we discussed your ongoing mental health concerns, chronic knee pain, and other health conditions. We have made several plans to help manage your symptoms and improve your overall well-being.  YOUR PLAN:  -BIPOLAR DISORDER AND DEPRESSION: Bipolar disorder and depression are mental health conditions that affect your mood and energy levels. You are currently taking Abilify and Lexapro. We will place an urgent referral to behavioral health for medication management and ongoing care.  -CHRONIC KNEE PAIN: Chronic knee pain is long-lasting pain in your knee that can affect your daily activities. We will refer you to a different pain clinic for evaluation and management. In the meantime, we will prescribe a course of steroids and meloxicam for immediate pain relief. Please start meloxicam after finishing the course of steroids.  -TYPE 2 DIABETES MELLITUS: Type 2 diabetes is a condition where your body does not use insulin properly, leading to high blood sugar levels. Your diabetes is well-controlled with a recent A1C of 6.3. Continue your current regimen of Metformin.  -HYPERLIPIDEMIA: Hyperlipidemia is a condition with high levels of fats (lipids) in your blood. Your condition is well-controlled with Atorvastatin. Continue your current regimen of Atorvastatin.  -HYPERTENSION: Hypertension is high blood pressure. Your condition is well-controlled with Losartan and Hydrochlorothiazide. Continue your current regimen of Losartan and Hydrochlorothiazide.  -PERIPHERAL NEUROPATHY: Peripheral neuropathy is a condition that causes numbness and tingling in your hands and feet. You are currently taking Gabapentin. Continue your current regimen of Gabapentin.  -GENERAL HEALTH MAINTENANCE: Consider using a knee brace for additional support for your left knee. We will write a prescription for a cane and send it to a medical equipment store. Schedule a follow-up visit in six  months.  INSTRUCTIONS:  We will place an urgent referral to behavioral health for your mental health management. Additionally, we will refer you to a different pain clinic for your chronic knee pain. Please start meloxicam after finishing the course of steroids. Continue your current medications for diabetes, cholesterol, blood pressure, and peripheral neuropathy. Consider using a knee brace and we will provide a prescription for a cane. Schedule a follow-up visit in six months.

## 2022-11-19 NOTE — Progress Notes (Signed)
Subjective:  Patient ID: Dominica Severin, female    DOB: 14-Dec-1965  Age: 57 y.o. MRN: [id number]  CC: Pain (In leg ), Medication Refill, and Medical Management of Chronic Issues (Patient has cut on fingers and is feeling numbeness in other hand )   HPI Khristina Amanpreet Bonura is a 57 y.o. year old female patient of Dr. Delford Field with a history of bipolar disorder, type 2 diabetes mellitus, HTN  Interval History: Discussed the use of AI scribe software for clinical note transcription with the patient, who gave verbal consent to proceed.  She presents for a routine visit. She is currently taking Abilify and Lexapro. She is seeking to reestablish care with a mental health provider after her previous provider, Dr. Delford Field, retired. She has not yet received a referral to return to mental health services.  The patient's primary concern is chronic pain, particularly in her left leg. She reports that she has not been receiving her usual pain medication for several months and has been relying on steroids, which she finds helpful, to manage her pain. She has previously been seen at [facility] pain clinic and by a knee specialist, but has not received ongoing pain management as [facility] clinic discontinued her pain management with no reason per patient. The patient reports that the pain is causing significant distress and contributing to her depression.  The patient also reports numbness in her hands, for which she is taking gabapentin. She is also taking losartan and hydrochlorothiazide for blood pressure management, atorvastatin for cholesterol management, and metformin for diabetes management. She reports good adherence to her medication regimen.  A1c is 6.3 today.       Past Medical History:  Diagnosis Date   Anxiety    Arthritis of left knee    Bipolar disorder (HCC)    Blood transfusion 1981   "related to my daughter being born"   Depression    Diabetes mellitus without complication (HCC)     Fibroid tumor    Gout    Hypertension    Plantar fasciitis    Substance abuse (HCC)    crack cocaine  14 yrs sober.    Past Surgical History:  Procedure Laterality Date   ABDOMINAL HYSTERECTOMY     "for fibroid tumors"    Family History  Problem Relation Age of Onset   Diabetes Father    Asthma Daughter    Heart failure Neg Hx    Cancer Neg Hx    Colon polyps Neg Hx    Colon cancer Neg Hx    Esophageal cancer Neg Hx    Rectal cancer Neg Hx    Stomach cancer Neg Hx     Social History   Socioeconomic History   Marital status: Single    Spouse name: Not on file   Number of children: Not on file   Years of education: Not on file   Highest education level: 9th grade  Occupational History   Not on file  Tobacco Use   Smoking status: Former    Current packs/day: 0.00    Average packs/day: 0.5 packs/day for 33.0 years (16.5 ttl pk-yrs)    Types: Cigarettes    Start date: 5    Quit date: 2018    Years since quitting: 6.8   Smokeless tobacco: Never  Vaping Use   Vaping status: Never Used  Substance and Sexual Activity   Alcohol use: No   Drug use: Not Currently    Types: "Crack" cocaine, Marijuana  Comment: clean since 2015; currently chews CBD gummies   Sexual activity: Yes    Partners: Male  Other Topics Concern   Not on file  Social History Narrative   Not on file   Social Determinants of Health   Financial Resource Strain: Low Risk  (09/10/2022)   Overall Financial Resource Strain (CARDIA)    Difficulty of Paying Living Expenses: Not hard at all  Food Insecurity: No Food Insecurity (09/10/2022)   Hunger Vital Sign    Worried About Running Out of Food in the Last Year: Never true    Ran Out of Food in the Last Year: Never true  Transportation Needs: No Transportation Needs (09/10/2022)   PRAPARE - Administrator, Civil Service (Medical): No    Lack of Transportation (Non-Medical): No  Physical Activity: Inactive (09/10/2022)   Exercise  Vital Sign    Days of Exercise per Week: 0 days    Minutes of Exercise per Session: 0 min  Stress: No Stress Concern Present (09/10/2022)   [employer] of Occupational Health - Occupational Stress Questionnaire    Feeling of Stress : Not at all  Social Connections: Moderately Isolated (09/10/2022)   Social Connection and Isolation Panel [NHANES]    Frequency of Communication with Friends and Family: Twice a week    Frequency of Social Gatherings with Friends and Family: Three times a week    Attends Religious Services: More than 4 times per year    Active Member of Clubs or Organizations: No    Attends Banker Meetings: Never    Marital Status: Never married    Allergies  Allergen Reactions   Penicillins Hives    Has patient had a PCN reaction causing immediate rash, facial/tongue/throat swelling, SOB or lightheadedness with hypotension: Yes Has patient had a PCN reaction causing severe rash involving mucus membranes or skin necrosis: Yes Has patient had a PCN reaction that required hospitalization No Has patient had a PCN reaction occurring within the last 10 years: Yes If all of the above answers are "NO", then may proceed with Cephalosporin use.    Tomato     hives    Outpatient Medications Prior to Visit  Medication Sig Dispense Refill   ARIPiprazole (ABILIFY) 15 MG tablet Take 1 tablet (15 mg total) by mouth at bedtime. 30 tablet 4   atorvastatin (LIPITOR) 10 MG tablet Take 1 tablet by mouth once daily 90 tablet 1   baclofen (LIORESAL) 10 MG tablet Take 1 tablet (10 mg total) by mouth 3 (three) times daily. 90 each 3   benztropine (COGENTIN) 0.5 MG tablet Take 1 tablet (0.5 mg total) by mouth at bedtime. 30 tablet 4   Blood Glucose Monitoring Suppl (GNP TRUE METRIX AIR METER) w/Device KIT Use to test blood sugar 3 times a day 1 kit 0   escitalopram (LEXAPRO) 5 MG tablet Take 1 tablet (5 mg total) by mouth daily. 30 tablet 4   gabapentin (NEURONTIN) 300 MG  capsule Take 1 capsule (300 mg total) by mouth 3 (three) times daily. 90 capsule 2   glucose blood (GNP TRUE METRIX GLUCOSE STRIPS) test strip Use as instructed 100 each 12   latanoprost (XALATAN) 0.005 % ophthalmic solution Place 1 drop into both eyes at bedtime.     losartan-hydrochlorothiazide (HYZAAR) 50-12.5 MG tablet Take 1 tablet by mouth daily. 90 tablet 2   metFORMIN (GLUCOPHAGE) 500 MG tablet Take 1 tablet (500 mg total) by mouth daily with breakfast. 90 tablet  3   Multiple Vitamin (MULTIVITAMIN ADULT PO) Take by mouth daily.     Multiple Vitamins-Minerals (HAIR SKIN AND NAILS FORMULA PO) Take by mouth daily.     naloxone (NARCAN) nasal spray 4 mg/0.1 mL SMARTSIG:Both Nares     omeprazole (PRILOSEC) 40 MG capsule Take 1 capsule (40 mg total) by mouth daily. 60 capsule 3   traZODone (DESYREL) 100 MG tablet TAKE 1 & 1/2 (ONE & ONE-HALF) TABLETS BY MOUTH AT BEDTIME AS NEEDED 45 tablet 1   TRUEplus Lancets 33G MISC Use to test blood sugar 3 times a day 100 each 12   Vitamin D, Ergocalciferol, (DRISDOL) 1.25 MG (50000 UNIT) CAPS capsule Take 1 capsule (50,000 Units total) by mouth once a week. 12 capsule 4   No facility-administered medications prior to visit.     ROS Review of Systems  Constitutional:  Negative for activity change and appetite change.  HENT:  Negative for sinus pressure and sore throat.   Respiratory:  Negative for chest tightness, shortness of breath and wheezing.   Cardiovascular:  Negative for chest pain and palpitations.  Gastrointestinal:  Negative for abdominal distention, abdominal pain and constipation.  Genitourinary: Negative.   Musculoskeletal:        See HPI  Psychiatric/Behavioral:  Negative for behavioral problems and dysphoric mood.     Objective:  BP 132/89 (BP Location: Right Arm, Patient Position: Sitting, Cuff Size: Normal)   Pulse 61   Wt 199 lb 6.4 oz (90.4 kg)   LMP 12/28/2010   SpO2 100%   BMI 35.32 kg/m      11/19/2022    8:50 AM  09/10/2022   12:11 PM 09/08/2022    8:16 AM  BP/Weight  Systolic BP 132 -- 129  Diastolic BP 89 -- 85  Wt. (Lbs) 199.4 199   BMI 35.32 kg/m2 35.25 kg/m2       Physical Exam Constitutional:      Appearance: She is well-developed.  Cardiovascular:     Rate and Rhythm: Normal rate.     Heart sounds: Normal heart sounds. No murmur heard. Pulmonary:     Effort: Pulmonary effort is normal.     Breath sounds: Normal breath sounds. No wheezing or rales.  Chest:     Chest wall: No tenderness.  Abdominal:     General: Bowel sounds are normal. There is no distension.     Palpations: Abdomen is soft. There is no mass.     Tenderness: There is no abdominal tenderness.  Musculoskeletal:     Right knee: No swelling. Normal range of motion. No tenderness.     Left knee: No swelling. Decreased range of motion. Tenderness present.     Right lower leg: No edema.     Left lower leg: No edema.  Neurological:     Mental Status: She is alert and oriented to person, place, and time.  Psychiatric:        Mood and Affect: Mood normal.        Latest Ref Rng & Units 07/03/2022    5:07 PM 10/02/2021   10:42 AM 12/21/2019   11:28 AM  CMP  Glucose 70 - 99 mg/dL 96  85  97   BUN 6 - 24 mg/dL 20  19  21    Creatinine 0.57 - 1.00 mg/dL 2.20  2.54  2.70   Sodium 134 - 144 mmol/L 144  145  140   Potassium 3.5 - 5.2 mmol/L 4.1  4.3  4.2  Chloride 96 - 106 mmol/L 105  103  102   CO2 20 - 29 mmol/L 27  27  24    Calcium 8.7 - 10.2 mg/dL 47.8  29.5  62.1   Total Protein 6.0 - 8.5 g/dL 7.3  7.5  7.6   Total Bilirubin 0.0 - 1.2 mg/dL 0.3  0.2  0.3   Alkaline Phos 44 - 121 IU/L 63  64  64   AST 0 - 40 IU/L 19  14  16    ALT 0 - 32 IU/L 17  15  19      Lipid Panel     Component Value Date/Time   CHOL 189 07/03/2022 1707   TRIG 86 07/03/2022 1707   HDL 74 07/03/2022 1707   CHOLHDL 2.6 07/03/2022 1707   LDLCALC 100 (H) 07/03/2022 1707    CBC    Component Value Date/Time   WBC 8.4 10/02/2021 1042    WBC 5.3 02/08/2016 1459   RBC 4.68 10/02/2021 1042   RBC 4.64 02/08/2016 1459   HGB 13.4 10/02/2021 1042   HCT 40.5 10/02/2021 1042   PLT 426 10/02/2021 1042   MCV 87 10/02/2021 1042   MCH 28.6 10/02/2021 1042   MCH 28.7 02/08/2016 1459   MCHC 33.1 10/02/2021 1042   MCHC 32.7 02/08/2016 1459   RDW 12.3 10/02/2021 1042   LYMPHSABS 3.7 (H) 10/02/2021 1042   MONOABS 1.4 (H) 07/02/2013 0350   EOSABS 0.2 10/02/2021 1042   BASOSABS 0.1 10/02/2021 1042    Lab Results  Component Value Date   HGBA1C 6.3 11/19/2022    Assessment & Plan:      Bipolar Disorder and Depression Patient is currently on Abilify and Lexapro. Patient is not currently seeing a mental health provider but has expressed interest in doing so. -Place urgent referral to behavioral health for medication management and ongoing care.  Chronic Knee Pain Patient reports significant pain in the left knee. Previous use of meloxicam was not effective. Patient has previously seen by Ortho -Dr. Roda Shutters And was on pain medication, but this has been discontinued. -Place referral to a different pain clinic for evaluation and management of chronic knee pain. -Prescribe a course of steroids and meloxicam for immediate pain relief. Instruct patient to start meloxicam after finishing the course of steroids.  Type 2 Diabetes Mellitus Well-controlled with a recent A1C of 6.3. -Continue current regimen of Metformin. -Counseled on Diabetic diet, my plate method, 308 minutes of moderate intensity exercise/week Blood sugar logs with fasting goals of 80-120 mg/dl, random of less than 657 and in the event of sugars less than 60 mg/dl or greater than 846 mg/dl encouraged to notify the clinic. Advised on the need for annual eye exams, annual foot exams, Pneumonia vaccine.   Hyperlipidemia Well-controlled on Atorvastatin. -Continue current regimen of Atorvastatin.  Hypertension Well-controlled on Losartan and Hydrochlorothiazide. -Continue  current regimen of Losartan and Hydrochlorothiazide.  Peripheral Neuropathy Patient reports numbness in hands. Currently on Gabapentin. -Continue current regimen of Gabapentin.  General Health Maintenance / Followup Plans -Consider a knee brace for additional support for the left knee. -Write a prescription for a cane and send to a medical equipment store. -Schedule follow-up visit in six months.          Meds ordered this encounter  Medications   Misc. Devices MISC    Sig: Cane. Dagnosis- left knee pain    Dispense:  1 each    Refill:  0   predniSONE (DELTASONE) 20 MG tablet  Sig: Take 1 tablet (20 mg total) by mouth daily with breakfast.    Dispense:  5 tablet    Refill:  0   meloxicam (MOBIC) 7.5 MG tablet    Sig: Take 1 tablet (7.5 mg total) by mouth daily.    Dispense:  30 tablet    Refill:  1    Follow-up: Return in about 6 months (around 05/20/2023) for Chronic medical conditions.       Hoy Register, MD, FAAFP. [facility] [place], [place] [phone]   11/19/2022, 9:04 AM

## 2022-11-21 LAB — BASIC METABOLIC PANEL
BUN/Creatinine Ratio: 19 (ref 9–23)
BUN: 16 mg/dL (ref 6–24)
CO2: 24 mmol/L (ref 20–29)
Calcium: 10.4 mg/dL — ABNORMAL HIGH (ref 8.7–10.2)
Chloride: 104 mmol/L (ref 96–106)
Creatinine, Ser: 0.84 mg/dL (ref 0.57–1.00)
Glucose: 102 mg/dL — ABNORMAL HIGH (ref 70–99)
Potassium: 4.6 mmol/L (ref 3.5–5.2)
Sodium: 144 mmol/L (ref 134–144)
eGFR: 80 mL/min/{1.73_m2} (ref >59)

## 2022-11-21 LAB — MICROALBUMIN / CREATININE URINE RATIO
Creatinine, Urine: 186.7 mg/dL
Microalb/Creat Ratio: 6 mg/g{creat} (ref 0–29)
Microalbumin, Urine: 11.4 ug/mL

## 2022-11-23 ENCOUNTER — Other Ambulatory Visit: Payer: Self-pay | Admitting: Critical Care Medicine

## 2022-11-24 NOTE — Telephone Encounter (Signed)
  Change of pharmacy  Requested Prescriptions  Pending Prescriptions Disp Refills   omeprazole (PRILOSEC) 40 MG capsule [Pharmacy Med Name: OMEPRAZOLE DR 40MG   CAP] 60 capsule 0    Sig: Take 1 capsule by mouth once daily     Gastroenterology: Proton Pump Inhibitors Passed - 11/23/2022  7:32 AM      Passed - Valid encounter within last 12 months    Recent Outpatient Visits           5 days ago Type 2 diabetes mellitus with hyperglycemia, without long-term current use of insulin (HCC)   [facility] [place]. [facility] Hoy Register, MD   3 months ago Chronic pain of left knee   [facility] Of [place]. [facility] Storm Frisk, MD   4 months ago Type 2 diabetes mellitus with hyperglycemia, without long-term current use of insulin [facility])   [facility] [place]. [facility] Storm Frisk, MD   9 months ago Type 2 diabetes mellitus with hyperglycemia, without long-term current use of insulin [facility])   [place] [facility] - A Dept Of [place]. [facility] Storm Frisk, MD   1 year ago Type 2 diabetes mellitus with hyperglycemia, without long-term current use of insulin [facility])    [facility] - [facility] Dept Of [place]. [facility] Storm Frisk, MD       Future Appointments             In 5 months Hoy Register, MD [facility] - A Dept Of [place]. [facility]

## 2022-12-06 LAB — AMB RESULTS CONSOLE CBG: Glucose: 77

## 2022-12-06 NOTE — Progress Notes (Signed)
Declined SDOH.

## 2022-12-10 ENCOUNTER — Other Ambulatory Visit: Payer: Self-pay | Admitting: Family Medicine

## 2022-12-10 ENCOUNTER — Other Ambulatory Visit: Payer: Self-pay | Admitting: Critical Care Medicine

## 2022-12-10 DIAGNOSIS — G8929 Other chronic pain: Secondary | ICD-10-CM

## 2022-12-13 DIAGNOSIS — M25562 Pain in left knee: Secondary | ICD-10-CM | POA: Diagnosis not present

## 2022-12-13 DIAGNOSIS — E109 Type 1 diabetes mellitus without complications: Secondary | ICD-10-CM | POA: Diagnosis not present

## 2022-12-13 DIAGNOSIS — R030 Elevated blood-pressure reading, without diagnosis of hypertension: Secondary | ICD-10-CM | POA: Diagnosis not present

## 2022-12-13 DIAGNOSIS — Z79899 Other long term (current) drug therapy: Secondary | ICD-10-CM | POA: Diagnosis not present

## 2022-12-13 DIAGNOSIS — M545 Low back pain, unspecified: Secondary | ICD-10-CM | POA: Diagnosis not present

## 2022-12-13 DIAGNOSIS — Z0000 Encounter for general adult medical examination without abnormal findings: Secondary | ICD-10-CM | POA: Diagnosis not present

## 2022-12-13 DIAGNOSIS — M546 Pain in thoracic spine: Secondary | ICD-10-CM | POA: Diagnosis not present

## 2022-12-13 DIAGNOSIS — E1169 Type 2 diabetes mellitus with other specified complication: Secondary | ICD-10-CM | POA: Diagnosis not present

## 2022-12-13 DIAGNOSIS — E6609 Other obesity due to excess calories: Secondary | ICD-10-CM | POA: Diagnosis not present

## 2022-12-17 DIAGNOSIS — Z79899 Other long term (current) drug therapy: Secondary | ICD-10-CM | POA: Diagnosis not present

## 2022-12-21 ENCOUNTER — Encounter: Payer: Self-pay | Admitting: Nurse Practitioner

## 2022-12-21 MED ORDER — GABAPENTIN 300 MG PO CAPS: 300.0000 mg | ORAL_CAPSULE | Freq: Three times a day (TID) | ORAL | 2 refills | Status: DC

## 2022-12-21 MED ORDER — METFORMIN HCL 500 MG PO TABS: 500.0000 mg | ORAL_TABLET | Freq: Every day | ORAL | 0 refills | Status: DC

## 2022-12-21 MED ORDER — MELOXICAM 7.5 MG PO TABS: 7.5000 mg | ORAL_TABLET | Freq: Every day | ORAL | 1 refills | Status: DC

## 2023-01-09 DIAGNOSIS — Z79899 Other long term (current) drug therapy: Secondary | ICD-10-CM | POA: Diagnosis not present

## 2023-01-09 DIAGNOSIS — M549 Dorsalgia, unspecified: Secondary | ICD-10-CM | POA: Diagnosis not present

## 2023-01-09 DIAGNOSIS — M25562 Pain in left knee: Secondary | ICD-10-CM | POA: Diagnosis not present

## 2023-01-12 DIAGNOSIS — Z79899 Other long term (current) drug therapy: Secondary | ICD-10-CM | POA: Diagnosis not present

## 2023-01-16 DIAGNOSIS — E7800 Pure hypercholesterolemia, unspecified: Secondary | ICD-10-CM | POA: Diagnosis not present

## 2023-01-16 DIAGNOSIS — Z6835 Body mass index (BMI) 35.0-35.9, adult: Secondary | ICD-10-CM | POA: Diagnosis not present

## 2023-01-16 DIAGNOSIS — E559 Vitamin D deficiency, unspecified: Secondary | ICD-10-CM | POA: Diagnosis not present

## 2023-01-16 DIAGNOSIS — E109 Type 1 diabetes mellitus without complications: Secondary | ICD-10-CM | POA: Diagnosis not present

## 2023-01-16 DIAGNOSIS — M109 Gout, unspecified: Secondary | ICD-10-CM | POA: Diagnosis not present

## 2023-01-16 DIAGNOSIS — R030 Elevated blood-pressure reading, without diagnosis of hypertension: Secondary | ICD-10-CM | POA: Diagnosis not present

## 2023-01-16 DIAGNOSIS — E6609 Other obesity due to excess calories: Secondary | ICD-10-CM | POA: Diagnosis not present

## 2023-01-16 DIAGNOSIS — R5383 Other fatigue: Secondary | ICD-10-CM | POA: Diagnosis not present

## 2023-01-20 ENCOUNTER — Other Ambulatory Visit: Payer: Self-pay | Admitting: Family Medicine

## 2023-01-21 ENCOUNTER — Other Ambulatory Visit: Payer: Self-pay | Admitting: Family Medicine

## 2023-02-03 DIAGNOSIS — I10 Essential (primary) hypertension: Secondary | ICD-10-CM | POA: Diagnosis not present

## 2023-02-03 DIAGNOSIS — E1169 Type 2 diabetes mellitus with other specified complication: Secondary | ICD-10-CM | POA: Diagnosis not present

## 2023-02-03 DIAGNOSIS — Z0000 Encounter for general adult medical examination without abnormal findings: Secondary | ICD-10-CM | POA: Diagnosis not present

## 2023-02-03 DIAGNOSIS — Z6834 Body mass index (BMI) 34.0-34.9, adult: Secondary | ICD-10-CM | POA: Diagnosis not present

## 2023-02-03 DIAGNOSIS — M25562 Pain in left knee: Secondary | ICD-10-CM | POA: Diagnosis not present

## 2023-02-03 DIAGNOSIS — Z79899 Other long term (current) drug therapy: Secondary | ICD-10-CM | POA: Diagnosis not present

## 2023-02-03 DIAGNOSIS — M549 Dorsalgia, unspecified: Secondary | ICD-10-CM | POA: Diagnosis not present

## 2023-02-06 DIAGNOSIS — Z79899 Other long term (current) drug therapy: Secondary | ICD-10-CM | POA: Diagnosis not present

## 2023-02-20 NOTE — Progress Notes (Signed)
The patient attended 12/06/22 screening event where her BP screening results was 149/90 and her blood glucose was 77mg /dl. At the event the patient noted she has a pcp but no insurance. Patient declined the SDOH questionnaire.  Per chart review the pt does have a pcp and the last ov with pcp was 11/19/22. The pt has active Medicaid as of 02/27/23. Hypertension and type 2 diabetes are listed in the patient's problem list. Losartan is also listed on the pts current medications list as well as blood glucose monitoring supplies. Chart review also indicates a future appt on 05/20/23. CHW sent an inbasket message of these results to the patient's pcp(Newlin, Enobong, MD ) just to make sure she Is aware.  No additional Health equity team support indicated at this time.

## 2023-02-23 ENCOUNTER — Other Ambulatory Visit: Payer: Self-pay | Admitting: Family Medicine

## 2023-03-06 DIAGNOSIS — E109 Type 1 diabetes mellitus without complications: Secondary | ICD-10-CM | POA: Diagnosis not present

## 2023-03-06 DIAGNOSIS — Z6834 Body mass index (BMI) 34.0-34.9, adult: Secondary | ICD-10-CM | POA: Diagnosis not present

## 2023-03-06 DIAGNOSIS — I10 Essential (primary) hypertension: Secondary | ICD-10-CM | POA: Diagnosis not present

## 2023-03-06 DIAGNOSIS — Z79899 Other long term (current) drug therapy: Secondary | ICD-10-CM | POA: Diagnosis not present

## 2023-03-06 DIAGNOSIS — E6609 Other obesity due to excess calories: Secondary | ICD-10-CM | POA: Diagnosis not present

## 2023-03-06 DIAGNOSIS — E1169 Type 2 diabetes mellitus with other specified complication: Secondary | ICD-10-CM | POA: Diagnosis not present

## 2023-03-06 DIAGNOSIS — M549 Dorsalgia, unspecified: Secondary | ICD-10-CM | POA: Diagnosis not present

## 2023-03-06 DIAGNOSIS — M25562 Pain in left knee: Secondary | ICD-10-CM | POA: Diagnosis not present

## 2023-03-10 DIAGNOSIS — Z79899 Other long term (current) drug therapy: Secondary | ICD-10-CM | POA: Diagnosis not present

## 2023-03-17 DIAGNOSIS — H40053 Ocular hypertension, bilateral: Secondary | ICD-10-CM | POA: Diagnosis not present

## 2023-03-17 DIAGNOSIS — H2513 Age-related nuclear cataract, bilateral: Secondary | ICD-10-CM | POA: Diagnosis not present

## 2023-03-25 ENCOUNTER — Telehealth: Payer: Self-pay

## 2023-03-25 NOTE — Telephone Encounter (Signed)
 Called placed to [id number] - [place], [place] - [address] per patient request [facility] all [facility]'s medication to them. Contact information for  [facility] - [place], [place] - [address] [phone] to [place] at [place].

## 2023-03-25 NOTE — Telephone Encounter (Signed)
 Copied from CRM [id number]. Topic: Clinical - Prescription Issue >> Mar 25, 2023  8:50 AM Sydney Harris wrote: Reason for CRM: Patient is calling in to state she does not like the [facility] pharmacy. She said she prefers them to send her medicine to her house but it is never on time. She is wanting all her meds sent to one place. She is requesting all of her meds sent to the [facility] pharmacy on file. She is out of her medications and she is getting frustrated with the back and forth and not having her medications on time. Please call patient as she wants to discuss this with someone to get it resolved.

## 2023-03-28 ENCOUNTER — Other Ambulatory Visit: Payer: Self-pay | Admitting: Critical Care Medicine

## 2023-04-01 ENCOUNTER — Ambulatory Visit: Payer: Self-pay | Admitting: Family Medicine

## 2023-04-01 DIAGNOSIS — G8929 Other chronic pain: Secondary | ICD-10-CM

## 2023-04-01 NOTE — Telephone Encounter (Signed)
 Patient is requesting prednisone medication.

## 2023-04-01 NOTE — Telephone Encounter (Signed)
  Chief Complaint: left knee and foot swelling Symptoms: painful left knee and left foot swelling Frequency: x 2 days Pertinent Negatives: Patient denies chest pain, SOB Disposition: [] ED /[] Urgent Care (no appt availability in office) / [x] Appointment(In office/virtual)/ []  [facility]/ [] Home Care/ [x] Refused Recommended Disposition /[] [facility]/ []  Follow-up with PCP Additional Notes: Patient states she has been taking Percocet which has been helping, but is requesting prednisone. She is also requesting refill of metformin, states she ran out. Patient states she does not want to come in for an office visit, during triage assessment patient interrupted RN's attempt to ask the questions and states "I just need the prednisone".  Copied from CRM [id number]. Topic: Clinical - Red Word Triage >> Apr 01, 2023  7:55 AM Payton Doughty wrote: Red Word that prompted transfer to Nurse Triage: pt knee and foot swelling.  She says it is gout. Reason for Disposition  MILD or MODERATE swelling (e.g., can't move joint normally, can't do usual activities) (Exceptions: Itchy, localized swelling; swelling is chronic.)  Answer Assessment - Initial Assessment Questions 1. LOCATION: "Where is the swelling located?"  (e.g., left, right, both knees)     Left.  2. ONSET: "When did the swelling start?" "Does it come and go, or is it there all the time?"     2 days ago, comes and goes.  3. SWELLING: "How bad is the swelling?" Or, "How large is it?" (e.g., mild, moderate, severe; size of localized swelling)    - NONE: No joint swelling.   - LOCALIZED: Localized; small area of puffy or swollen skin (e.g., insect bite, skin irritation).   - MILD: Joint looks or feels mildly swollen or puffy.   - MODERATE: Swollen; interferes with normal activities (e.g., work or school); can't move joint normally (bend and straighten completely); may be limping.   - SEVERE: Very swollen; can't move swollen joint at all;  limping a lot or unable to walk.     Moderate.  4. PAIN: "Is there any pain?" If Yes, ask: "How bad is it?" (Scale 1-10; or mild, moderate, severe)   - NONE (0): no pain.   - MILD (1-3): doesn't interfere with normal activities.    - MODERATE (4-7): interferes with normal activities (e.g., work or school) or awakens from sleep, limping.    - SEVERE (8-10): excruciating pain, unable to do any normal activities, unable to walk.      Yes, 10/10. Patient denies taking any pain medication today for the pain.  5. SETTING: "Has there been any recent work, exercise or other activity that involved that part of the body?"      Denies.  6. AGGRAVATING FACTORS: "What makes the knee swelling worse?" (e.g., walking, climbing stairs, running)     Denies.  7. ASSOCIATED SYMPTOMS: "Is there any pain or redness?"     Denies redness, states it is painful.  8. OTHER SYMPTOMS: "Do you have any other symptoms?" (e.g., chest pain, difficulty breathing, fever, calf pain)     Left foot swelling.  Protocols used: Knee Swelling-A-AH

## 2023-04-02 DIAGNOSIS — M25562 Pain in left knee: Secondary | ICD-10-CM | POA: Diagnosis not present

## 2023-04-02 DIAGNOSIS — E1169 Type 2 diabetes mellitus with other specified complication: Secondary | ICD-10-CM | POA: Diagnosis not present

## 2023-04-02 DIAGNOSIS — Z79899 Other long term (current) drug therapy: Secondary | ICD-10-CM | POA: Diagnosis not present

## 2023-04-02 MED ORDER — PREDNISONE 20 MG PO TABS: 20.0000 mg | ORAL_TABLET | Freq: Every day | ORAL | 0 refills | Status: DC

## 2023-04-02 MED ORDER — METFORMIN HCL 500 MG PO TABS: 500.0000 mg | ORAL_TABLET | Freq: Every day | ORAL | 0 refills | Status: DC

## 2023-04-02 NOTE — Addendum Note (Signed)
 Addended by: Hoy Register on: 04/02/2023 08:34 AM   Modules accepted: Orders

## 2023-04-02 NOTE — Telephone Encounter (Signed)
 Pt called and informed of medication being sent.

## 2023-04-02 NOTE — Telephone Encounter (Signed)
Prescriptions have been sent to her pharmacy

## 2023-04-06 DIAGNOSIS — Z79899 Other long term (current) drug therapy: Secondary | ICD-10-CM | POA: Diagnosis not present

## 2023-04-13 ENCOUNTER — Other Ambulatory Visit: Payer: Self-pay

## 2023-04-13 ENCOUNTER — Emergency Department (HOSPITAL_COMMUNITY)
Admission: EM | Admit: 2023-04-13 | Discharge: 2023-04-13 | Disposition: A | Discharge status: Home / Self Care | Attending: Emergency Medicine | Admitting: Emergency Medicine

## 2023-04-13 ENCOUNTER — Emergency Department (HOSPITAL_COMMUNITY)

## 2023-04-13 DIAGNOSIS — Z79899 Other long term (current) drug therapy: Secondary | ICD-10-CM | POA: Insufficient documentation

## 2023-04-13 DIAGNOSIS — I10 Essential (primary) hypertension: Secondary | ICD-10-CM | POA: Insufficient documentation

## 2023-04-13 DIAGNOSIS — R000 Tachycardia, unspecified: Secondary | ICD-10-CM | POA: Diagnosis not present

## 2023-04-13 DIAGNOSIS — J40 Bronchitis, not specified as acute or chronic: Secondary | ICD-10-CM | POA: Diagnosis not present

## 2023-04-13 DIAGNOSIS — J9801 Acute bronchospasm: Secondary | ICD-10-CM | POA: Diagnosis not present

## 2023-04-13 DIAGNOSIS — R0602 Shortness of breath: Secondary | ICD-10-CM | POA: Diagnosis not present

## 2023-04-13 DIAGNOSIS — R059 Cough, unspecified: Secondary | ICD-10-CM | POA: Diagnosis not present

## 2023-04-13 DIAGNOSIS — J111 Influenza due to unidentified influenza virus with other respiratory manifestations: Secondary | ICD-10-CM

## 2023-04-13 LAB — CBC WITH DIFFERENTIAL/PLATELET
Abs Immature Granulocytes: 0.1 10*3/uL — ABNORMAL HIGH (ref 0.00–0.07)
Basophils Absolute: 0.1 10*3/uL (ref 0.0–0.1)
Basophils Relative: 0 %
Eosinophils Absolute: 0 10*3/uL (ref 0.0–0.5)
Eosinophils Relative: 0 %
HCT: 46.3 % — ABNORMAL HIGH (ref 36.0–46.0)
Hemoglobin: 14.6 g/dL (ref 12.0–15.0)
Immature Granulocytes: 1 %
Lymphocytes Relative: 14 %
Lymphs Abs: 1.9 10*3/uL (ref 0.7–4.0)
MCH: 28 pg (ref 26.0–34.0)
MCHC: 31.5 g/dL (ref 30.0–36.0)
MCV: 88.9 fL (ref 80.0–100.0)
Monocytes Absolute: 0.8 10*3/uL (ref 0.1–1.0)
Monocytes Relative: 6 %
Neutro Abs: 10.5 10*3/uL — ABNORMAL HIGH (ref 1.7–7.7)
Neutrophils Relative %: 79 %
Platelets: 325 10*3/uL (ref 150–400)
RBC: 5.21 MIL/uL — ABNORMAL HIGH (ref 3.87–5.11)
RDW: 14 % (ref 11.5–15.5)
WBC: 13.4 10*3/uL — ABNORMAL HIGH (ref 4.0–10.5)
nRBC: 0 % (ref 0.0–0.2)

## 2023-04-13 LAB — COMPREHENSIVE METABOLIC PANEL
ALT: 18 U/L (ref 0–44)
AST: 21 U/L (ref 15–41)
Albumin: 4.5 g/dL (ref 3.5–5.0)
Alkaline Phosphatase: 60 U/L (ref 38–126)
Anion gap: 12 (ref 5–15)
BUN: 21 mg/dL — ABNORMAL HIGH (ref 6–20)
CO2: 23 mmol/L (ref 22–32)
Calcium: 10 mg/dL (ref 8.9–10.3)
Chloride: 104 mmol/L (ref 98–111)
Creatinine, Ser: 0.83 mg/dL (ref 0.44–1.00)
GFR, Estimated: >60 mL/min (ref >60)
Glucose, Bld: 92 mg/dL (ref 70–99)
Potassium: 3.3 mmol/L — ABNORMAL LOW (ref 3.5–5.1)
Sodium: 139 mmol/L (ref 135–145)
Total Bilirubin: 0.6 mg/dL (ref 0.0–1.2)
Total Protein: 8.4 g/dL — ABNORMAL HIGH (ref 6.5–8.1)

## 2023-04-13 LAB — I-STAT CG4 LACTIC ACID, ED: Lactic Acid, Venous: 1.8 mmol/L (ref 0.5–1.9)

## 2023-04-13 LAB — RESP PANEL BY RT-PCR (RSV, FLU A&B, COVID)  RVPGX2
Influenza A by PCR: NEGATIVE
Influenza B by PCR: NEGATIVE
Resp Syncytial Virus by PCR: NEGATIVE
SARS Coronavirus 2 by RT PCR: NEGATIVE

## 2023-04-13 LAB — SARS CORONAVIRUS 2 BY RT PCR: SARS Coronavirus 2 by RT PCR: NEGATIVE

## 2023-04-13 MED ORDER — ACETAMINOPHEN 325 MG PO TABS: 650.0000 mg | ORAL_TABLET | Freq: Once | ORAL | Status: DC

## 2023-04-13 MED ORDER — ACETAMINOPHEN 325 MG PO TABS
650.0000 mg | ORAL_TABLET | Freq: Once | ORAL | Status: AC | PRN
  Administered 2023-04-13: 650 mg via ORAL
  Filled 2023-04-13: qty 2

## 2023-04-13 MED ORDER — DOXYCYCLINE HYCLATE 100 MG PO CAPS: 100.0000 mg | ORAL_CAPSULE | Freq: Two times a day (BID) | ORAL | 0 refills | Status: DC

## 2023-04-13 MED ORDER — HYDROCOD POLI-CHLORPHE POLI ER 10-8 MG/5ML PO SUER: 5.0000 mL | Freq: Two times a day (BID) | ORAL | 0 refills | Status: DC | PRN

## 2023-04-13 MED ORDER — ALBUTEROL SULFATE HFA 108 (90 BASE) MCG/ACT IN AERS
2.0000 | INHALATION_SPRAY | Freq: Once | RESPIRATORY_TRACT | Status: AC
Start: 2023-04-13 — End: 2023-04-13
  Administered 2023-04-13: 2 via RESPIRATORY_TRACT
  Filled 2023-04-13: qty 6.7

## 2023-04-13 MED ORDER — SODIUM CHLORIDE 0.9 % IV BOLUS
1000.0000 mL | Freq: Once | INTRAVENOUS | Status: AC
  Administered 2023-04-13: 1000 mL via INTRAVENOUS

## 2023-04-13 MED ORDER — ACETAMINOPHEN 500 MG PO TABS
1000.0000 mg | ORAL_TABLET | Freq: Once | ORAL | Status: DC
  Filled 2023-04-13: qty 2

## 2023-04-13 MED ORDER — DOXYCYCLINE HYCLATE 100 MG PO TABS
100.0000 mg | ORAL_TABLET | Freq: Once | ORAL | Status: AC
  Administered 2023-04-13: 100 mg via ORAL
  Filled 2023-04-13: qty 1

## 2023-04-13 MED ORDER — PREDNISONE 20 MG PO TABS: ORAL_TABLET | ORAL | 0 refills | Status: DC

## 2023-04-13 MED ORDER — ACETAMINOPHEN 325 MG PO TABS
650.0000 mg | ORAL_TABLET | Freq: Once | ORAL | Status: AC
  Administered 2023-04-13: 650 mg via ORAL
  Filled 2023-04-13: qty 2

## 2023-04-13 MED ORDER — IPRATROPIUM-ALBUTEROL 0.5-2.5 (3) MG/3ML IN SOLN
3.0000 mL | Freq: Once | RESPIRATORY_TRACT | Status: AC
  Administered 2023-04-13: 3 mL via RESPIRATORY_TRACT
  Filled 2023-04-13: qty 3

## 2023-04-13 NOTE — ED Notes (Signed)
 PT refuse hand stick .Attempt x2 unsuccessful lab draws.  [facility])

## 2023-04-13 NOTE — Discharge Instructions (Addendum)
 Use the inhaler every 4-6 hours as needed for shortness of breath.  Follow-up with your family doctor later this week

## 2023-04-13 NOTE — ED Notes (Signed)
 Gave patient medication and discharge papers and let the rail down so patient can get dressed. She was informed of discharge papers and medications sent to the pharmacy. Patient refusing to leave.Security was called.

## 2023-04-13 NOTE — ED Provider Notes (Signed)
[place] EMERGENCY DEPARTMENT AT [facility] Provider Note   CSN: [id number] Arrival date & time: 04/13/23  1442     History {Add pertinent medical, surgical, social history, OB history to HPI:1} Chief Complaint  Patient presents with   Cough   Generalized Body Aches   Fever    Sydney Harris is a 58 y.o. female.  Patient has history of hypertension.  Patient complains of cough and shortness of breath for few days   Cough Associated symptoms: fever   Fever Associated symptoms: cough        Home Medications Prior to Admission medications   Medication Sig Start Date End Date Taking? Authorizing Provider  doxycycline (VIBRAMYCIN) 100 MG capsule Take 1 capsule (100 mg total) by mouth 2 (two) times daily. One po bid x 7 days 04/13/23  Yes Bethann Berkshire, MD  predniSONE (DELTASONE) 20 MG tablet 2 tabs po daily x 3 days 04/13/23  Yes Bethann Berkshire, MD  ARIPiprazole (ABILIFY) 15 MG tablet Take 1 tablet (15 mg total) by mouth at bedtime. 07/03/22   Storm Frisk, MD  atorvastatin (LIPITOR) 10 MG tablet Take 1 tablet by mouth once daily 09/04/22   Storm Frisk, MD  baclofen (LIORESAL) 10 MG tablet TAKE 1 TABLET THREE TIMES DAILY 02/23/23   Hoy Register, MD  benztropine (COGENTIN) 0.5 MG tablet Take 1 tablet (0.5 mg total) by mouth at bedtime. 07/03/22   Storm Frisk, MD  Blood Glucose Monitoring Suppl (GNP TRUE METRIX AIR METER) w/Device KIT Use to test blood sugar 3 times a day 09/30/22   Storm Frisk, MD  escitalopram (LEXAPRO) 5 MG tablet Take 1 tablet (5 mg total) by mouth daily. 07/03/22   Storm Frisk, MD  gabapentin (NEURONTIN) 300 MG capsule Take 1 capsule (300 mg total) by mouth 3 (three) times daily. 12/21/22   Claiborne Rigg, NP  glucose blood (GNP TRUE METRIX GLUCOSE STRIPS) test strip Use as instructed 09/30/22   Storm Frisk, MD  latanoprost (XALATAN) 0.005 % ophthalmic solution Place 1 drop into both eyes at bedtime. 01/29/22    [provider]  losartan-hydrochlorothiazide (HYZAAR) 50-12.5 MG tablet Take 1 tablet by mouth daily. 09/30/22   Storm Frisk, MD  meloxicam (MOBIC) 7.5 MG tablet TAKE 1 TABLET EVERY DAY 01/21/23   Hoy Register, MD  metFORMIN (GLUCOPHAGE) 500 MG tablet Take 1 tablet (500 mg total) by mouth daily with breakfast. 04/02/23   Hoy Register, MD  Misc. Devices [employer] Dagnosis- left knee pain 11/19/22   Hoy Register, MD  Multiple Vitamin (MULTIVITAMIN ADULT PO) Take by mouth daily.    [provider]  Multiple Vitamins-Minerals (HAIR SKIN AND NAILS FORMULA PO) Take by mouth daily.    [provider]  naloxone [facility]) nasal spray 4 mg/0.1 mL SMARTSIG:Both Nares 09/21/20   [provider]  omeprazole (PRILOSEC) 40 MG capsule Take 1 capsule by mouth once daily 01/20/23   Hoy Register, MD  traZODone (DESYREL) 100 MG tablet TAKE 1 & 1/2 (ONE & ONE-HALF) TABLETS BY MOUTH AT BEDTIME AS NEEDED 07/03/22   Storm Frisk, MD  TRUEplus Lancets 33G MISC Use to test blood sugar 3 times a day 09/30/22   Storm Frisk, MD  Vitamin D, Ergocalciferol, (DRISDOL) 1.25 MG (50000 UNIT) CAPS capsule Take 1 capsule (50,000 Units total) by mouth once a week. 09/30/22   Storm Frisk, MD      Allergies    Penicillins and  Tomato    Review of Systems   Review of Systems  Constitutional:  Positive for fever.  Respiratory:  Positive for cough.     Physical Exam Updated Vital Signs BP (!) 124/110   Pulse 91   Temp 99.7 F (37.6 C) (Oral)   Resp (!) 25   LMP 12/28/2010   SpO2 100%  Physical Exam  ED Results / Procedures / Treatments   Labs (all labs ordered are listed, but only abnormal results are displayed) Labs Reviewed  COMPREHENSIVE METABOLIC PANEL - Abnormal; Notable for the following components:      Result Value   Potassium 3.3 (*)    BUN 21 (*)    Total Protein 8.4 (*)    All other components within normal limits  CBC WITH  DIFFERENTIAL/PLATELET - Abnormal; Notable for the following components:   WBC 13.4 (*)    RBC 5.21 (*)    HCT 46.3 (*)    Neutro Abs 10.5 (*)    Abs Immature Granulocytes 0.10 (*)    All other components within normal limits  SARS CORONAVIRUS 2 BY RT PCR  RESP PANEL BY RT-PCR (RSV, FLU A&B, COVID)  RVPGX2  I-STAT CG4 LACTIC ACID, ED    EKG None  Radiology DG Chest 2 View Result Date: 04/13/2023 CLINICAL DATA:  Cough.  Body aches. EXAM: CHEST - 2 VIEW COMPARISON:  11/28/2019. FINDINGS: Linear area of atelectasis/scarring noted overlying the left lower lung zone. Bilateral lung fields are otherwise clear. No acute consolidation or lung collapse. Bilateral costophrenic angles are clear. Normal cardio-mediastinal silhouette. No acute osseous abnormalities. The soft tissues are within normal limits. IMPRESSION: No active cardiopulmonary disease. Electronically Signed   By: Jules Schick M.D.   On: 04/13/2023 16:58    Procedures Procedures  {Document cardiac monitor, telemetry assessment procedure when appropriate:1}  Medications Ordered in ED Medications  albuterol (VENTOLIN HFA) 108 (90 Base) MCG/ACT inhaler 2 puff (has no administration in time range)  acetaminophen (TYLENOL) tablet 650 mg (650 mg Oral Given 04/13/23 1457)  sodium chloride 0.9 % bolus 1,000 mL (1,000 mLs Intravenous New Bag/Given 04/13/23 1858)  ipratropium-albuterol (DUONEB) 0.5-2.5 (3) MG/3ML nebulizer solution 3 mL (3 mLs Nebulization Given 04/13/23 1858)    ED Course/ Medical Decision Making/ A&P   {   Click here for ABCD2, HEART and other calculatorsREFRESH Note before signing :1}                              Medical Decision Making Amount and/or Complexity of Data Reviewed Labs: ordered. Radiology: ordered.  Risk OTC drugs. Prescription drug management.   Patient with bronchitis and bronchospasm.  Patient sent home on prednisone albuterol and doxycycline  {Document critical care time when  appropriate:1} {Document review of labs and clinical decision tools ie heart score, Chads2Vasc2 etc:1}  {Document your independent review of radiology images, and any outside records:1} {Document your discussion with family members, caretakers, and with consultants:1} {Document social determinants of health affecting pt's care:1} {Document your decision making why or why not admission, treatments were needed:1} Final Clinical Impression(s) / ED Diagnoses Final diagnoses:  Influenza-like illness    Rx / DC Orders ED Discharge Orders          Ordered    doxycycline (VIBRAMYCIN) 100 MG capsule  2 times daily        04/13/23 2014    predniSONE (DELTASONE) 20 MG tablet  04/13/23 2014            

## 2023-04-13 NOTE — ED Notes (Signed)
 Pt refusing to wear cardiac monitoring equipment for previous shift. This RN went into pts room and placed pt back on monitor. Pt slow to cooperate and proceeds to pull blankets up blocking RN from placing pt on leads. Repeated to pt she is here for a respiratory complaint and she needs to be on the cardiac monitor. Pt then states "I'm the same color you are, don't mistreat me" RN responded stating "Your skin color does not change how we take care of you and I'm going to do my job correctly and make sure your vitals can be monitored." Pt continued to yell at RN, tech walked in to help. Pt continued to yell at both staff members saying she is being mistreated and we have ruined her night.

## 2023-04-13 NOTE — ED Notes (Signed)
 Pt continued to refuse to leave. Security & [employer] informed pt she needed to get dressed. Security & [employer] had to escort pt out of department.

## 2023-04-13 NOTE — ED Triage Notes (Signed)
 BIBA from home for flu like s/s since Friday, c/o cough, body aches. 104 HR 122/82 BP

## 2023-04-13 NOTE — ED Notes (Signed)
 Pt ripped off cardiac leads, not wearing pulse oximetry

## 2023-04-13 NOTE — ED Notes (Signed)
 Pt refused to let tech to take them off monitor.

## 2023-04-14 NOTE — ED Notes (Signed)
 04/14/23 0850 Pharmacy called with question about meds, chart opened to answer questions.

## 2023-04-18 ENCOUNTER — Other Ambulatory Visit: Payer: Self-pay | Admitting: Family Medicine

## 2023-04-20 NOTE — Telephone Encounter (Signed)
 Requested Prescriptions  Pending Prescriptions Disp Refills   omeprazole (PRILOSEC) 40 MG capsule [Pharmacy Med Name: OMEPRAZOLE DR 40MG   CAP] 90 capsule 0    Sig: Take 1 capsule by mouth once daily     Gastroenterology: Proton Pump Inhibitors Passed - 04/20/2023  5:07 PM      Passed - Valid encounter within last 12 months    Recent Outpatient Visits           5 months ago Type 2 diabetes mellitus with hyperglycemia, without long-term current use of insulin (HCC)   [facility] [place]. [facility] Hoy Register, MD   8 months ago Chronic pain of left knee   [facility] [place]. [facility] Storm Frisk, MD   9 months ago Type 2 diabetes mellitus with hyperglycemia, without long-term current use of insulin [facility])   [facility] [place]. [facility] Storm Frisk, MD   1 year ago Type 2 diabetes mellitus with hyperglycemia, without long-term current use of insulin [facility])   [facility] Of . [facility] Storm Frisk, MD   1 year ago Type 2 diabetes mellitus with hyperglycemia, without long-term current use of insulin [facility])   [facility] - [facility] Of . [facility] Storm Frisk, MD

## 2023-05-04 DIAGNOSIS — R5383 Other fatigue: Secondary | ICD-10-CM | POA: Diagnosis not present

## 2023-05-04 DIAGNOSIS — Z79899 Other long term (current) drug therapy: Secondary | ICD-10-CM | POA: Diagnosis not present

## 2023-05-04 DIAGNOSIS — D539 Nutritional anemia, unspecified: Secondary | ICD-10-CM | POA: Diagnosis not present

## 2023-05-04 DIAGNOSIS — M1712 Unilateral primary osteoarthritis, left knee: Secondary | ICD-10-CM | POA: Diagnosis not present

## 2023-05-04 DIAGNOSIS — M25511 Pain in right shoulder: Secondary | ICD-10-CM | POA: Diagnosis not present

## 2023-05-04 DIAGNOSIS — M129 Arthropathy, unspecified: Secondary | ICD-10-CM | POA: Diagnosis not present

## 2023-05-04 DIAGNOSIS — E7800 Pure hypercholesterolemia, unspecified: Secondary | ICD-10-CM | POA: Diagnosis not present

## 2023-05-04 DIAGNOSIS — E1169 Type 2 diabetes mellitus with other specified complication: Secondary | ICD-10-CM | POA: Diagnosis not present

## 2023-05-04 DIAGNOSIS — M5134 Other intervertebral disc degeneration, thoracic region: Secondary | ICD-10-CM | POA: Diagnosis not present

## 2023-05-05 ENCOUNTER — Encounter: Payer: Self-pay | Admitting: Family Medicine

## 2023-05-05 ENCOUNTER — Ambulatory Visit: Attending: Family Medicine | Admitting: Family Medicine

## 2023-05-05 VITALS — BP 124/85 | HR 85 | Wt 197.8 lb

## 2023-05-05 DIAGNOSIS — E1159 Type 2 diabetes mellitus with other circulatory complications: Secondary | ICD-10-CM

## 2023-05-05 DIAGNOSIS — M7501 Adhesive capsulitis of right shoulder: Secondary | ICD-10-CM

## 2023-05-05 DIAGNOSIS — F319 Bipolar disorder, unspecified: Secondary | ICD-10-CM | POA: Diagnosis not present

## 2023-05-05 DIAGNOSIS — G894 Chronic pain syndrome: Secondary | ICD-10-CM

## 2023-05-05 DIAGNOSIS — E876 Hypokalemia: Secondary | ICD-10-CM | POA: Diagnosis not present

## 2023-05-05 DIAGNOSIS — E1121 Type 2 diabetes mellitus with diabetic nephropathy: Secondary | ICD-10-CM

## 2023-05-05 DIAGNOSIS — Z7984 Long term (current) use of oral hypoglycemic drugs: Secondary | ICD-10-CM | POA: Diagnosis not present

## 2023-05-05 DIAGNOSIS — M7502 Adhesive capsulitis of left shoulder: Secondary | ICD-10-CM

## 2023-05-05 DIAGNOSIS — E1149 Type 2 diabetes mellitus with other diabetic neurological complication: Secondary | ICD-10-CM

## 2023-05-05 DIAGNOSIS — I10 Essential (primary) hypertension: Secondary | ICD-10-CM | POA: Diagnosis not present

## 2023-05-05 DIAGNOSIS — E1169 Type 2 diabetes mellitus with other specified complication: Secondary | ICD-10-CM

## 2023-05-05 LAB — POCT GLYCOSYLATED HEMOGLOBIN (HGB A1C): HbA1c, POC (controlled diabetic range): 6.2 % (ref 0.0–7.0)

## 2023-05-05 LAB — GLUCOSE, POCT (MANUAL RESULT ENTRY): POC Glucose: 99 mg/dL (ref 70–99)

## 2023-05-05 MED ORDER — METFORMIN HCL 500 MG PO TABS: 500.0000 mg | ORAL_TABLET | Freq: Every day | ORAL | 1 refills | Status: DC

## 2023-05-05 MED ORDER — LOSARTAN POTASSIUM-HCTZ 50-12.5 MG PO TABS: 1.0000 | ORAL_TABLET | Freq: Every day | ORAL | 1 refills | Status: DC

## 2023-05-05 MED ORDER — MELOXICAM 7.5 MG PO TABS: 7.5000 mg | ORAL_TABLET | Freq: Every day | ORAL | 1 refills | Status: DC

## 2023-05-05 MED ORDER — BACLOFEN 10 MG PO TABS: 10.0000 mg | ORAL_TABLET | Freq: Three times a day (TID) | ORAL | 1 refills | Status: DC

## 2023-05-05 MED ORDER — GABAPENTIN 300 MG PO CAPS
300.0000 mg | ORAL_CAPSULE | Freq: Three times a day (TID) | ORAL | 1 refills | Status: DC
Start: 2023-05-05 — End: 2023-11-18

## 2023-05-05 NOTE — Progress Notes (Signed)
 Subjective:  Patient ID: Sydney Harris, female    DOB: [birth year]  Age: 58 y.o. MRN: [id number]  CC: Medication Refill and Medical Management of Chronic Issues     Discussed the use of AI scribe software for clinical note transcription with the patient, who gave verbal consent to proceed.  History of Present Illness The patient, with a history of type 2 diabetes mellitus, hypertension bipolar disorder, schizophrenia, previous substance (cocaine) abuse and chronic pain, presents with ongoing issues with her previous pain clinic, [facility]. She reports feeling mistreated and disrespected at the clinic, leading to her decision to seek care elsewhere. The patient has a history of crack cocaine use, with twelve years of sobriety. She has been using marijuana and marijuana-infused gummies for relaxation and pain management, which has led to conflicts with her previous pain clinic. The patient reports severe chronic pain in her shoulders and knees, which has been unmanaged for the past three months due to issues with her previous pain clinic.  She did receive pain medication from [facility] pain management but feels that the new condition is biased due to her experience with the previous clinician in that practice and would like to transfer her care elsewhere.  The pain is so severe that it affects her ability to move her arms and sleep at night. The patient also reports a history of neuropathy in her hands.  I had referred her to psychiatry for management of her bipolar disorder but she is yet to receive an appointment from them. Her diabetes is controlled on metformin.  Past Medical History:  Diagnosis Date   Anxiety    Arthritis of left knee    Bipolar disorder (HCC)    Blood transfusion 1981   "related to my daughter being born"   Depression    Diabetes mellitus without complication (HCC)    Fibroid tumor    Gout    Hypertension    Plantar fasciitis    Substance abuse (HCC)     crack cocaine  14 yrs sober.    Past Surgical History:  Procedure Laterality Date   ABDOMINAL HYSTERECTOMY     "for fibroid tumors"    Family History  Problem Relation Age of Onset   Diabetes Father    Asthma Daughter    Heart failure Neg Hx    Cancer Neg Hx    Colon polyps Neg Hx    Colon cancer Neg Hx    Esophageal cancer Neg Hx    Rectal cancer Neg Hx    Stomach cancer Neg Hx     Social History   Socioeconomic History   Marital status: Single    Spouse name: Not on file   Number of children: Not on file   Years of education: Not on file   Highest education level: 9th grade  Occupational History   Not on file  Tobacco Use   Smoking status: Former    Current packs/day: 0.00    Average packs/day: 0.5 packs/day for 33.0 years (16.5 ttl pk-yrs)    Types: Cigarettes    Start date: 45    Quit date: 2018    Years since quitting: 7.2   Smokeless tobacco: Never  Vaping Use   Vaping status: Never Used  Substance and Sexual Activity   Alcohol use: No   Drug use: Not Currently    Types: "Crack" cocaine, Marijuana    Comment: clean since 2015; currently chews CBD gummies   Sexual activity: Yes  Partners: Male  Other Topics Concern   Not on file  Social History Narrative   Not on file   Social Drivers of Health   Financial Resource Strain: Low Risk  (09/10/2022)   Overall Financial Resource Strain (CARDIA)    Difficulty of Paying Living Expenses: Not hard at all  Food Insecurity: Patient Declined (12/06/2022)   Hunger Vital Sign    Worried About Running Out of Food in the Last Year: Patient declined    Ran Out of Food in the Last Year: Patient declined  Transportation Needs: Patient Declined (12/06/2022)   PRAPARE - Administrator, Civil Service (Medical): Patient declined    Lack of Transportation (Non-Medical): Patient declined  Physical Activity: Inactive (09/10/2022)   Exercise Vital Sign    Days of Exercise per Week: 0 days    Minutes of  Exercise per Session: 0 min  Stress: No Stress Concern Present (09/10/2022)   [employer] of Occupational Health - Occupational Stress Questionnaire    Feeling of Stress : Not at all  Social Connections: Moderately Isolated (09/10/2022)   Social Connection and Isolation Panel [NHANES]    Frequency of Communication with Friends and Family: Twice a week    Frequency of Social Gatherings with Friends and Family: Three times a week    Attends Religious Services: More than 4 times per year    Active Member of Clubs or Organizations: No    Attends Banker Meetings: Never    Marital Status: Never married    Allergies  Allergen Reactions   Penicillins Hives    Has patient had a PCN reaction causing immediate rash, facial/tongue/throat swelling, SOB or lightheadedness with hypotension: Yes Has patient had a PCN reaction causing severe rash involving mucus membranes or skin necrosis: Yes Has patient had a PCN reaction that required hospitalization No Has patient had a PCN reaction occurring within the last 10 years: Yes If all of the above answers are "NO", then may proceed with Cephalosporin use.    Tomato     hives    Outpatient Medications Prior to Visit  Medication Sig Dispense Refill   ARIPiprazole (ABILIFY) 15 MG tablet Take 1 tablet (15 mg total) by mouth at bedtime. 30 tablet 4   atorvastatin (LIPITOR) 10 MG tablet Take 1 tablet by mouth once daily 90 tablet 1   benztropine (COGENTIN) 0.5 MG tablet Take 1 tablet (0.5 mg total) by mouth at bedtime. 30 tablet 4   Blood Glucose Monitoring Suppl (GNP TRUE METRIX AIR METER) w/Device KIT Use to test blood sugar 3 times a day 1 kit 0   chlorpheniramine-HYDROcodone (TUSSIONEX) 10-8 MG/5ML Take 5 mLs by mouth every 12 (twelve) hours as needed for cough. 70 mL 0   doxycycline (VIBRAMYCIN) 100 MG capsule Take 1 capsule (100 mg total) by mouth 2 (two) times daily. One po bid x 7 days 14 capsule 0   escitalopram (LEXAPRO) 5 MG  tablet Take 1 tablet (5 mg total) by mouth daily. 30 tablet 4   glucose blood (GNP TRUE METRIX GLUCOSE STRIPS) test strip Use as instructed 100 each 12   latanoprost (XALATAN) 0.005 % ophthalmic solution Place 1 drop into both eyes at bedtime.     Misc. Devices [employer] Dagnosis- left knee pain 1 each 0   Multiple Vitamin (MULTIVITAMIN ADULT PO) Take by mouth daily.     Multiple Vitamins-Minerals (HAIR SKIN AND NAILS FORMULA PO) Take by mouth daily.     naloxone (NARCAN)  nasal spray 4 mg/0.1 mL SMARTSIG:Both Nares     omeprazole (PRILOSEC) 40 MG capsule Take 1 capsule by mouth once daily 90 capsule 0   predniSONE (DELTASONE) 20 MG tablet 2 tabs po daily x 3 days 6 tablet 0   traZODone (DESYREL) 100 MG tablet TAKE 1 & 1/2 (ONE & ONE-HALF) TABLETS BY MOUTH AT BEDTIME AS NEEDED 45 tablet 1   TRUEplus Lancets 33G MISC Use to test blood sugar 3 times a day 100 each 12   Vitamin D, Ergocalciferol, (DRISDOL) 1.25 MG (50000 UNIT) CAPS capsule Take 1 capsule (50,000 Units total) by mouth once a week. 12 capsule 4   baclofen (LIORESAL) 10 MG tablet TAKE 1 TABLET THREE TIMES DAILY 270 tablet 0   gabapentin (NEURONTIN) 300 MG capsule Take 1 capsule (300 mg total) by mouth 3 (three) times daily. 90 capsule 2   losartan-hydrochlorothiazide (HYZAAR) 50-12.5 MG tablet Take 1 tablet by mouth daily. 90 tablet 2   meloxicam (MOBIC) 7.5 MG tablet TAKE 1 TABLET EVERY DAY 90 tablet 0   metFORMIN (GLUCOPHAGE) 500 MG tablet Take 1 tablet (500 mg total) by mouth daily with breakfast. 90 tablet 0   No facility-administered medications prior to visit.     ROS Review of Systems  Constitutional:  Negative for activity change and appetite change.  HENT:  Negative for sinus pressure and sore throat.   Respiratory:  Negative for chest tightness, shortness of breath and wheezing.   Cardiovascular:  Negative for chest pain and palpitations.  Gastrointestinal:  Negative for abdominal distention, abdominal pain and  constipation.  Genitourinary: Negative.   Musculoskeletal:        See HPI  Psychiatric/Behavioral:  Negative for behavioral problems and dysphoric mood.     Objective:  BP 124/85 (BP Location: Right Arm, Patient Position: Sitting, Cuff Size: Normal)   Pulse 85   Wt 197 lb 12.8 oz (89.7 kg)   LMP 12/28/2010   SpO2 96%   BMI 35.04 kg/m      05/05/2023    3:20 PM 05/05/2023    3:19 PM 04/13/2023    8:45 PM  BP/Weight  Systolic BP 124  409  Diastolic BP 85  109  Wt. (Lbs)  197.8   BMI  35.04 kg/m2       Physical Exam Constitutional:      Appearance: She is well-developed.  Cardiovascular:     Rate and Rhythm: Normal rate.     Heart sounds: Normal heart sounds. No murmur heard. Pulmonary:     Effort: Pulmonary effort is normal.     Breath sounds: Normal breath sounds. No wheezing or rales.  Chest:     Chest wall: No tenderness.  Abdominal:     General: Bowel sounds are normal. There is no distension.     Palpations: Abdomen is soft. There is no mass.     Tenderness: There is no abdominal tenderness.  Musculoskeletal:        General: Normal range of motion.     Right lower leg: No edema.     Left lower leg: No edema.     Comments: Abduction of both upper extremities restricted to 60 degrees with associated tenderness on range of motion  Neurological:     Mental Status: She is alert and oriented to person, place, and time.  Psychiatric:        Mood and Affect: Mood normal.        Latest Ref Rng & Units 04/13/2023  6:19 PM 11/19/2022    9:12 AM 07/03/2022    5:07 PM  CMP  Glucose 70 - 99 mg/dL 92  130  96   BUN 6 - 20 mg/dL 21  16  20    Creatinine 0.44 - 1.00 mg/dL 8.65  7.84  6.96   Sodium 135 - 145 mmol/L 139  144  144   Potassium 3.5 - 5.1 mmol/L 3.3  4.6  4.1   Chloride 98 - 111 mmol/L 104  104  105   CO2 22 - 32 mmol/L 23  24  27    Calcium 8.9 - 10.3 mg/dL 29.5  28.4  13.2   Total Protein 6.5 - 8.1 g/dL 8.4   7.3   Total Bilirubin 0.0 - 1.2 mg/dL 0.6    0.3   Alkaline Phos 38 - 126 U/L 60   63   AST 15 - 41 U/L 21   19   ALT 0 - 44 U/L 18   17     Lipid Panel     Component Value Date/Time   CHOL 189 07/03/2022 1707   TRIG 86 07/03/2022 1707   HDL 74 07/03/2022 1707   CHOLHDL 2.6 07/03/2022 1707   LDLCALC 100 (H) 07/03/2022 1707    CBC    Component Value Date/Time   WBC 13.4 (H) 04/13/2023 1819   RBC 5.21 (H) 04/13/2023 1819   HGB 14.6 04/13/2023 1819   HGB 13.4 10/02/2021 1042   HCT 46.3 (H) 04/13/2023 1819   HCT 40.5 10/02/2021 1042   PLT 325 04/13/2023 1819   PLT 426 10/02/2021 1042   MCV 88.9 04/13/2023 1819   MCV 87 10/02/2021 1042   MCH 28.0 04/13/2023 1819   MCHC 31.5 04/13/2023 1819   RDW 14.0 04/13/2023 1819   RDW 12.3 10/02/2021 1042   LYMPHSABS 1.9 04/13/2023 1819   LYMPHSABS 3.7 (H) 10/02/2021 1042   MONOABS 0.8 04/13/2023 1819   EOSABS 0.0 04/13/2023 1819   EOSABS 0.2 10/02/2021 1042   BASOSABS 0.1 04/13/2023 1819   BASOSABS 0.1 10/02/2021 1042    Lab Results  Component Value Date   HGBA1C 6.2 05/05/2023       Assessment & Plan  Chronic Pain/bilateral adhesive capsulitis Chronic pain in shoulders, knees, and wrists likely due to arthritis. -Offered to refer to orthopedic as she may benefit from cortisone injection but she declines -Currently under the care of [facility] but she would like to transfer care elsewhere  Current medications provide inadequate relief. Declined invasive treatments. - Refill meloxicam prescription and send to pharmacy. - Refer to physical therapy for shoulder pain management. - I had previously referred her in 10/2022 to [facility] and I have provided her with the contact information  Bipolar Disorder Needs ongoing therapy to manage symptoms and prevent episodes. Acknowledged therapy's importance. -I had placed a referral in 10/2018 for but she was never contacted by psych - Refer to psychiatrist for management of bipolar disorder. - Ensure  appropriate follow-up and medication management.  Peripheral Neuropathy Numbness in hands consistent with peripheral neuropathy. Currently taking gabapentin. - Continue gabapentin as prescribed.  Hypertension Blood pressure well-controlled with current medication regimen. - Continue current antihypertensive medications.  Type 2 diabetes mellitus - Controlled with A1c of 6.2 - Continue metformin -Counseled on Diabetic diet, my plate method, 440 minutes of moderate intensity exercise/week Blood sugar logs with fasting goals of 80-120 mg/dl, random of less than 102 and in the event of sugars less than 60  mg/dl or greater than 440 mg/dl encouraged to notify the clinic. Advised on the need for annual eye exams, annual foot exams, Pneumonia vaccine.   Hypokalemia - Last potassium was 3.3 - Will send off potassium labs  Meds ordered this encounter  Medications   baclofen (LIORESAL) 10 MG tablet    Sig: Take 1 tablet (10 mg total) by mouth 3 (three) times daily.    Dispense:  270 tablet    Refill:  1   gabapentin (NEURONTIN) 300 MG capsule    Sig: Take 1 capsule (300 mg total) by mouth 3 (three) times daily.    Dispense:  90 capsule    Refill:  1    Mail to home   losartan-hydrochlorothiazide (HYZAAR) 50-12.5 MG tablet    Sig: Take 1 tablet by mouth daily.    Dispense:  90 tablet    Refill:  1    Mail to home   meloxicam (MOBIC) 7.5 MG tablet    Sig: Take 1 tablet (7.5 mg total) by mouth daily.    Dispense:  90 tablet    Refill:  1   metFORMIN (GLUCOPHAGE) 500 MG tablet    Sig: Take 1 tablet (500 mg total) by mouth daily with breakfast.    Dispense:  90 tablet    Refill:  1    Follow-up: Return in about 6 months (around 11/04/2023) for Chronic medical conditions.       Joaquin Mulberry, MD, FAAFP. [facility] [place], [place] [phone]   05/05/2023, 5:19 PM

## 2023-05-05 NOTE — Patient Instructions (Signed)
 Sent Referral to [facility] Ph. # [phone] / [phone]

## 2023-05-06 LAB — POTASSIUM: Potassium: 4.2 mmol/L (ref 3.5–5.2)

## 2023-05-07 ENCOUNTER — Other Ambulatory Visit: Payer: Self-pay | Admitting: Family Medicine

## 2023-05-07 DIAGNOSIS — F319 Bipolar disorder, unspecified: Secondary | ICD-10-CM

## 2023-05-12 DIAGNOSIS — Z79899 Other long term (current) drug therapy: Secondary | ICD-10-CM | POA: Diagnosis not present

## 2023-05-14 ENCOUNTER — Ambulatory Visit: Attending: Family Medicine | Admitting: Physical Therapy

## 2023-05-14 ENCOUNTER — Other Ambulatory Visit: Payer: Self-pay

## 2023-05-14 ENCOUNTER — Encounter: Payer: Self-pay | Admitting: Physical Therapy

## 2023-05-14 DIAGNOSIS — M7501 Adhesive capsulitis of right shoulder: Secondary | ICD-10-CM | POA: Insufficient documentation

## 2023-05-14 DIAGNOSIS — M6281 Muscle weakness (generalized): Secondary | ICD-10-CM | POA: Diagnosis not present

## 2023-05-14 DIAGNOSIS — M7502 Adhesive capsulitis of left shoulder: Secondary | ICD-10-CM | POA: Diagnosis not present

## 2023-05-14 DIAGNOSIS — M25512 Pain in left shoulder: Secondary | ICD-10-CM | POA: Diagnosis not present

## 2023-05-14 DIAGNOSIS — M25511 Pain in right shoulder: Secondary | ICD-10-CM

## 2023-05-14 NOTE — Therapy (Addendum)
 PHYSICAL THERAPY UNPLANNED DISCHARGE SUMMARY   Visits from Start of Care: 1  Current functional level related to goals / functional outcomes: Current status unknown   Remaining deficits: Current status unknown   Education / Equipment: Pt has not returned since visit listed below  Patient goals were not assessed. Patient is being discharged due to not returning since the last visit.  (the note below was addended to include the above D/C summary on 12/22/23)  OUTPATIENT PHYSICAL THERAPY SHOULDER EVALUATION   Patient Name: Sydney Harris MRN: [id number] DOB:[birth year], 58 y.o., female Today's Date: 05/14/2023   PT End of Session - 05/14/23 0911     Visit Number 1    Number of Visits --   1-2x/week   Date for PT Re-Evaluation 07/09/23    Authorization Type [employer] MCR    Progress Note Due on Visit 10    PT Start Time 0800    PT Stop Time 0839    PT Time Calculation (min) 39 min             Past Medical History:  Diagnosis Date   Anxiety    Arthritis of left knee    Bipolar disorder (HCC)    Blood transfusion 1981   related to my daughter being born   Depression    Diabetes mellitus without complication (HCC)    Fibroid tumor    Gout    Hypertension    Plantar fasciitis    Substance abuse (HCC)    crack cocaine  14 yrs sober.   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   Bipolar 1 disorder (HCC) 10/02/2021   Generalized anxiety disorder 10/02/2021   HTN (hypertension) 03/28/2020   Chronic bilateral low back pain without sciatica 12/28/2019   Hyperlipidemia associated with type 2 diabetes mellitus (HCC) 12/22/2019   Chronic pain of left knee 12/21/2019   Type 2 diabetes mellitus with hyperglycemia, without long-term current use of insulin (HCC)  12/21/2019    PCP: Delbert Clam, MD  REFERRING PROVIDER: Delbert Clam, MD  THERAPY DIAG:  Right shoulder pain, unspecified chronicity  Left shoulder pain, unspecified chronicity  Muscle weakness  REFERRING DIAG: Adhesive capsulitis of both shoulders [M75.01, M75.02]   Rationale for Evaluation and Treatment:  Rehabilitation  SUBJECTIVE:  PERTINENT PAST HISTORY:  Bipolar, DM II, anxiety, diabetic neuropathy         PRECAUTIONS: None  WEIGHT BEARING RESTRICTIONS No  FALLS:  Has patient fallen in last 6 months? No, Number of falls: 0  MOI/History of condition:  Onset date: 2 months  SUBJECTIVE STATEMENT  Sydney Harris is a 58 y.o. female who presents to clinic with chief complaint of bil shoulder pain R>L.  This started slowly with no acute injury or trauma.  She denies neck pain.  She feels a bit weak in her arms.  She has some chronic intemittent n/t in her L hand which started well before her shoulder pain.   Red flags:  denies   Pain:  Are you having pain? Yes Pain location: R > L shoulder NPRS scale:  5/10 to 10/10 Aggravating factors: sleeping, reaching above head, lifting Relieving factors: rest Pain description: dull and aching Stage: Subacute 24 hour pattern: worse at night and with activity   Occupation: na  Assistive Device: na  Hand Dominance: R  Patient Goals/Specific Activities: reduce pain   OBJECTIVE:   DIAGNOSTIC FINDINGS:  None recent or relevant in EPIC  GENERAL OBSERVATION: Rounded shoulders     SENSATION: Light touch: Appears intact   PALPATION: Significant diffuse TTP bil shoulders  UPPER EXTREMITY AROM:  ROM Right (Eval) Left (Eval)  Shoulder flexion 85* 110*  Shoulder abduction 80* 105*  Shoulder internal rotation    Shoulder external rotation    Functional IR GT* GT*  Functional ER C5* C5*  Shoulder extension    Elbow extension    Elbow flexion     (Blank rows = not tested, N = WNL, * =  concordant pain with testing)  UPPER EXTREMITY MMT:  MMT Right (Eval) Left (Eval)  Shoulder flexion 2+ in available  range* 3 in available  range*  Shoulder abduction (C5)    Shoulder ER 2+* 3*  Shoulder IR    Middle trapezius    Lower trapezius    Shoulder extension    Grip strength    Cervical flexion (C1,C2)    Cervical S/B (C3)    Shoulder shrug (C4)    Elbow flexion (C6)    Elbow ext (C7)    Thumb ext (C8)    Finger abd (T1)    Grossly     (Blank rows = not tested, score listed is out of 5 possible points.  N = WNL, D = diminished, C = clear for gross weakness with myotome testing, * = concordant pain with testing)  MUSCLE LENGTH:   Pec Major: L (+), R (+) for restriction  UPPER EXTREMITY PROM:  PROM Right (Eval) Left (Eval)  Shoulder flexion 100** 120*  Shoulder abduction    Shoulder internal rotation    Shoulder external rotation    Functional IR    Functional ER N* N*  Shoulder extension    Elbow extension    Elbow flexion     (Blank rows = not tested, N = WNL, * = concordant pain with testing)  SPECIAL TESTS: Painful arc (+), ER (+), Passive movement (+)  PATIENT SURVEYS:  Quick Dash: 51 pts    TODAY'S TREATMENT:  Therapeutic Exercise: Creating, reviewing, and completing below HEP   PATIENT EDUCATION ([facility]/[facility]):  POC, diagnosis, prognosis, HEP, and outcome measures.  Pt educated via explanation, demonstration, and handout (HEP).  Pt confirms understanding verbally.   HOME EXERCISE PROGRAM: Access Code: [email or web address] URL: [email or web address] Date: 05/14/2023 Prepared by: Helene Gasmen  Exercises - Isometric Shoulder Flexion at Wall  - 1 x daily - 7 x weekly - 2 sets - 10 reps - 5'' hold - Standing Shoulder Row with Anchored Resistance  - 1 x daily - 7 x weekly - 2 sets - 10 reps - Doorway Pec Stretch at 60 Elevation  - 1 x daily - 7 x weekly - 1 sets - 3 reps - 45 seconds hold  Treatment priorities   Eval        Gentle  progressive R/C and ROM                                          ASSESSMENT:  CLINICAL IMPRESSION: Sydney Harris is a 58 y.o. female who presents to clinic with signs and sxs consistent with bil shoulder pain.  DD includes degenerative changes, R/C, AC.  Given full ER lower likelihood of AC.  Given pain with all movements, dull pain at rest, past history of arthritis, and no MOI - DJD is likely but unable to rule out R/C.  Sydney Harris will benefit from skilled PT to address relevant deficits and improve comfort with daily activities.  OBJECTIVE IMPAIRMENTS: Pain, R>L shoulder ROM, bil shoulder strength  ACTIVITY LIMITATIONS: reaching, lifting, sleeping, housework  PERSONAL FACTORS: See medical history and pertinent history   REHAB POTENTIAL: Good  CLINICAL DECISION MAKING: Evolving/moderate complexity  EVALUATION COMPLEXITY: Moderate   GOALS:   SHORT TERM GOALS: Target date: 06/11/2023   Kandee will be >75% HEP compliant to improve carryover between sessions and facilitate independent management of condition  Evaluation: ongoing Goal status: INITIAL   LONG TERM GOALS: Target date: 07/09/2023   Sydney Harris will self report >/= 50% decrease in pain from evaluation to improve function in daily tasks  Evaluation/Baseline: 10/10 max pain Goal status: INITIAL   2.  Sydney Harris will demonstrate bil shoulder flexion >120 degrees of active ROM in flexion to allow completion of activities involving reaching OH, not limited by pain  Evaluation/Baseline: 110 degrees L, 85 degrees R   Goal status: INITIAL   3.  Sydney Harris will report a significant improvement in her sleep, not limited by pain  Evaluation/Baseline: limited Goal status: INITIAL   4.  Sydney Harris will show a >/= 15 pt improvement in her QUICK DASH score (MCID is 10% or ~5 pts) as a proxy for functional improvement   Evaluation/Baseline: 51 pts Goal status: INITIAL      PLAN: PT FREQUENCY: 1-2x/week  PT DURATION: 8  weeks  PLANNED INTERVENTIONS:  [id number]- PT Re-evaluation, 97110-Therapeutic exercises, 97530- Therapeutic activity, [id number]- Neuromuscular re-education, 97535- Self Care, [id number]- Manual therapy, [id number]- Gait training, [id number]- Aquatic Therapy, [id number]- Electrical stimulation (manual), [id number]- Vasopneumatic device, [id number]- Traction (mechanical), [id number]- Ionotophoresis 4mg /ml Dexamethasone , Taping, Dry Needling, Joint manipulation, and Spinal manipulation.   Jelina Paulsen PT, DPT 05/14/2023, 9:13 AM

## 2023-05-19 ENCOUNTER — Ambulatory Visit: Payer: Self-pay

## 2023-05-19 NOTE — Telephone Encounter (Signed)
  Chief Complaint: diarrhea "from Metformin " and bilateral shoulder pain Symptoms: moderate watery diarrhea- Frequency: "several" days Pertinent Negatives: Patient denies abd pain Disposition: [] ED /[] Urgent Care (no appt availability in office) / [x] Appointment(In office/virtual)/ []  [facility]/ [] Home Care/ [] Refused Recommended Disposition /[] [facility]/ []  Follow-up with PCP Additional Notes: Pt very curt and frustrated over bilateral shoulder pain. Although pt called in to report diarrhea from Metformin , her main focus was the shoulder pain. Pt stated she wanted a call back otherwise "I'll just come up there." Pt only wants to talk to PCP. Pt very adamant.  Copied from CRM [id number]. Topic: Clinical - Red Word Triage >> May 19, 2023  8:50 AM Lorenz Romano B wrote: [facility] that prompted transfer to Nurse Triage: patient is having diarrhea for several days due to  metFORMIN  (GLUCOPHAGE ) 500 MG Reason for Disposition . [1] MODERATE diarrhea (e.g., 4-6 times / day more than normal) AND [2] present > 48 hours (2 days)  Answer Assessment - Initial Assessment Questions 1. DIARRHEA SEVERITY: "How bad is the diarrhea?" "How many more stools have you had in the past 24 hours than normal?"    - NO DIARRHEA (SCALE 0)   - MILD (SCALE 1-3): Few loose or mushy BMs; increase of 1-3 stools over normal daily number of stools; mild increase in ostomy output.   -  MODERATE (SCALE 4-7): Increase of 4-6 stools daily over normal; moderate increase in ostomy output.   -  SEVERE (SCALE 8-10; OR "WORST POSSIBLE"): Increase of 7 or more stools daily over normal; moderate increase in ostomy output; incontinence.     moderate 2. ONSET: "When did the diarrhea begin?"      Last week 3. BM CONSISTENCY: "How loose or watery is the diarrhea?"      watery 4. VOMITING: "Are you also vomiting?" If Yes, ask: "How many times in the past 24 hours?"      no 5. ABDOMEN PAIN: "Are you having any abdomen pain?"  If Yes, ask: "What does it feel like?" (e.g., crampy, dull, intermittent, constant)      no 6. ABDOMEN PAIN SEVERITY: If present, ask: "How bad is the pain?"  (e.g., Scale 1-10; mild, moderate, or severe)   - MILD (1-3): doesn't interfere with normal activities, abdomen soft and not tender to touch    - MODERATE (4-7): interferes with normal activities or awakens from sleep, abdomen tender to touch    - SEVERE (8-10): excruciating pain, doubled over, unable to do any normal activities       N/a  Protocols used: [facility]

## 2023-05-20 ENCOUNTER — Encounter: Payer: Self-pay | Admitting: Physical Therapy

## 2023-05-20 ENCOUNTER — Ambulatory Visit: Payer: Medicare HMO | Admitting: Family Medicine

## 2023-05-20 ENCOUNTER — Ambulatory Visit: Admitting: Physical Therapy

## 2023-05-20 NOTE — Therapy (Signed)
 Pt reported to PT but had to leave d/t family emergency before session started.

## 2023-05-21 ENCOUNTER — Telehealth: Payer: Self-pay

## 2023-05-21 ENCOUNTER — Encounter: Payer: Self-pay | Admitting: Family Medicine

## 2023-05-21 ENCOUNTER — Ambulatory Visit: Attending: Family Medicine | Admitting: Family Medicine

## 2023-05-21 VITALS — BP 129/83 | HR 62 | Ht 63.0 in | Wt 195.8 lb

## 2023-05-21 DIAGNOSIS — Z7984 Long term (current) use of oral hypoglycemic drugs: Secondary | ICD-10-CM | POA: Diagnosis not present

## 2023-05-21 DIAGNOSIS — M7501 Adhesive capsulitis of right shoulder: Secondary | ICD-10-CM | POA: Diagnosis not present

## 2023-05-21 DIAGNOSIS — M7502 Adhesive capsulitis of left shoulder: Secondary | ICD-10-CM | POA: Diagnosis not present

## 2023-05-21 DIAGNOSIS — E1169 Type 2 diabetes mellitus with other specified complication: Secondary | ICD-10-CM | POA: Diagnosis not present

## 2023-05-21 DIAGNOSIS — R197 Diarrhea, unspecified: Secondary | ICD-10-CM | POA: Diagnosis not present

## 2023-05-21 MED ORDER — DAPAGLIFLOZIN PROPANEDIOL 5 MG PO TABS
5.0000 mg | ORAL_TABLET | Freq: Every day | ORAL | 1 refills | Status: DC
Start: 2023-05-21 — End: 2023-11-17

## 2023-05-21 MED ORDER — PREDNISONE 20 MG PO TABS: 20.0000 mg | ORAL_TABLET | Freq: Every day | ORAL | 0 refills | Status: DC

## 2023-05-21 NOTE — Progress Notes (Signed)
 Subjective:  Patient ID: Sydney Harris, female    DOB: June 24, 1965  Age: 58 y.o. MRN: [id number]  CC: Medical Management of Chronic Issues (Diarrhea from metformin /Bilateral shoulder pain)     Discussed the use of AI scribe software for clinical note transcription with the patient, who gave verbal consent to proceed.  History of Present Illness Sydney Harris is a 58 year old female type 2 diabetes mellitus, hypertension bipolar disorder, schizophrenia, previous substance (cocaine) abuse and chronic pain who presents with shoulder pain and issues related to metformin  use.  She experiences severe, persistent shoulder pain that disrupts sleep and daily activities. Physical therapy is difficult due to pain severity. She has not seen an orthopedic specialist which I had recommended at her last visit but she declined. Her shoulders are extremely stiff, and she cannot sleep or exercise. There is associated hand weakness. Percocet from her [facility] pain clinic is used for pain but is ineffective.  She has been taking metformin  for diabetes management, which is now causing intolerable diarrhea. She wishes to discontinue metformin  due to these side effects and concerns about liver and kidney health. Her diabetes is well controlled.  She has an appointment with a psychiatrist and is in contact with a pain management clinic. She is dissatisfied with the current clinic and I had referred her to headache pain management but she was informed there would be a long wait.    Past Medical History:  Diagnosis Date   Anxiety    Arthritis of left knee    Bipolar disorder (HCC)    Blood transfusion 1981   "related to my daughter being born"   Depression    Diabetes mellitus without complication (HCC)    Fibroid tumor    Gout    Hypertension    Plantar fasciitis    Substance abuse (HCC)    crack cocaine  14 yrs sober.    Past Surgical History:  Procedure Laterality Date   ABDOMINAL  HYSTERECTOMY     "for fibroid tumors"    Family History  Problem Relation Age of Onset   Diabetes Father    Asthma Daughter    Heart failure Neg Hx    Cancer Neg Hx    Colon polyps Neg Hx    Colon cancer Neg Hx    Esophageal cancer Neg Hx    Rectal cancer Neg Hx    Stomach cancer Neg Hx     Social History   Socioeconomic History   Marital status: Single    Spouse name: Not on file   Number of children: Not on file   Years of education: Not on file   Highest education level: 9th grade  Occupational History   Not on file  Tobacco Use   Smoking status: Former    Current packs/day: 0.00    Average packs/day: 0.5 packs/day for 33.0 years (16.5 ttl pk-yrs)    Types: Cigarettes    Start date: 47    Quit date: 2018    Years since quitting: 7.3   Smokeless tobacco: Never  Vaping Use   Vaping status: Never Used  Substance and Sexual Activity   Alcohol  use: No   Drug use: Not Currently    Types: "Crack" cocaine, Marijuana    Comment: clean since 2015; currently chews CBD gummies   Sexual activity: Yes    Partners: Male  Other Topics Concern   Not on file  Social History Narrative   Not on file  Social Drivers of Corporate investment banker Strain: Low Risk  (09/10/2022)   Overall Financial Resource Strain (CARDIA)    Difficulty of Paying Living Expenses: Not hard at all  Food Insecurity: Patient Declined (12/06/2022)   Hunger Vital Sign    Worried About Running Out of Food in the Last Year: Patient declined    Ran Out of Food in the Last Year: Patient declined  Transportation Needs: Patient Declined (12/06/2022)   PRAPARE - Administrator, Civil Service (Medical): Patient declined    Lack of Transportation (Non-Medical): Patient declined  Physical Activity: Inactive (09/10/2022)   Exercise Vital Sign    Days of Exercise per Week: 0 days    Minutes of Exercise per Session: 0 min  Stress: No Stress Concern Present (09/10/2022)   [employer] of  Occupational Health - Occupational Stress Questionnaire    Feeling of Stress : Not at all  Social Connections: Moderately Isolated (09/10/2022)   Social Connection and Isolation Panel [NHANES]    Frequency of Communication with Friends and Family: Twice a week    Frequency of Social Gatherings with Friends and Family: Three times a week    Attends Religious Services: More than 4 times per year    Active Member of Clubs or Organizations: No    Attends Banker Meetings: Never    Marital Status: Never married    Allergies  Allergen Reactions   Penicillins Hives    Has patient had a PCN reaction causing immediate rash, facial/tongue/throat swelling, SOB or lightheadedness with hypotension: Yes Has patient had a PCN reaction causing severe rash involving mucus membranes or skin necrosis: Yes Has patient had a PCN reaction that required hospitalization No Has patient had a PCN reaction occurring within the last 10 years: Yes If all of the above answers are "NO", then may proceed with Cephalosporin use.    Tomato     hives    Outpatient Medications Prior to Visit  Medication Sig Dispense Refill   ARIPiprazole  (ABILIFY ) 15 MG tablet Take 1 tablet (15 mg total) by mouth at bedtime. 30 tablet 4   atorvastatin  (LIPITOR) 10 MG tablet Take 1 tablet by mouth once daily 90 tablet 1   baclofen  (LIORESAL ) 10 MG tablet Take 1 tablet (10 mg total) by mouth 3 (three) times daily. 270 tablet 1   benztropine  (COGENTIN ) 0.5 MG tablet Take 1 tablet (0.5 mg total) by mouth at bedtime. 30 tablet 4   Blood Glucose Monitoring Suppl (GNP TRUE METRIX AIR METER) w/Device KIT Use to test blood sugar 3 times a day 1 kit 0   chlorpheniramine-HYDROcodone  (TUSSIONEX) 10-8 MG/5ML Take 5 mLs by mouth every 12 (twelve) hours as needed for cough. 70 mL 0   doxycycline  (VIBRAMYCIN ) 100 MG capsule Take 1 capsule (100 mg total) by mouth 2 (two) times daily. One po bid x 7 days 14 capsule 0   escitalopram   (LEXAPRO ) 5 MG tablet Take 1 tablet (5 mg total) by mouth daily. 30 tablet 4   gabapentin  (NEURONTIN ) 300 MG capsule Take 1 capsule (300 mg total) by mouth 3 (three) times daily. 90 capsule 1   glucose blood (GNP TRUE METRIX GLUCOSE STRIPS) test strip Use as instructed 100 each 12   latanoprost (XALATAN) 0.005 % ophthalmic solution Place 1 drop into both eyes at bedtime.     losartan -hydrochlorothiazide  (HYZAAR ) 50-12.5 MG tablet Take 1 tablet by mouth daily. 90 tablet 1   meloxicam  (MOBIC ) 7.5 MG tablet Take  1 tablet (7.5 mg total) by mouth daily. 90 tablet 1   Misc. Devices [employer] Dagnosis- left knee pain 1 each 0   Multiple Vitamin (MULTIVITAMIN ADULT PO) Take by mouth daily.     Multiple Vitamins-Minerals (HAIR SKIN AND NAILS FORMULA PO) Take by mouth daily.     naloxone (NARCAN) nasal spray 4 mg/0.1 mL SMARTSIG:Both Nares     omeprazole  (PRILOSEC) 40 MG capsule Take 1 capsule by mouth once daily 90 capsule 0   traZODone  (DESYREL ) 100 MG tablet TAKE 1 & 1/2 (ONE & ONE-HALF) TABLETS BY MOUTH AT BEDTIME AS NEEDED 45 tablet 1   TRUEplus Lancets 33G MISC Use to test blood sugar 3 times a day 100 each 12   Vitamin D , Ergocalciferol , (DRISDOL ) 1.25 MG (50000 UNIT) CAPS capsule Take 1 capsule (50,000 Units total) by mouth once a week. 12 capsule 4   metFORMIN  (GLUCOPHAGE ) 500 MG tablet Take 1 tablet (500 mg total) by mouth daily with breakfast. 90 tablet 1   predniSONE  (DELTASONE ) 20 MG tablet 2 tabs po daily x 3 days 6 tablet 0   No facility-administered medications prior to visit.     ROS Review of Systems  Constitutional:  Negative for activity change and appetite change.  HENT:  Negative for sinus pressure and sore throat.   Respiratory:  Negative for chest tightness, shortness of breath and wheezing.   Cardiovascular:  Negative for chest pain and palpitations.  Gastrointestinal:  Positive for diarrhea. Negative for abdominal distention, abdominal pain and constipation.   Genitourinary: Negative.   Musculoskeletal:        See HPI  Psychiatric/Behavioral:  Negative for behavioral problems and dysphoric mood.     Objective:  BP 129/83   Pulse 62   Ht 5\' 3"  (1.6 m)   Wt 195 lb 12.8 oz (88.8 kg)   LMP 12/28/2010   SpO2 97%   BMI 34.68 kg/m      05/21/2023    9:29 AM 05/05/2023    3:20 PM 05/05/2023    3:19 PM  BP/Weight  Systolic BP 129 124   Diastolic BP 83 85   Wt. (Lbs) 195.8  197.8  BMI 34.68 kg/m2  35.04 kg/m2      Physical Exam Constitutional: normal appearing,  Cardiovascular: normal rate and rhythm, normal heart sounds, no murmurs, rub or gallop, no pedal edema Respiratory: Normal breath sounds, clear to auscultation bilaterally, no wheezes, no rales, no rhonchi Abdomen: soft, not tender to palpation, normal bowel sounds, no enlarged organs Musculoskeletal: Restriction in abduction of both upper extremities to roughly 60 degrees.  Reduced hand grip bilaterally Skin: warm and dry, no lesions. Neurological: alert, oriented x3, cranial nerves I-XII grossly intact , normal motor strength, normal sensation. Psychological: normal mood.     Latest Ref Rng & Units 05/05/2023    4:20 PM 04/13/2023    6:19 PM 11/19/2022    9:12 AM  CMP  Glucose 70 - 99 mg/dL  92  161   BUN 6 - 20 mg/dL  21  16   Creatinine 0.96 - 1.00 mg/dL  0.45  4.09   Sodium 811 - 145 mmol/L  139  144   Potassium 3.5 - 5.2 mmol/L 4.2  3.3  4.6   Chloride 98 - 111 mmol/L  104  104   CO2 22 - 32 mmol/L  23  24   Calcium  8.9 - 10.3 mg/dL  91.4  78.2   Total Protein 6.5 - 8.1 g/dL  8.4  Total Bilirubin 0.0 - 1.2 mg/dL  0.6    Alkaline Phos 38 - 126 U/L  60    AST 15 - 41 U/L  21    ALT 0 - 44 U/L  18      Lipid Panel     Component Value Date/Time   CHOL 189 07/03/2022 1707   TRIG 86 07/03/2022 1707   HDL 74 07/03/2022 1707   CHOLHDL 2.6 07/03/2022 1707   LDLCALC 100 (H) 07/03/2022 1707    CBC    Component Value Date/Time   WBC 13.4 (H) 04/13/2023 1819    RBC 5.21 (H) 04/13/2023 1819   HGB 14.6 04/13/2023 1819   HGB 13.4 10/02/2021 1042   HCT 46.3 (H) 04/13/2023 1819   HCT 40.5 10/02/2021 1042   PLT 325 04/13/2023 1819   PLT 426 10/02/2021 1042   MCV 88.9 04/13/2023 1819   MCV 87 10/02/2021 1042   MCH 28.0 04/13/2023 1819   MCHC 31.5 04/13/2023 1819   RDW 14.0 04/13/2023 1819   RDW 12.3 10/02/2021 1042   LYMPHSABS 1.9 04/13/2023 1819   LYMPHSABS 3.7 (H) 10/02/2021 1042   MONOABS 0.8 04/13/2023 1819   EOSABS 0.0 04/13/2023 1819   EOSABS 0.2 10/02/2021 1042   BASOSABS 0.1 04/13/2023 1819   BASOSABS 0.1 10/02/2021 1042    Lab Results  Component Value Date   HGBA1C 6.2 05/05/2023       Assessment & Plan Bilateral adhesive capsulitis Chronic severe pain and stiffness affecting sleep and daily activities. Previous physical therapy ineffective. Current pain management with Percocet inadequate. Corticosteroid injections recommended. - Refer to orthopedics for corticosteroid injection to relieve pain and improve mobility. - Prescribe prednisone  20 mg orally once daily for 5 days to manage inflammation until orthopedic intervention. - Advise prednisone  may increase appetite; monitor food intake. - Instruct to contact [facility] for waiting list placement.  Type 2 diabetes mellitus A1c controlled at 6.2 Diarrhea from metformin  use causing significant distress. Blood sugar well controlled but needs alternative to avoid increased levels. Farxiga  chosen for efficacy without gastrointestinal side effects. - Discontinue metformin  due to gastrointestinal side effects. - Initiate Farxiga  5 mg orally once daily to maintain blood sugar control. - Send prescription for Farxiga  to [facility].      Meds ordered this encounter  Medications   predniSONE  (DELTASONE ) 20 MG tablet    Sig: Take 1 tablet (20 mg total) by mouth daily with breakfast.    Dispense:  5 tablet    Refill:  0   dapagliflozin   propanediol (FARXIGA ) 5 MG TABS tablet    Sig: Take 1 tablet (5 mg total) by mouth daily before breakfast.    Dispense:  90 tablet    Refill:  1    Discontinue Metformin     Follow-up: Return for previously scheduled appointment.       Joaquin Mulberry, MD, FAAFP. [facility] [place], [place] [phone]   05/21/2023, 10:51 AM

## 2023-05-21 NOTE — Telephone Encounter (Signed)
 From her referral note: Type Date User Summary Attachment  Provider Comments 11/19/2022 11:05 AM [facility]  Referral -  Note: Sent Referral to [facility] Ph. # [phone] / [phone] .They will contact the patient to schedule an appointment.

## 2023-05-21 NOTE — Telephone Encounter (Signed)
 Can you re open her pain management referral.

## 2023-05-21 NOTE — Telephone Encounter (Signed)
 Patient is needing a new referral placed.   Copied from CRM [id number]. Topic: Referral - Question >> May 21, 2023 10:27 AM Sydney Harris S wrote: Reason for CRM: Patient is requesting to have her referral sent to [facility]- Patient states she was contatcted by them and advised the PCP hasn't sent the referral request over yet

## 2023-05-21 NOTE — Patient Instructions (Signed)
 Sent Referral to [facility] Ph. # [phone] / [phone] .They will contact the patient to schedule an appointment.

## 2023-05-22 NOTE — Telephone Encounter (Signed)
 Yes please reopen the referral

## 2023-05-26 ENCOUNTER — Ambulatory Visit: Admitting: Physical Therapy

## 2023-05-26 NOTE — Telephone Encounter (Signed)
Patient was called and informed that referral has been placed.

## 2023-05-27 ENCOUNTER — Encounter: Admitting: Orthopaedic Surgery

## 2023-06-02 ENCOUNTER — Encounter: Admitting: Physical Therapy

## 2023-06-03 DIAGNOSIS — E1169 Type 2 diabetes mellitus with other specified complication: Secondary | ICD-10-CM | POA: Diagnosis not present

## 2023-06-03 DIAGNOSIS — M5134 Other intervertebral disc degeneration, thoracic region: Secondary | ICD-10-CM | POA: Diagnosis not present

## 2023-06-03 DIAGNOSIS — E7800 Pure hypercholesterolemia, unspecified: Secondary | ICD-10-CM | POA: Diagnosis not present

## 2023-06-03 DIAGNOSIS — Z9181 History of falling: Secondary | ICD-10-CM | POA: Diagnosis not present

## 2023-06-03 DIAGNOSIS — M1712 Unilateral primary osteoarthritis, left knee: Secondary | ICD-10-CM | POA: Diagnosis not present

## 2023-06-03 DIAGNOSIS — Z79899 Other long term (current) drug therapy: Secondary | ICD-10-CM | POA: Diagnosis not present

## 2023-06-04 ENCOUNTER — Other Ambulatory Visit (INDEPENDENT_AMBULATORY_CARE_PROVIDER_SITE_OTHER): Payer: Self-pay

## 2023-06-04 ENCOUNTER — Encounter: Payer: Self-pay | Admitting: Physician Assistant

## 2023-06-04 ENCOUNTER — Ambulatory Visit: Admitting: Physician Assistant

## 2023-06-04 DIAGNOSIS — M25512 Pain in left shoulder: Secondary | ICD-10-CM

## 2023-06-04 DIAGNOSIS — M25511 Pain in right shoulder: Secondary | ICD-10-CM

## 2023-06-04 DIAGNOSIS — G8929 Other chronic pain: Secondary | ICD-10-CM | POA: Diagnosis not present

## 2023-06-04 DIAGNOSIS — M7501 Adhesive capsulitis of right shoulder: Secondary | ICD-10-CM

## 2023-06-04 NOTE — Progress Notes (Signed)
 Another lady that  Office Visit Note   Patient: Sydney Harris           Date of Birth: 11-Oct-1965           MRN: [id number] Visit Date: 06/04/2023              Requested by: Joaquin Mulberry, MD [address] [place],  [place] [address] PCP: Joaquin Mulberry, MD   Assessment & Plan: Visit Diagnoses:  1. Chronic right shoulder pain   2. Chronic left shoulder pain     Plan: Patient is a 58 year old woman who was referred for adhesive capsulitis.  Right worse than left.  She denies any history of injury has been going on for about a month she does have a history of diabetes.  She was prescribed physical therapy she has worked with the physical therapist twice.  She is on chronic pain management.  She has very little motion is very difficult to examine.  She is neurovascular intact do not expect suspect any kind of infective type process.  Could not test strength because of her pain.  Discussed trying an injection into her right shoulder she consented to this today.  She may have her left shoulder injected if desired in 2 weeks stressed the importance of continuing physical therapy  Follow-Up Instructions: Return in about 2 weeks (around 06/18/2023).   Orders:  Orders Placed This Encounter  Procedures   XR Shoulder Right   XR Shoulder Left   No orders of the defined types were placed in this encounter.     Procedures: No procedures performed   Clinical Data: No additional findings.   Subjective: No chief complaint on file.   HPI pleasant 58 year old woman presents with right greater than left shoulder pain been going on for about a month no particular injury.  Has very limited motion secondary to the pain.  Was prescribed physical therapy has been to 2 sessions  Review of Systems  All other systems reviewed and are negative.    Objective: Vital Signs: LMP 12/28/2010   Physical Exam Constitutional:      Appearance: Normal appearance.  Pulmonary:      Effort: Pulmonary effort is normal.  Skin:    General: Skin is warm and dry.  Neurological:     General: No focal deficit present.     Mental Status: She is alert and oriented to person, place, and time.  Psychiatric:        Mood and Affect: Mood normal.        Behavior: Behavior normal.     Ortho Exam Examination of her bilateral shoulders very difficult exam I can she can actively extend to about 100 degrees.  Resist me bringing her back anymore.  Will not do any internal rotation behind her back.  Is unable to do a empty can test.  No redness nothing infected.  Exam very similar bilaterally the right may be slightly worse than left Specialty Comments:  No specialty comments available.  Imaging: XR Shoulder Left Result Date: 06/04/2023 Radiographs of the left shoulder she does have some cystic changes in the humeral head but otherwise fairly well-preserved joint spaces little degeneration in the [facility] joint  XR Shoulder Right Result Date: 06/04/2023 Radiographs of her right shoulder no acute fracture she does have some degeneration of the [facility] joint.  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   Bipolar 1 disorder (HCC) 10/02/2021   Generalized anxiety disorder 10/02/2021   HTN (hypertension) 03/28/2020   Chronic bilateral low back pain without sciatica 12/28/2019   Hyperlipidemia associated with type 2 diabetes mellitus (HCC) 12/22/2019   Chronic pain of left knee 12/21/2019   Type 2 diabetes mellitus with hyperglycemia, without long-term current use of insulin (HCC) 12/21/2019   Past Medical History:  Diagnosis Date   Anxiety    Arthritis of left knee    Bipolar disorder (HCC)    Blood transfusion 1981   "related to my daughter being born"   Depression    Diabetes mellitus without complication (HCC)    Fibroid  tumor    Gout    Hypertension    Plantar fasciitis    Substance abuse (HCC)    crack cocaine  14 yrs sober.    Family History  Problem Relation Age of Onset   Diabetes Father    Asthma Daughter    Heart failure Neg Hx    Cancer Neg Hx    Colon polyps Neg Hx    Colon cancer Neg Hx    Esophageal cancer Neg Hx    Rectal cancer Neg Hx    Stomach cancer Neg Hx     Past Surgical History:  Procedure Laterality Date   ABDOMINAL HYSTERECTOMY     "for fibroid tumors"   Social History   Occupational History   Not on file  Tobacco Use   Smoking status: Former    Current packs/day: 0.00    Average packs/day: 0.5 packs/day for 33.0 years (16.5 ttl pk-yrs)    Types: Cigarettes    Start date: 40    Quit date: 2018    Years since quitting: 7.3   Smokeless tobacco: Never  Vaping Use   Vaping status: Never Used  Substance and Sexual Activity   Alcohol  use: No   Drug use: Not Currently    Types: "Crack" cocaine, Marijuana    Comment: clean since 2015; currently chews CBD gummies   Sexual activity: Yes    Partners: Male

## 2023-06-08 DIAGNOSIS — Z79899 Other long term (current) drug therapy: Secondary | ICD-10-CM | POA: Diagnosis not present

## 2023-06-09 ENCOUNTER — Encounter: Admitting: Physical Therapy

## 2023-06-22 ENCOUNTER — Other Ambulatory Visit: Payer: Self-pay | Admitting: Critical Care Medicine

## 2023-07-03 DIAGNOSIS — Z9181 History of falling: Secondary | ICD-10-CM | POA: Diagnosis not present

## 2023-07-03 DIAGNOSIS — G8929 Other chronic pain: Secondary | ICD-10-CM | POA: Diagnosis not present

## 2023-07-03 DIAGNOSIS — M1712 Unilateral primary osteoarthritis, left knee: Secondary | ICD-10-CM | POA: Diagnosis not present

## 2023-07-03 DIAGNOSIS — E7800 Pure hypercholesterolemia, unspecified: Secondary | ICD-10-CM | POA: Diagnosis not present

## 2023-07-03 DIAGNOSIS — E1169 Type 2 diabetes mellitus with other specified complication: Secondary | ICD-10-CM | POA: Diagnosis not present

## 2023-07-03 DIAGNOSIS — M5134 Other intervertebral disc degeneration, thoracic region: Secondary | ICD-10-CM | POA: Diagnosis not present

## 2023-07-03 DIAGNOSIS — M25511 Pain in right shoulder: Secondary | ICD-10-CM | POA: Diagnosis not present

## 2023-07-03 DIAGNOSIS — Z79899 Other long term (current) drug therapy: Secondary | ICD-10-CM | POA: Diagnosis not present

## 2023-07-08 DIAGNOSIS — Z79899 Other long term (current) drug therapy: Secondary | ICD-10-CM | POA: Diagnosis not present
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
## 2023-07-28 ENCOUNTER — Other Ambulatory Visit: Payer: Self-pay

## 2023-07-28 ENCOUNTER — Ambulatory Visit (HOSPITAL_BASED_OUTPATIENT_CLINIC_OR_DEPARTMENT_OTHER): Payer: Self-pay | Admitting: Psychiatry

## 2023-07-28 ENCOUNTER — Encounter (HOSPITAL_COMMUNITY): Payer: Self-pay | Admitting: Psychiatry

## 2023-07-28 VITALS — BP 120/80 | HR 65 | Ht 63.0 in | Wt 205.0 lb

## 2023-07-28 DIAGNOSIS — F411 Generalized anxiety disorder: Secondary | ICD-10-CM

## 2023-07-28 DIAGNOSIS — F1290 Cannabis use, unspecified, uncomplicated: Secondary | ICD-10-CM | POA: Diagnosis not present

## 2023-07-28 DIAGNOSIS — F319 Bipolar disorder, unspecified: Secondary | ICD-10-CM

## 2023-07-28 MED ORDER — ARIPIPRAZOLE 15 MG PO TABS: 15.0000 mg | ORAL_TABLET | Freq: Every day | ORAL | 2 refills | Status: DC

## 2023-07-28 MED ORDER — TRAZODONE HCL 100 MG PO TABS: 100.0000 mg | ORAL_TABLET | Freq: Every day | ORAL | 2 refills | Status: DC

## 2023-07-28 MED ORDER — ESCITALOPRAM OXALATE 5 MG PO TABS: 5.0000 mg | ORAL_TABLET | Freq: Every day | ORAL | 2 refills | Status: DC

## 2023-08-03 ENCOUNTER — Ambulatory Visit: Payer: Self-pay

## 2023-08-03 NOTE — Telephone Encounter (Signed)
 FYI Only or Action Required?: Action required by provider: request for appointment.  Patient was last seen in primary care on 05/21/2023 by Newlin, Enobong, MD.  Called Nurse Triage reporting Arm Swelling.  Symptoms began several weeks ago.  Interventions attempted: Nothing.  Symptoms are: unchanged.  Triage Disposition: See Physician Within 24 Hours  Patient/caregiver understands and will follow disposition?: No, refuses disposition      Copied from CRM [id number]. Topic: Clinical - Red Word Triage >> Aug 03, 2023  8:46 AM Sydney Harris wrote: Red Word that prompted transfer to Nurse Triage: shoulder pain and arm is swollen. Reason for Disposition  MODERATE arm swelling (e.g., puffiness or swollen feeling of entire arm)  Answer Assessment - Initial Assessment Questions 1. ONSET: When did the swelling start? (e.g., minutes, hours, days)     X 2 month and worsening 2. LOCATION: What part of the arm is swollen?  Are both arms swollen or just one arm?     Bilateral arm pain and swelling 3. SEVERITY: How bad is the swelling? (e.g., localized; mild, moderate, severe)     Moderate  4. REDNESS: Is there redness or signs of infection?     unknown 5. PAIN: Is the swelling painful to touch? If Yes, ask: How painful is it?   (Scale 1-10; mild, moderate or severe)     10/10 6. FEVER: Do you have a fever? If Yes, ask: What is it, how was it measured, and when did it start?      yes 7. CAUSE: What do you think is causing the arm swelling?     unknown 8. MEDICAL HISTORY: Do you have a history of heart failure, kidney disease, liver failure, or cancer?     na 9. RECURRENT SYMPTOM: Have you had arm swelling before? If Yes, ask: When was the last time? What happened that time?     na 10. OTHER SYMPTOMS: Do you have any other symptoms? (e.g., chest pain, difficulty breathing)       SOB at times w/exertion 11. PREGNANCY: Is there any chance you are pregnant? When  was your last menstrual period?       Na  Notes: offered to schedule pt appt for tomorrow with her PCP: pt became very upset talking loud and harsh stating I need to see my PCP today that's why I called.  Attempted to explain to pt no openings: offered pt appt with another PCP: pt refused - pt stated she called yesterday and was giving the run around about speaking with her PCP & she has not gotten a call back from the PCP et.  Request info be sent to PCP today to please call pt & pt would like to be worked into schedule today,  Protocols used: Arm Swelling and Edema-A-AH

## 2023-08-03 NOTE — Telephone Encounter (Signed)
 Patient scheduled with PCP with next available appointment.

## 2023-08-07 DIAGNOSIS — M25511 Pain in right shoulder: Secondary | ICD-10-CM | POA: Diagnosis not present

## 2023-08-07 DIAGNOSIS — E1169 Type 2 diabetes mellitus with other specified complication: Secondary | ICD-10-CM | POA: Diagnosis not present

## 2023-08-07 DIAGNOSIS — Z79899 Other long term (current) drug therapy: Secondary | ICD-10-CM | POA: Diagnosis not present

## 2023-08-07 DIAGNOSIS — Z9181 History of falling: Secondary | ICD-10-CM | POA: Diagnosis not present

## 2023-08-07 DIAGNOSIS — E7800 Pure hypercholesterolemia, unspecified: Secondary | ICD-10-CM | POA: Diagnosis not present

## 2023-08-07 DIAGNOSIS — M1712 Unilateral primary osteoarthritis, left knee: Secondary | ICD-10-CM | POA: Diagnosis not present

## 2023-08-07 DIAGNOSIS — M5134 Other intervertebral disc degeneration, thoracic region: Secondary | ICD-10-CM | POA: Diagnosis not present

## 2023-08-07 DIAGNOSIS — G8929 Other chronic pain: Secondary | ICD-10-CM | POA: Diagnosis not present

## 2023-08-10 ENCOUNTER — Ambulatory Visit (HOSPITAL_COMMUNITY)
Admission: EM | Admit: 2023-08-10 | Discharge: 2023-08-10 | Disposition: A | Discharge status: Home / Self Care | Attending: Family Medicine | Admitting: Family Medicine

## 2023-08-10 ENCOUNTER — Encounter (HOSPITAL_COMMUNITY): Payer: Self-pay

## 2023-08-10 DIAGNOSIS — G8929 Other chronic pain: Secondary | ICD-10-CM

## 2023-08-10 DIAGNOSIS — M25512 Pain in left shoulder: Secondary | ICD-10-CM

## 2023-08-10 DIAGNOSIS — M25511 Pain in right shoulder: Secondary | ICD-10-CM

## 2023-08-10 MED ORDER — PREDNISONE 20 MG PO TABS
ORAL_TABLET | ORAL | 0 refills | Status: DC
Start: 2023-08-10 — End: 2023-11-18

## 2023-08-10 NOTE — ED Provider Notes (Signed)
 [facility]    CSN: [id number] Arrival date & time: 08/10/23  0803      History   Chief Complaint No chief complaint on file.   HPI Sydney Harris is a 58 y.o. female.   HPI  Here for pain in both shoulders.  She has seen orthopedics and she relates that she has not up already having steroid injections.  The pain is still bothering her a lot and Mobic  is not helping.  She is already on oxycodone  4 times a day and filled it about 3 days ago.  She is allergic to penicillin   She does have a history of diabetes but her sugars have been good Past Medical History:  Diagnosis Date   Anxiety    Arthritis of left knee    Bipolar disorder (HCC)    Blood transfusion 1981   related to my daughter being born   Depression    Diabetes mellitus without complication (HCC)    Fibroid tumor    Gout    Hypertension    Plantar fasciitis    Substance abuse (HCC)    crack cocaine  14 yrs sober.    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      Home Medications    Prior to Admission medications   Medication Sig Start Date End Date Taking? Authorizing Provider  ARIPiprazole  (ABILIFY ) 15 MG tablet Take 1 tablet (15 mg total) by mouth at bedtime. 07/28/23  Yes Carvin Arvella HERO, MD  atorvastatin  (LIPITOR) 10 MG tablet Take 1 tablet by mouth once daily 06/22/23  Yes Newlin, Enobong, MD  benztropine  (COGENTIN ) 0.5 MG tablet Take 1 tablet (0.5 mg total) by mouth at bedtime. 07/03/22  Yes Brien Belvie BRAVO, MD  Blood Glucose Monitoring Suppl (GNP TRUE METRIX AIR METER) w/Device KIT Use to test blood sugar 3 times a day 09/30/22  Yes Brien Belvie BRAVO, MD  dapagliflozin  propanediol (FARXIGA ) 5 MG TABS tablet Take 1 tablet (5 mg total) by mouth daily before breakfast. 05/21/23  Yes Newlin, Enobong, MD  escitalopram  (LEXAPRO ) 5 MG tablet Take 1 tablet (5 mg total) by mouth daily. 07/28/23  Yes Carvin Arvella HERO, MD  gabapentin  (NEURONTIN ) 300 MG capsule Take 1 capsule (300 mg total) by mouth 3 (three) times daily. 05/05/23  Yes Newlin, Enobong, MD  glucose blood (GNP TRUE METRIX GLUCOSE STRIPS) test strip Use as instructed 09/30/22  Yes Brien Belvie BRAVO, MD  latanoprost (XALATAN) 0.005 % ophthalmic solution Place 1 drop into both eyes at bedtime. 01/29/22  Yes [provider]  losartan -hydrochlorothiazide  (HYZAAR ) 50-12.5 MG tablet Take 1 tablet by mouth daily. 05/05/23  Yes Newlin, Enobong, MD  metFORMIN  (GLUCOPHAGE ) 500 MG tablet Take 500 mg by mouth every morning. 06/25/23  Yes [provider]  Misc.  Devices [employer] Dagnosis- left knee pain 11/19/22  Yes Newlin, Enobong, MD  Multiple Vitamin (MULTIVITAMIN ADULT PO) Take by mouth daily.   Yes [provider]  Multiple Vitamins-Minerals (HAIR SKIN AND NAILS FORMULA PO) Take by mouth daily.   Yes [provider]  oxyCODONE -acetaminophen  (PERCOCET) 10-325 MG tablet Take 1 tablet by mouth 4 (four) times daily. 07/08/23  Yes [provider]  predniSONE  (DELTASONE ) 20 MG tablet 3 tabs daily x3 days, then 2 tabs daily x3 days, then 1 tab daily x3 days, then one half tab daily x3 days, then stop 08/10/23  Yes Sherrill Mckamie K, MD  traZODone  (DESYREL ) 100 MG tablet Take 1 tablet (100 mg total) by  mouth at bedtime. 07/28/23  Yes Carvin Arvella HERO, MD  TRUEplus Lancets 33G MISC Use to test blood sugar 3 times a day 09/30/22  Yes Brien Belvie BRAVO, MD  Vitamin D , Ergocalciferol , (DRISDOL ) 1.25 MG (50000 UNIT) CAPS capsule Take 1 capsule (50,000 Units total) by mouth once a week. 09/30/22  Yes Brien Belvie BRAVO, MD  baclofen  (LIORESAL ) 10 MG tablet Take 1 tablet (10 mg total) by mouth 3 (three) times daily. 05/05/23   Newlin, Enobong, MD  naloxone [facility]) nasal spray 4 mg/0.1 mL SMARTSIG:Both Nares Patient not taking: No sig reported 09/21/20   [provider]  timolol (TIMOPTIC) 0.5 % ophthalmic solution 1 drop every morning. 06/23/23   [provider]    Family History Family History  Problem Relation Age of Onset   Diabetes Father    Asthma Daughter    Heart failure Neg Hx    Cancer Neg Hx    Colon polyps Neg Hx    Colon cancer Neg Hx    Esophageal cancer Neg Hx    Rectal cancer Neg Hx    Stomach cancer Neg Hx     Social History Social History   Tobacco Use   Smoking status: Former    Current packs/day: 0.00    Average packs/day: 0.5 packs/day for 33.0 years (16.5 ttl pk-yrs)    Types: Cigarettes    Start date: 6    Quit date: 2018    Years since quitting: 7.5   Smokeless tobacco: Never  Vaping Use   Vaping status: Never Used  Substance Use Topics   Alcohol  use: No   Drug use: Not Currently    Types: Crack cocaine, Marijuana    Comment: clean since 2015; currently chews CBD gummies     Allergies   Penicillins and Tomato   Review of Systems Review of Systems   Physical Exam Triage Vital Signs ED Triage Vitals  Encounter Vitals Group     BP 08/10/23 0822 111/71     Girls Systolic BP Percentile --      Girls Diastolic BP Percentile --      Boys Systolic BP Percentile --      Boys Diastolic BP Percentile --      Pulse Rate 08/10/23 0822 73     Resp 08/10/23 0822 20     Temp 08/10/23 0822 98.3 F (36.8 C)     Temp Source 08/10/23 0822  Oral     SpO2 08/10/23 0822 98 %     Weight --      Height --      Head Circumference --      Peak Flow --      Pain Score 08/10/23 0826 8     Pain Loc --  Pain Education --      Exclude from Growth Chart --    No data found.  Updated Vital Signs BP 111/71 (BP Location: Left Arm)   Pulse 73   Temp 98.3 F (36.8 C) (Oral)   Resp 20   LMP 12/28/2010   SpO2 98%   Visual Acuity Right Eye Distance:   Left Eye Distance:   Bilateral Distance:    Right Eye Near:   Left Eye Near:    Bilateral Near:     Physical Exam Vitals reviewed.  Constitutional:      General: She is not in acute distress.    Appearance: She is not ill-appearing, toxic-appearing or diaphoretic.  Cardiovascular:     Rate and Rhythm: Normal rate and regular rhythm.  Pulmonary:     Effort: Pulmonary effort is normal.     Breath sounds: Normal breath sounds.  Skin:    Coloration: Skin is not jaundiced or pale.  Neurological:     General: No focal deficit present.     Mental Status: She is alert and oriented to person, place, and time.  Psychiatric:        Behavior: Behavior normal.      UC Treatments / Results  Labs (all labs ordered are listed, but only abnormal results are displayed) Labs Reviewed - No data to display  EKG   Radiology No results found.  Procedures Procedures (including critical care time)  Medications Ordered in UC Medications - No data to display  Initial Impression / Assessment and Plan / UC Course  I have reviewed the triage vital signs and the nursing notes.  Pertinent labs & imaging results that were available during my care of the patient were reviewed by me and considered in my medical decision making (see chart for details).     She will stop taking the Mobic .  A longer taper of prednisone  is sent and to taper over 12 days.  She will follow-up with her primary care and with orthopedics   Final Clinical Impressions(s) / UC Diagnoses   Final  diagnoses:  Chronic pain of both shoulders     Discharge Instructions      Take prednisone  20 mg--3 tabs daily x3 days, then 2 tabs daily x3 days, then 1 tab daily x3 days, then one half tab daily x3 days, then stop  Stop taking meloxicam   The prednisone  could make your sugars go higher.  Follow-up with your primary care and your orthopedic provider.    ED Prescriptions     Medication Sig Dispense Auth. Provider   predniSONE  (DELTASONE ) 20 MG tablet 3 tabs daily x3 days, then 2 tabs daily x3 days, then 1 tab daily x3 days, then one half tab daily x3 days, then stop 20 tablet Li Fragoso K, MD      I have reviewed the PDMP during this encounter.   Vonna Sharlet POUR, MD 08/10/23 1900

## 2023-08-10 NOTE — ED Triage Notes (Signed)
 Patient reports bilateral arm and shoulder pain x 2 months. No known injuries. Patient states Mobic  is not working.

## 2023-08-10 NOTE — Discharge Instructions (Signed)
 Take prednisone  20 mg--3 tabs daily x3 days, then 2 tabs daily x3 days, then 1 tab daily x3 days, then one half tab daily x3 days, then stop  Stop taking meloxicam   The prednisone  could make your sugars go higher.  Follow-up with your primary care and your orthopedic provider.

## 2023-08-12 DIAGNOSIS — Z79899 Other long term (current) drug therapy: Secondary | ICD-10-CM | POA: Diagnosis not present

## 2023-08-14 ENCOUNTER — Telehealth: Payer: Self-pay | Admitting: Family Medicine

## 2023-08-14 NOTE — Telephone Encounter (Signed)
 Called patient to confirm upcoming appointment 08/18/2023 2:10 pm. Patient appointment has been successfully confirmed.

## 2023-08-18 ENCOUNTER — Ambulatory Visit: Attending: Family Medicine | Admitting: Family Medicine

## 2023-08-18 ENCOUNTER — Encounter: Payer: Self-pay | Admitting: Family Medicine

## 2023-08-18 VITALS — BP 103/67 | HR 63 | Ht 63.0 in | Wt 201.6 lb

## 2023-08-18 DIAGNOSIS — E1140 Type 2 diabetes mellitus with diabetic neuropathy, unspecified: Secondary | ICD-10-CM | POA: Diagnosis not present

## 2023-08-18 DIAGNOSIS — M7502 Adhesive capsulitis of left shoulder: Secondary | ICD-10-CM

## 2023-08-18 DIAGNOSIS — G8929 Other chronic pain: Secondary | ICD-10-CM

## 2023-08-18 DIAGNOSIS — M7501 Adhesive capsulitis of right shoulder: Secondary | ICD-10-CM | POA: Diagnosis not present

## 2023-08-18 DIAGNOSIS — Z7984 Long term (current) use of oral hypoglycemic drugs: Secondary | ICD-10-CM

## 2023-08-18 NOTE — Patient Instructions (Signed)
 VISIT SUMMARY:  Today, you were seen for persistent shoulder pain and limited motion due to adhesive capsulitis, as well as numbness in your left index finger related to diabetic neuropathy. We discussed your current treatment and future steps to manage your conditions.  YOUR PLAN:  -BILATERAL ADHESIVE CAPSULITIS OF SHOULDERS: Adhesive capsulitis, also known as frozen shoulder, is a condition characterized by stiffness and pain in your shoulder joints. Despite previous treatments, you continue to experience significant pain and limited motion. We will refer you to an orthopedic specialist for further evaluation and management. If conservative treatments do not help, you may need to discuss surgical options with the orthopedist.  -TYPE 2 DIABETES MELLITUS WITH DIABETIC NEUROPATHY OF LEFT INDEX FINGER: Type 2 diabetes is a chronic condition that affects the way your body processes blood sugar. Diabetic neuropathy is a type of nerve damage that can occur with diabetes, causing numbness in areas like your left index finger. Your diabetes is well controlled, and you should continue taking gabapentin  to manage the neuropathy.  INSTRUCTIONS:  Please follow up with the orthopedic specialist as soon as possible for further evaluation of your shoulder pain. Continue taking gabapentin  for your diabetic neuropathy and monitor your blood sugar levels regularly. If you have any new symptoms or concerns, please contact our office.

## 2023-08-18 NOTE — Progress Notes (Signed)
 Subjective:  Patient ID: Sydney Harris, female    DOB: [birth year]  Age: 58 y.o. MRN: [id number]  CC: Shoulder Pain (Bilateral shoulder pain)     Discussed the use of AI scribe software for clinical note transcription with the patient, who gave verbal consent to proceed.  History of Present Illness Sydney Harris is a 58 year old female with  type 2 diabetes mellitus, hypertension bipolar disorder, schizophrenia, previous substance (cocaine) abuse chronic pain, adhesive capsulitis who presents with persistent shoulder pain.  She experiences persistent bilateral shoulder pain with very limited motion. A corticosteroid injection in the right shoulder initially provided relief, but the pain returned. Prednisone  prescribed by urgent care 1 week ago has not significantly alleviated her symptoms. The pain affects her daily activities, including dressing, which requires assistance from her husband. She is under chronic pain management and is currently taking oxycodone  for pain relief, but continues to experience significant discomfort. Additionally, she has numbness in her left index finger, she is already on  gabapentin .    Past Medical History:  Diagnosis Date   Anxiety    Arthritis of left knee    Bipolar disorder (HCC)    Blood transfusion 1981   related to my daughter being born   Depression    Diabetes mellitus without complication (HCC)    Fibroid tumor    Gout    Hypertension    Plantar fasciitis    Substance abuse (HCC)    crack cocaine  14 yrs sober.    Past Surgical History:  Procedure Laterality Date   ABDOMINAL HYSTERECTOMY     for fibroid tumors    Family History  Problem Relation Age of Onset   Diabetes Father    Asthma Daughter    Heart failure Neg Hx    Cancer Neg Hx    Colon polyps Neg Hx    Colon cancer Neg Hx    Esophageal cancer Neg Hx    Rectal cancer Neg Hx    Stomach cancer Neg Hx     Social History   Socioeconomic History    Marital status: Single    Spouse name: Not on file   Number of children: Not on file   Years of education: Not on file   Highest education level: 9th grade  Occupational History   Not on file  Tobacco Use   Smoking status: Former    Current packs/day: 0.00    Average packs/day: 0.5 packs/day for 33.0 years (16.5 ttl pk-yrs)    Types: Cigarettes    Start date: 27    Quit date: 2018    Years since quitting: 7.5   Smokeless tobacco: Never  Vaping Use   Vaping status: Never Used  Substance and Sexual Activity   Alcohol  use: No   Drug use: Not Currently    Types: Crack cocaine, Marijuana    Comment: clean since 2015; currently chews CBD gummies   Sexual activity: Yes    Partners: Male    Birth control/protection: None  Other Topics Concern   Not on file  Social History Narrative   Not on file   Social Drivers of Health   Financial Resource Strain: Low Risk  (09/10/2022)   Overall Financial Resource Strain (CARDIA)    Difficulty of Paying Living Expenses: Not hard at all  Food Insecurity: Patient Declined (12/06/2022)   Hunger Vital Sign    Worried About Running Out of Food in the Last Year: Patient declined  Ran Out of Food in the Last Year: Patient declined  Transportation Needs: Patient Declined (12/06/2022)   PRAPARE - Administrator, Civil Service (Medical): Patient declined    Lack of Transportation (Non-Medical): Patient declined  Physical Activity: Inactive (09/10/2022)   Exercise Vital Sign    Days of Exercise per Week: 0 days    Minutes of Exercise per Session: 0 min  Stress: No Stress Concern Present (09/10/2022)   [employer] of Occupational Health - Occupational Stress Questionnaire    Feeling of Stress : Not at all  Social Connections: Moderately Isolated (09/10/2022)   Social Connection and Isolation Panel    Frequency of Communication with Friends and Family: Twice a week    Frequency of Social Gatherings with Friends and Family:  Three times a week    Attends Religious Services: More than 4 times per year    Active Member of Clubs or Organizations: No    Attends Banker Meetings: Never    Marital Status: Never married    Allergies  Allergen Reactions   Penicillins Hives    Has patient had a PCN reaction causing immediate rash, facial/tongue/throat swelling, SOB or lightheadedness with hypotension: Yes Has patient had a PCN reaction causing severe rash involving mucus membranes or skin necrosis: Yes Has patient had a PCN reaction that required hospitalization No Has patient had a PCN reaction occurring within the last 10 years: Yes If all of the above answers are NO, then may proceed with Cephalosporin use.    Tomato     hives    Outpatient Medications Prior to Visit  Medication Sig Dispense Refill   ARIPiprazole  (ABILIFY ) 15 MG tablet Take 1 tablet (15 mg total) by mouth at bedtime. 30 tablet 2   atorvastatin  (LIPITOR) 10 MG tablet Take 1 tablet by mouth once daily 90 tablet 0   baclofen  (LIORESAL ) 10 MG tablet Take 1 tablet (10 mg total) by mouth 3 (three) times daily. 270 tablet 1   benztropine  (COGENTIN ) 0.5 MG tablet Take 1 tablet (0.5 mg total) by mouth at bedtime. 30 tablet 4   Blood Glucose Monitoring Suppl (GNP TRUE METRIX AIR METER) w/Device KIT Use to test blood sugar 3 times a day 1 kit 0   dapagliflozin  propanediol (FARXIGA ) 5 MG TABS tablet Take 1 tablet (5 mg total) by mouth daily before breakfast. 90 tablet 1   escitalopram  (LEXAPRO ) 5 MG tablet Take 1 tablet (5 mg total) by mouth daily. 30 tablet 2   gabapentin  (NEURONTIN ) 300 MG capsule Take 1 capsule (300 mg total) by mouth 3 (three) times daily. 90 capsule 1   glucose blood (GNP TRUE METRIX GLUCOSE STRIPS) test strip Use as instructed 100 each 12   latanoprost (XALATAN) 0.005 % ophthalmic solution Place 1 drop into both eyes at bedtime.     losartan -hydrochlorothiazide  (HYZAAR ) 50-12.5 MG tablet Take 1 tablet by mouth daily.  90 tablet 1   metFORMIN  (GLUCOPHAGE ) 500 MG tablet Take 500 mg by mouth every morning.     Misc. Devices [employer] Dagnosis- left knee pain 1 each 0   Multiple Vitamin (MULTIVITAMIN ADULT PO) Take by mouth daily.     Multiple Vitamins-Minerals (HAIR SKIN AND NAILS FORMULA PO) Take by mouth daily.     oxyCODONE -acetaminophen  (PERCOCET) 10-325 MG tablet Take 1 tablet by mouth 4 (four) times daily.     predniSONE  (DELTASONE ) 20 MG tablet 3 tabs daily x3 days, then 2 tabs daily x3 days, then 1 tab  daily x3 days, then one half tab daily x3 days, then stop 20 tablet 0   timolol (TIMOPTIC) 0.5 % ophthalmic solution 1 drop every morning.     traZODone  (DESYREL ) 100 MG tablet Take 1 tablet (100 mg total) by mouth at bedtime. 30 tablet 2   TRUEplus Lancets 33G MISC Use to test blood sugar 3 times a day 100 each 12   Vitamin D , Ergocalciferol , (DRISDOL ) 1.25 MG (50000 UNIT) CAPS capsule Take 1 capsule (50,000 Units total) by mouth once a week. 12 capsule 4   naloxone (NARCAN) nasal spray 4 mg/0.1 mL SMARTSIG:Both Nares (Patient not taking: Reported on 08/18/2023)     No facility-administered medications prior to visit.     ROS Review of Systems  Constitutional:  Negative for activity change and appetite change.  HENT:  Negative for sinus pressure and sore throat.   Respiratory:  Negative for chest tightness, shortness of breath and wheezing.   Cardiovascular:  Negative for chest pain and palpitations.  Gastrointestinal:  Negative for abdominal distention, abdominal pain and constipation.  Genitourinary: Negative.   Musculoskeletal:        See HPI  Psychiatric/Behavioral:  Negative for behavioral problems and dysphoric mood.     Objective:  BP 103/67   Pulse 63   Ht 5' 3 (1.6 m)   Wt 201 lb 9.6 oz (91.4 kg)   LMP 12/28/2010   SpO2 100%   BMI 35.71 kg/m      08/18/2023    2:03 PM 08/10/2023    8:22 AM 07/28/2023    1:39 PM  BP/Weight  Systolic BP 103 111   Diastolic BP 67 71   Wt.  (Lbs) 201.6    BMI 35.71 kg/m2       Information is confidential and restricted. Go to Review Flowsheets to unlock data.      Physical Exam Constitutional:      Appearance: She is well-developed.  Cardiovascular:     Rate and Rhythm: Normal rate.     Heart sounds: Normal heart sounds. No murmur heard. Pulmonary:     Effort: Pulmonary effort is normal.     Breath sounds: Normal breath sounds. No wheezing or rales.  Chest:     Chest wall: No tenderness.  Abdominal:     General: Bowel sounds are normal. There is no distension.     Palpations: Abdomen is soft. There is no mass.     Tenderness: There is no abdominal tenderness.  Musculoskeletal:     Right lower leg: No edema.     Left lower leg: No edema.     Comments: Severely restricted range of motion in both upper extremity with the right limited to 60 degrees on the left to 90 degrees.  Neurological:     Mental Status: She is alert and oriented to person, place, and time.  Psychiatric:        Mood and Affect: Mood normal.        Latest Ref Rng & Units 05/05/2023    4:20 PM 04/13/2023    6:19 PM 11/19/2022    9:12 AM  CMP  Glucose 70 - 99 mg/dL  92  897   BUN 6 - 20 mg/dL  21  16   Creatinine 9.55 - 1.00 mg/dL  9.16  9.15   Sodium 864 - 145 mmol/L  139  144   Potassium 3.5 - 5.2 mmol/L 4.2  3.3  4.6   Chloride 98 - 111 mmol/L  104  104   CO2 22 - 32 mmol/L  23  24   Calcium  8.9 - 10.3 mg/dL  89.9  89.5   Total Protein 6.5 - 8.1 g/dL  8.4    Total Bilirubin 0.0 - 1.2 mg/dL  0.6    Alkaline Phos 38 - 126 U/L  60    AST 15 - 41 U/L  21    ALT 0 - 44 U/L  18      Lipid Panel     Component Value Date/Time   CHOL 189 07/03/2022 1707   TRIG 86 07/03/2022 1707   HDL 74 07/03/2022 1707   CHOLHDL 2.6 07/03/2022 1707   LDLCALC 100 (H) 07/03/2022 1707    CBC    Component Value Date/Time   WBC 13.4 (H) 04/13/2023 1819   RBC 5.21 (H) 04/13/2023 1819   HGB 14.6 04/13/2023 1819   HGB 13.4 10/02/2021 1042   HCT  46.3 (H) 04/13/2023 1819   HCT 40.5 10/02/2021 1042   PLT 325 04/13/2023 1819   PLT 426 10/02/2021 1042   MCV 88.9 04/13/2023 1819   MCV 87 10/02/2021 1042   MCH 28.0 04/13/2023 1819   MCHC 31.5 04/13/2023 1819   RDW 14.0 04/13/2023 1819   RDW 12.3 10/02/2021 1042   LYMPHSABS 1.9 04/13/2023 1819   LYMPHSABS 3.7 (H) 10/02/2021 1042   MONOABS 0.8 04/13/2023 1819   EOSABS 0.0 04/13/2023 1819   EOSABS 0.2 10/02/2021 1042   BASOSABS 0.1 04/13/2023 1819   BASOSABS 0.1 10/02/2021 1042    Lab Results  Component Value Date   HGBA1C 6.2 05/05/2023       Assessment & Plan Bilateral adhesive capsulitis of shoulders Chronic bilateral shoulder pain with limited range of motion. Previous right shoulder corticosteroid injection provided temporary relief. Left shoulder not injected. Continues significant pain and stiffness despite oxycodone  and prednisone . Not open to surgery. - Refer to orthopedics for further evaluation and management. - Discuss surgical intervention with orthopedics if conservative measures fail.  Type 2 diabetes mellitus with diabetic neuropathy of left index finger Diabetes well controlled. Diabetic neuropathy causing numbness in left index finger. - Continue gabapentin  for neuropathy management.    No orders of the defined types were placed in this encounter.   Follow-up: Return in about 3 months (around 11/18/2023) for Chronic medical conditions.       Corrina Sabin, MD, FAAFP. [facility] [place], [place] [phone]   08/18/2023, 2:44 PM

## 2023-08-27 ENCOUNTER — Ambulatory Visit (INDEPENDENT_AMBULATORY_CARE_PROVIDER_SITE_OTHER): Admitting: Orthopaedic Surgery

## 2023-08-27 ENCOUNTER — Encounter: Payer: Self-pay | Admitting: Orthopaedic Surgery

## 2023-08-27 DIAGNOSIS — M25511 Pain in right shoulder: Secondary | ICD-10-CM | POA: Diagnosis not present

## 2023-08-27 DIAGNOSIS — M25512 Pain in left shoulder: Secondary | ICD-10-CM | POA: Diagnosis not present

## 2023-08-27 DIAGNOSIS — G8929 Other chronic pain: Secondary | ICD-10-CM | POA: Diagnosis not present

## 2023-08-27 MED ORDER — NAPROXEN 500 MG PO TABS: 500.0000 mg | ORAL_TABLET | Freq: Two times a day (BID) | ORAL | 3 refills | Status: DC

## 2023-08-27 NOTE — Progress Notes (Signed)
 Office Visit Note   Patient: Sydney Harris           Date of Birth: 08-23-1965           MRN: [id number] Visit Date: 08/27/2023              Requested by: Delbert Clam, MD [address] [place] [address] [place],  [place] [address] PCP: Delbert Clam, MD   Assessment & Plan: Visit Diagnoses:  1. Chronic pain of both shoulders     Plan: History of Present Illness Sydney Harris is a 58 year old female with diabetes who presents with bilateral shoulder pain. She was referred by her primary care doctor due to severe shoulder pain.  She has experienced severe bilateral shoulder pain for three months, significantly limiting her daily activities. She previously received a corticosteroid injection in her right shoulder and attempted physical therapy for two weeks but could not continue due to inability to hold her arms up. A short course of Percocet provided temporary relief, and she currently manages pain with naproxen . Her diabetes is a relevant factor in her condition. She has not had an MRI and is concerned about the procedure due to claustrophobia.  Physical Exam MUSCULOSKELETAL: Moderate restriction in shoulder flexibility with pain limiting movement and exam.  Poor participation to manual muscle testing due to guarding.  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 Previous treatments ineffective. Diabetes control may aid recovery. - Order MRI of both shoulders to rule out structural issues. - Prescribe naproxen  for pain management.  Follow-Up Instructions: No follow-ups on file.   Orders:  Orders Placed This Encounter  Procedures   MR Shoulder Left w/o contrast   MR SHOULDER RIGHT WO CONTRAST   Meds ordered this encounter  Medications   naproxen  (NAPROSYN ) 500 MG tablet    Sig: Take 1 tablet (500 mg total) by mouth 2 (two) times daily  with a meal.    Dispense:  30 tablet    Refill:  3      Procedures: No procedures performed   Clinical Data: No additional findings.   Subjective: Chief Complaint  Patient presents with   Right Shoulder - Pain   Left Shoulder - Pain    HPI  Review of Systems  Constitutional: Negative.   HENT: Negative.    Eyes: Negative.   Respiratory: Negative.    Cardiovascular: Negative.   Endocrine: Negative.   Musculoskeletal: Negative.   Neurological: Negative.   Hematological: Negative.   Psychiatric/Behavioral: Negative.    All other systems reviewed and are negative.    Objective: Vital Signs: LMP 12/28/2010   Physical Exam Vitals and nursing note reviewed.  Constitutional:      Appearance: She is well-developed.  HENT:     Head: Atraumatic.     Nose: Nose normal.  Eyes:     Extraocular Movements: Extraocular movements intact.  Cardiovascular:     Pulses: Normal pulses.  Pulmonary:     Effort: Pulmonary effort is normal.  Abdominal:     Palpations: Abdomen is soft.  Musculoskeletal:     Cervical back: Neck supple.  Skin:    General: Skin is warm.     Capillary Refill: Capillary refill takes less than 2 seconds.  Neurological:     Mental Status: She is alert. Mental status is at baseline.  Psychiatric:        Behavior: Behavior normal.  Thought Content: Thought content normal.        Judgment: Judgment normal.     Ortho Exam  Specialty Comments:  No specialty comments available.  Imaging: No results found.   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   Bipolar 1 disorder (HCC) 10/02/2021   Generalized anxiety disorder 10/02/2021   HTN (hypertension) 03/28/2020   Chronic bilateral low back pain without sciatica 12/28/2019   Hyperlipidemia associated with type 2 diabetes  mellitus (HCC) 12/22/2019   Chronic pain of left knee 12/21/2019   Type 2 diabetes mellitus with hyperglycemia, without long-term current use of insulin (HCC) 12/21/2019   Past Medical History:  Diagnosis Date   Anxiety    Arthritis of left knee    Bipolar disorder (HCC)    Blood transfusion 1981   related to my daughter being born   Depression    Diabetes mellitus without complication (HCC)    Fibroid tumor    Gout    Hypertension    Plantar fasciitis    Substance abuse (HCC)    crack cocaine  14 yrs sober.    Family History  Problem Relation Age of Onset   Diabetes Father    Asthma Daughter    Heart failure Neg Hx    Cancer Neg Hx    Colon polyps Neg Hx    Colon cancer Neg Hx    Esophageal cancer Neg Hx    Rectal cancer Neg Hx    Stomach cancer Neg Hx     Past Surgical History:  Procedure Laterality Date   ABDOMINAL HYSTERECTOMY     for fibroid tumors   Social History   Occupational History   Not on file  Tobacco Use   Smoking status: Former    Current packs/day: 0.00    Average packs/day: 0.5 packs/day for 33.0 years (16.5 ttl pk-yrs)    Types: Cigarettes    Start date: 27    Quit date: 2018    Years since quitting: 7.6   Smokeless tobacco: Never  Vaping Use   Vaping status: Never Used  Substance and Sexual Activity   Alcohol  use: No   Drug use: Not Currently    Types: Crack cocaine, Marijuana    Comment: clean since 2015; currently chews CBD gummies   Sexual activity: Yes    Partners: Male    Birth control/protection: None

## 2023-09-02 DIAGNOSIS — Z79899 Other long term (current) drug therapy: Secondary | ICD-10-CM | POA: Diagnosis not present

## 2023-09-02 DIAGNOSIS — M1712 Unilateral primary osteoarthritis, left knee: Secondary | ICD-10-CM | POA: Diagnosis not present

## 2023-09-02 DIAGNOSIS — M5134 Other intervertebral disc degeneration, thoracic region: Secondary | ICD-10-CM | POA: Diagnosis not present

## 2023-09-02 DIAGNOSIS — E1169 Type 2 diabetes mellitus with other specified complication: Secondary | ICD-10-CM | POA: Diagnosis not present

## 2023-09-02 DIAGNOSIS — Z9181 History of falling: Secondary | ICD-10-CM | POA: Diagnosis not present

## 2023-09-02 DIAGNOSIS — E7800 Pure hypercholesterolemia, unspecified: Secondary | ICD-10-CM | POA: Diagnosis not present

## 2023-09-09 DIAGNOSIS — Z79899 Other long term (current) drug therapy: Secondary | ICD-10-CM | POA: Diagnosis not present

## 2023-09-14 NOTE — Progress Notes (Unsigned)
 BH MD/PA/NP OP Progress Note  09/14/2023 11:58 AM Sydney Harris  MRN:  [id number]  Visit Diagnosis: No diagnosis found.  Assessment: Sydney Harris is a 58 y.o. female with a history of bipolar disorder, schizophrenia, previous substance (cocaine) abuse,chronic pain, type 2 diabetes mellitus, and hypertension who presented to [facility] for initial evaluation on 07/28/2023.    At initial evaluation patient reported a history of bipolar disorder and went on to describe manic episodes.  During periods of mania patient had experienced decreased need for sleep, increased energy, pressured speech, disorganized thought process, and auditory/visual hallucinations.  She had been stable on medications for a number of years ago until a reoccurrence of symptoms towards the beginning of 2025 after her provider retired and she was off medications.  Patient has been experiencing increased fluctuation of moods with depression, irritability, and mania since then.  She has had thoughts of SI during this time but denied any intent to act on them.  She also has had thoughts of HI and was charged with assault.  Patient reported that this was more an impulsive action and not due to command hallucinations.  She does experience command hallucinations she has been able to prevent herself from acting on them.  In addition to the bipolar disorder patient has a history of anxiety and marijuana use.  Given patient's reoccurrence of symptoms after being stable for many years it would be appropriate to restart her on her former medication regimen.  Counseling was also provided regarding her marijuana use.  In addition to the adverse effects could have on her mental health it will also lead to the discontinuation of her pain medication management.  Recommended the patient discontinue her use and discussed some strategies on how to facilitate this.  Sydney Harris presents for  follow-up evaluation. Today, 09/14/23, patient reports ***  Risk Assessment: An assessment of suicide and violence risk factors was performed as part of this evaluation and is not significantly changed from the last visit. While future psychiatric events cannot be accurately predicted, the patient does not currently require acute inpatient psychiatric care and does not currently meet [place]  involuntary commitment criteria. Patient was given contact information for crisis resources, behavioral health clinic and was instructed to call 911 for emergencies.   Plan: # Bipolar disorder Past medication trials:  Status of problem: Ongoing Interventions: - Restart Abilify  15 mg daily - CMP, CBC. A1c revieiwed  - due for lipid panel, repeat at next visit  # Marijuana use diroder Past medication trials:  Status of problem: Ongoing Interventions: -- Recommended cessation  # GAD Past medication trials:  Status of problem: Ongoing Interventions: - Restart Lexapro  5 mg daily - Restart trazodone  100 mg at bedtime  Chief Complaint: No chief complaint on file.  HPI: ***   Past Psychiatric History:  Past psychiatric diagnoses: Bipolar disorder, schizophrenia Psychiatric hospitalizations:Patient has been hospitalized at [facility] in the 90's Past suicide attempts: Denies Hx of self harm: Denies Hx of violence towards others: Assault charges recently charges dropped Prior psychiatric providers: PCP Dr. Brien managed him for long time, [facility] in the past Prior therapy: Denies Access to firearms: Denies  Prior medication trials: Denies   Substance use: She reports that she smokes marijuana and it calms her down. She finds it helpful and has been doing it for years. She wants to stop as she is on pain medication and does not want to lose it. When she was on  her psychiatric medication in the past she did not smoke as much.  She has used cocaine in the past with her last use being in 2023.   Patient has attended treatment for substance use in the past.  Past Medical History:  Past Medical History:  Diagnosis Date   Anxiety    Arthritis of left knee    Bipolar disorder (HCC)    Blood transfusion 1981   related to my daughter being born   Depression    Diabetes mellitus without complication (HCC)    Fibroid tumor    Gout    Hypertension    Plantar fasciitis    Substance abuse (HCC)    crack cocaine  14 yrs sober.    Past Surgical History:  Procedure Laterality Date   ABDOMINAL HYSTERECTOMY     for fibroid tumors    Family Psychiatric History: ***  Family History:  Family History  Problem Relation Age of Onset   Diabetes Father    Asthma Daughter    Heart failure Neg Hx    Cancer Neg Hx    Colon polyps Neg Hx    Colon cancer Neg Hx    Esophageal cancer Neg Hx    Rectal cancer Neg Hx    Stomach cancer Neg Hx     Social History:  Social History   Socioeconomic History   Marital status: Single    Spouse name: Not on file   Number of children: Not on file   Years of education: Not on file   Highest education level: 9th grade  Occupational History   Not on file  Tobacco Use   Smoking status: Former    Current packs/day: 0.00    Average packs/day: 0.5 packs/day for 33.0 years (16.5 ttl pk-yrs)    Types: Cigarettes    Start date: 70    Quit date: 2018    Years since quitting: 7.6   Smokeless tobacco: Never  Vaping Use   Vaping status: Never Used  Substance and Sexual Activity   Alcohol  use: No   Drug use: Not Currently    Types: Crack cocaine, Marijuana    Comment: clean since 2015; currently chews CBD gummies   Sexual activity: Yes    Partners: Male    Birth control/protection: None  Other Topics Concern   Not on file  Social History Narrative   Not on file   Social Drivers of Health   Financial Resource Strain: Low Risk  (09/10/2022)   Overall Financial Resource Strain (CARDIA)    Difficulty of Paying Living Expenses: Not  hard at all  Food Insecurity: Patient Declined (12/06/2022)   Hunger Vital Sign    Worried About Running Out of Food in the Last Year: Patient declined    Ran Out of Food in the Last Year: Patient declined  Transportation Needs: Patient Declined (12/06/2022)   PRAPARE - Administrator, Civil Service (Medical): Patient declined    Lack of Transportation (Non-Medical): Patient declined  Physical Activity: Inactive (09/10/2022)   Exercise Vital Sign    Days of Exercise per Week: 0 days    Minutes of Exercise per Session: 0 min  Stress: No Stress Concern Present (09/10/2022)   [employer] of Occupational Health - Occupational Stress Questionnaire    Feeling of Stress : Not at all  Social Connections: Moderately Isolated (09/10/2022)   Social Connection and Isolation Panel    Frequency of Communication with Friends and Family: Twice a week  Frequency of Social Gatherings with Friends and Family: Three times a week    Attends Religious Services: More than 4 times per year    Active Member of Clubs or Organizations: No    Attends Banker Meetings: Never    Marital Status: Never married    Allergies:  Allergies  Allergen Reactions   Penicillins Hives    Has patient had a PCN reaction causing immediate rash, facial/tongue/throat swelling, SOB or lightheadedness with hypotension: Yes Has patient had a PCN reaction causing severe rash involving mucus membranes or skin necrosis: Yes Has patient had a PCN reaction that required hospitalization No Has patient had a PCN reaction occurring within the last 10 years: Yes If all of the above answers are NO, then may proceed with Cephalosporin use.    Tomato     hives    Current Medications: Current Outpatient Medications  Medication Sig Dispense Refill   ARIPiprazole  (ABILIFY ) 15 MG tablet Take 1 tablet (15 mg total) by mouth at bedtime. 30 tablet 2   atorvastatin  (LIPITOR) 10 MG tablet Take 1 tablet by mouth  once daily 90 tablet 0   baclofen  (LIORESAL ) 10 MG tablet Take 1 tablet (10 mg total) by mouth 3 (three) times daily. 270 tablet 1   benztropine  (COGENTIN ) 0.5 MG tablet Take 1 tablet (0.5 mg total) by mouth at bedtime. 30 tablet 4   Blood Glucose Monitoring Suppl (GNP TRUE METRIX AIR METER) w/Device KIT Use to test blood sugar 3 times a day 1 kit 0   dapagliflozin  propanediol (FARXIGA ) 5 MG TABS tablet Take 1 tablet (5 mg total) by mouth daily before breakfast. 90 tablet 1   escitalopram  (LEXAPRO ) 5 MG tablet Take 1 tablet (5 mg total) by mouth daily. 30 tablet 2   gabapentin  (NEURONTIN ) 300 MG capsule Take 1 capsule (300 mg total) by mouth 3 (three) times daily. 90 capsule 1   glucose blood (GNP TRUE METRIX GLUCOSE STRIPS) test strip Use as instructed 100 each 12   latanoprost (XALATAN) 0.005 % ophthalmic solution Place 1 drop into both eyes at bedtime.     losartan -hydrochlorothiazide  (HYZAAR ) 50-12.5 MG tablet Take 1 tablet by mouth daily. 90 tablet 1   metFORMIN  (GLUCOPHAGE ) 500 MG tablet Take 500 mg by mouth every morning.     Misc. Devices [employer] Dagnosis- left knee pain 1 each 0   Multiple Vitamin (MULTIVITAMIN ADULT PO) Take by mouth daily.     Multiple Vitamins-Minerals (HAIR SKIN AND NAILS FORMULA PO) Take by mouth daily.     naloxone (NARCAN) nasal spray 4 mg/0.1 mL SMARTSIG:Both Nares (Patient not taking: Reported on 08/18/2023)     naproxen  (NAPROSYN ) 500 MG tablet Take 1 tablet (500 mg total) by mouth 2 (two) times daily with a meal. 30 tablet 3   oxyCODONE -acetaminophen  (PERCOCET) 10-325 MG tablet Take 1 tablet by mouth 4 (four) times daily.     predniSONE  (DELTASONE ) 20 MG tablet 3 tabs daily x3 days, then 2 tabs daily x3 days, then 1 tab daily x3 days, then one half tab daily x3 days, then stop 20 tablet 0   timolol (TIMOPTIC) 0.5 % ophthalmic solution 1 drop every morning.     traZODone  (DESYREL ) 100 MG tablet Take 1 tablet (100 mg total) by mouth at bedtime. 30 tablet 2    TRUEplus Lancets 33G MISC Use to test blood sugar 3 times a day 100 each 12   Vitamin D , Ergocalciferol , (DRISDOL ) 1.25 MG (50000 UNIT) CAPS capsule Take  1 capsule (50,000 Units total) by mouth once a week. 12 capsule 4   No current facility-administered medications for this visit.     Musculoskeletal: Strength & Muscle Tone: {desc; muscle tone:32375} Gait & Station: {PE GAIT ED WJUO:77474} Patient leans: {Patient Leans:21022755}  Psychiatric Specialty Exam: Last menstrual period 12/28/2010.There is no height or weight on file to calculate BMI. Review of Systems  General Appearance: {Appearance:22683}  Eye Contact:  {BHH EYE CONTACT:22684}  Speech:  {Speech:22685}  Volume:  {Volume (PAA):22686}  Mood:  {BHH MOOD:22306}  Affect:  {Affect (PAA):22687}  Thought Content: {Thought Content:22690}   Suicidal Thoughts:  {ST/HT (PAA):22692}  Homicidal Thoughts:  {ST/HT (PAA):22692}  Thought Process:  {Thought Process (PAA):22688}  Orientation:  {BHH ORIENTATION (PAA):22689}    Memory: {BHH MEMORY:22881}  Judgment:  {Judgement (PAA):22694}  Insight:  {Insight (PAA):22695}  Concentration:  {Concentration:21399}  Recall:  not formally assessed ***  Fund of Knowledge: {BHH GOOD/FAIR/POOR:22877}  Language: {BHH GOOD/FAIR/POOR:22877}  Psychomotor Activity:  {Psychomotor (PAA):22696}  Akathisia:  {BHH YES OR NO:22294}  AIMS (if indicated): {Desc; done/not:10129}  Assets:  {Assets (PAA):22698}  ADL's:  {BHH JIO'D:77709}  Cognition: {chl bhh cognition:304700322}  Sleep:  {BHH GOOD/FAIR/POOR:22877}   Metabolic Disorder Labs: Lab Results  Component Value Date   HGBA1C 6.2 05/05/2023   No results found for: PROLACTIN Lab Results  Component Value Date   CHOL 189 07/03/2022   TRIG 86 07/03/2022   HDL 74 07/03/2022   CHOLHDL 2.6 07/03/2022   LDLCALC 100 (H) 07/03/2022   LDLCALC 118 (H) 10/02/2021   No results found for: TSH  Therapeutic Level Labs: No results found for:  LITHIUM No results found for: VALPROATE No results found for: CBMZ   Screenings: GAD-7    Flowsheet Row [facility] Office Visit from 08/13/2022 in [facility] [place] - A Dept Of [place]. [facility] Office Visit from 02/12/2022 in [facility] - A Dept Of [place]. [facility] Office Visit from 07/30/2020 in [facility] [place] - A Dept Of [place] [facility]. [facility] Office Visit from 03/28/2020 in [facility] [place] - A Dept Of [place]. [facility]  Total GAD-7 Score 15 10 7 7 6    PHQ2-9    Flowsheet Row Office Visit from 07/28/2023 [facility] Clinical Support from 09/10/2022 in [facility] [place] - A Dept Of [place]. [facility] Office Visit from 08/13/2022 in [facility] - A Dept Of [place] [facility]. [facility] Office Visit from 02/12/2022 in [place] - A Dept Of [place] [facility]. [facility] Clinical Support from 06/07/2021 in [facility]  PHQ-2 Total Score 3 2 4 4 1   PHQ-9 Total Score 7 9 14 7  --   Flowsheet Row UC from 08/10/2023 in [facility] Visit from 07/28/2023 in [facility] from 04/13/2023 in [facility] Emergency Department at [facility]  C-SSRS RISK CATEGORY No Risk No Risk No Risk    Collaboration of Care: Collaboration of Care: Medication Management AEB medication prescription, Primary Care Provider AEB chart review, and Other provider involved in patient's care AEB urgent care and orthopedics chart review  Patient/Guardian was advised Release of Information must be obtained prior to any record release in order to collaborate their care  with an outside provider. Patient/Guardian was advised if they  have not already done so to contact the registration department to sign all necessary forms in order for us  to release information regarding their care.   Consent: Patient/Guardian gives verbal consent for treatment and assignment of benefits for services provided during this visit. Patient/Guardian expressed understanding and agreed to proceed.    Sydney CHRISTELLA Finder, MD 09/14/2023, 11:58 AM

## 2023-09-15 ENCOUNTER — Ambulatory Visit
Admission: RE | Admit: 2023-09-15 | Discharge: 2023-09-15 | Disposition: A | Discharge status: Home / Self Care | Source: Ambulatory Visit | Attending: Orthopaedic Surgery | Admitting: Orthopaedic Surgery

## 2023-09-15 ENCOUNTER — Encounter (HOSPITAL_BASED_OUTPATIENT_CLINIC_OR_DEPARTMENT_OTHER): Admitting: Psychiatry

## 2023-09-15 ENCOUNTER — Telehealth (HOSPITAL_COMMUNITY): Payer: Self-pay | Admitting: Psychiatry

## 2023-09-15 DIAGNOSIS — G8929 Other chronic pain: Secondary | ICD-10-CM

## 2023-09-15 DIAGNOSIS — F319 Bipolar disorder, unspecified: Secondary | ICD-10-CM

## 2023-09-15 NOTE — Telephone Encounter (Signed)
 Patient was contacted via telephone at 9:35 for approximately 5 minutes.  She reported having forgotten about the appointment due to having another appointment for MRI later this day.  Patient reports that she does not have access to my chart to check if there were multiple appointments.  Recommended that she reach out to my chart support to correct this.  Patient was rescheduled for follow-up on September 30 at 8:30.  She was notified that today's appointment was marked as a no-show

## 2023-09-16 NOTE — Progress Notes (Signed)
 This encounter was created in error - please disregard.

## 2023-09-19 ENCOUNTER — Other Ambulatory Visit: Payer: Self-pay | Admitting: Family Medicine

## 2023-09-21 NOTE — Telephone Encounter (Signed)
 Requested Prescriptions  Pending Prescriptions Disp Refills   atorvastatin  (LIPITOR) 10 MG tablet [Pharmacy Med Name: Atorvastatin  Calcium  10 MG Oral Tablet] 90 tablet 0    Sig: Take 1 tablet by mouth once daily     Cardiovascular:  Antilipid - Statins Failed - 09/21/2023 10:40 AM      Failed - Lipid Panel in normal range within the last 12 months    Cholesterol, Total  Date Value Ref Range Status  07/03/2022 189 100 - 199 mg/dL Final   LDL Chol Calc (NIH)  Date Value Ref Range Status  07/03/2022 100 (H) 0 - 99 mg/dL Final   HDL  Date Value Ref Range Status  07/03/2022 74 >39 mg/dL Final   Triglycerides  Date Value Ref Range Status  07/03/2022 86 0 - 149 mg/dL Final         Passed - Patient is not pregnant      Passed - Valid encounter within last 12 months    Recent Outpatient Visits           1 month ago Adhesive capsulitis of both shoulders   [place] [facility] [place] - A Dept Of [employer]. [facility] Delbert Clam, MD   4 months ago Type 2 diabetes mellitus with other specified complication, without long-term current use of insulin (HCC)   [place] [facility] [place] - A Dept Of [place]. [facility] Delbert Clam, MD   4 months ago Type 2 diabetes mellitus with other specified complication, without long-term current use of insulin (HCC)   [place] [facility] [place] - A Dept Of [place]. [facility] Delbert Clam, MD   10 months ago Type 2 diabetes mellitus with hyperglycemia, without long-term current use of insulin (HCC)   [place] [facility] [place] - A Dept Of [place]. [facility] Delbert Clam, MD   1 year ago Chronic pain of left knee   [place] [facility] [place] - A Dept Of [place]. [facility] Brien Belvie BRAVO, MD

## 2023-09-23 ENCOUNTER — Telehealth: Payer: Self-pay | Admitting: Orthopaedic Surgery

## 2023-09-23 ENCOUNTER — Telehealth: Payer: Self-pay | Admitting: Family Medicine

## 2023-09-23 NOTE — Telephone Encounter (Signed)
 Pt called stating she could not go thru with MRI due to her being claustaphobic. Pt is asking to change location to open location at [facility], Please call pt about this matter at [phone].

## 2023-09-23 NOTE — Telephone Encounter (Signed)
 Copied from CRM [id number]. Topic: Clinical - Request for Lab/Test Order >> Sep 22, 2023  4:05 PM Sydney Harris wrote: Reason for CRM: patient stated she went to get her MRI on her left and right shoulder today but she was not able to get it done due to being claustrophobic and will need another order sent to [facility] at [address] [place], [place] [address] to have it done. Please f/u with patient to let her know when its scheduled

## 2023-09-23 NOTE — Telephone Encounter (Signed)
 Copied from CRM #[id number]. Topic: General - Other >> Sep 23, 2023  2:16 PM Fonda T wrote:  Reason for CRM: Received call from patient, states she is returning call to office, Alycia reagrding an imaging issue.  Called and spoke to office, per Zara advised to send CRM to admin pool, as Alycia was not available at time of call.  Patient requesting a return call, can be reached at [phone].

## 2023-09-23 NOTE — Telephone Encounter (Signed)
PT HAS BEEN INFORMED

## 2023-09-23 NOTE — Telephone Encounter (Signed)
 Attempted to contact patient to inform her that MRI was ordered by her orthopedic office , she will need to reach out to them to get the location changed.

## 2023-09-24 NOTE — Telephone Encounter (Signed)
 This has been faxed over to [facility]

## 2023-10-01 DIAGNOSIS — Z79899 Other long term (current) drug therapy: Secondary | ICD-10-CM | POA: Diagnosis not present

## 2023-10-01 DIAGNOSIS — Z9181 History of falling: Secondary | ICD-10-CM | POA: Diagnosis not present

## 2023-10-01 DIAGNOSIS — I10 Essential (primary) hypertension: Secondary | ICD-10-CM | POA: Diagnosis not present

## 2023-10-01 DIAGNOSIS — M5134 Other intervertebral disc degeneration, thoracic region: Secondary | ICD-10-CM | POA: Diagnosis not present

## 2023-10-01 DIAGNOSIS — R825 Elevated urine levels of drugs, medicaments and biological substances: Secondary | ICD-10-CM | POA: Diagnosis not present

## 2023-10-01 DIAGNOSIS — M1712 Unilateral primary osteoarthritis, left knee: Secondary | ICD-10-CM | POA: Diagnosis not present

## 2023-10-15 ENCOUNTER — Other Ambulatory Visit (HOSPITAL_COMMUNITY): Payer: Self-pay | Admitting: Psychiatry

## 2023-10-15 DIAGNOSIS — F319 Bipolar disorder, unspecified: Secondary | ICD-10-CM

## 2023-10-15 DIAGNOSIS — F411 Generalized anxiety disorder: Secondary | ICD-10-CM

## 2023-10-19 NOTE — Progress Notes (Unsigned)
 BH MD/PA/NP OP Progress Note  10/20/2023 8:47 AM Sydney Harris  MRN:  [id number]  Visit Diagnosis:    ICD-10-CM   1. Bipolar 1 disorder (HCC)  F31.9 ARIPiprazole  (ABILIFY ) 15 MG tablet    2. Generalized anxiety disorder  F41.1 traZODone  (DESYREL ) 100 MG tablet    escitalopram  (LEXAPRO ) 10 MG tablet      Assessment: Sydney Harris is a 58 y.o. female with a history of bipolar disorder, schizophrenia, previous substance (cocaine) abuse,chronic pain, type 2 diabetes mellitus, and hypertension who presented to [facility] at [facility] for initial evaluation on 07/28/2023.    At initial evaluation patient reported a history of bipolar disorder and went on to describe manic episodes.  During periods of mania patient will experience decreased need for sleep, increased energy, pressured speech, disorganized thought process, and auditory/visual hallucinations.  She had been stable medications for a number of years and had a reoccurrence in symptoms towards the beginning of 2025 after her provider retired and she was off medications.  Patient has been experiencing increased fluctuation of moods with depression, irritability, and mania since then.  She has had thoughts of SI during this time but denied any intent to act on them.  She also has had thoughts of HI and was charged with assault.  Patient reported that this was more an impulsive action and not due to command hallucinations.  She does experience command hallucinations she has been able to prevent herself from acting on them.  In addition to the bipolar disorder patient does have a history of anxiety and marijuana use.  Sydney Harris presents for follow-up evaluation. Today, 10/20/23, patient reports mild improvement in mood symptoms with improved stability following reinitiation of medications.  She denies any adverse medication side effects.  We will titrate the Lexapro  to 10 mg daily and reviewed the  risks and benefits.  Patient will follow-up in 3 months.  Risk Assessment: An assessment of suicide and violence risk factors was performed as part of this evaluation and is not significantly changed from the last visit. While future psychiatric events cannot be accurately predicted, the patient does not currently require acute inpatient psychiatric care and does not currently meet [place]  involuntary commitment criteria. Patient was given contact information for crisis resources, behavioral health clinic and was instructed to call 911 for emergencies.   Plan: # Bipolar disorder Past medication trials:  Status of problem: Ongoing Interventions: - Continue Abilify  15 mg daily - CMP, CBC. A1c revieiwed  - due for lipid panel, repeat at next visit  # Marijuana use diroder Past medication trials:  Status of problem: Ongoing Interventions: -- Recommended cessation  # GAD Past medication trials:  Status of problem: Ongoing Interventions: - Increase Lexapro  to 10 mg daily - Continue trazodone  100 mg at bedtime   Chief Complaint:  Chief Complaint  Patient presents with   Follow-up   HPI: Sydney Harris reports everything has been ok in the interim.  There have been 2 deaths recently which has been taking a toll on her.  They are both of close friends and for the next funeral in a couple days she is going to be cooking for the family.  Patient is getting more depressed as he gets closer to the funeral.  She also endorsed ongoing financial stressors.  She is on [employer] however had been working a part-time job on top of that.  Due to this she is receiving only $400 a month even though  she has left the job.  This is not enough for her to live on his rent alone is $750.  Sydney Harris is relying on her supports of church and faith to get through this and is considering finding another job.  She has been taking the medication consistently denies any adverse side effects.  Of note the  Lexapro  was started at lower dose than she had been on in the past.  We will titrate the Lexapro  to 10 mg a day reviewed the risks and benefits.  Past Psychiatric History:  Past psychiatric diagnoses: Bipolar disorder, schizophrenia Psychiatric hospitalizations:Patient has been hospitalized at [facility] in the 90's Past suicide attempts: Denies Hx of self harm: Denies Hx of violence towards others: Assault charges recently charges dropped Prior psychiatric providers: PCP Dr. Brien managed him for long time, [facility] in the past Prior therapy: Denies Access to firearms: Denies  Prior medication trials: Denies   Substance use: She reports that she smokes marijuana and it calms her down. She finds it helpful and has been doing it for years. She wants to stop as she is on pain medication and does not want to lose it. When she was on her psychiatric medication in the past she did not smoke as much.  She has used cocaine in the past with her last use being in 2023.  Patient has attended treatment for substance use in the past.  Past Medical History:  Past Medical History:  Diagnosis Date   Anxiety    Arthritis of left knee    Bipolar disorder (HCC)    Blood transfusion 1981   related to my daughter being born   Depression    Diabetes mellitus without complication (HCC)    Fibroid tumor    Gout    Hypertension    Plantar fasciitis    Substance abuse (HCC)    crack cocaine  14 yrs sober.    Past Surgical History:  Procedure Laterality Date   ABDOMINAL HYSTERECTOMY     for fibroid tumors    Family History:  Family History  Problem Relation Age of Onset   Diabetes Father    Asthma Daughter    Heart failure Neg Hx    Cancer Neg Hx    Colon polyps Neg Hx    Colon cancer Neg Hx    Esophageal cancer Neg Hx    Rectal cancer Neg Hx    Stomach cancer Neg Hx     Social History:  Social History   Socioeconomic History   Marital status: Single    Spouse name: Not on file    Number of children: Not on file   Years of education: Not on file   Highest education level: 9th grade  Occupational History   Not on file  Tobacco Use   Smoking status: Former    Current packs/day: 0.00    Average packs/day: 0.5 packs/day for 33.0 years (16.5 ttl pk-yrs)    Types: Cigarettes    Start date: 74    Quit date: 2018    Years since quitting: 7.7   Smokeless tobacco: Never  Vaping Use   Vaping status: Never Used  Substance and Sexual Activity   Alcohol  use: No   Drug use: Not Currently    Types: Crack cocaine, Marijuana    Comment: clean since 2015; currently chews CBD gummies   Sexual activity: Yes    Partners: Male    Birth control/protection: None  Other Topics Concern   Not on file  Social History Narrative   Not on file   Social Drivers of Health   Financial Resource Strain: Low Risk  (09/10/2022)   Overall Financial Resource Strain (CARDIA)    Difficulty of Paying Living Expenses: Not hard at all  Food Insecurity: Patient Declined (12/06/2022)   Hunger Vital Sign    Worried About Running Out of Food in the Last Year: Patient declined    Ran Out of Food in the Last Year: Patient declined  Transportation Needs: Patient Declined (12/06/2022)   PRAPARE - Administrator, Civil Service (Medical): Patient declined    Lack of Transportation (Non-Medical): Patient declined  Physical Activity: Inactive (09/10/2022)   Exercise Vital Sign    Days of Exercise per Week: 0 days    Minutes of Exercise per Session: 0 min  Stress: No Stress Concern Present (09/10/2022)   [employer] of Occupational Health - Occupational Stress Questionnaire    Feeling of Stress : Not at all  Social Connections: Moderately Isolated (09/10/2022)   Social Connection and Isolation Panel    Frequency of Communication with Friends and Family: Twice a week    Frequency of Social Gatherings with Friends and Family: Three times a week    Attends Religious Services: More  than 4 times per year    Active Member of Clubs or Organizations: No    Attends Banker Meetings: Never    Marital Status: Never married    Allergies:  Allergies  Allergen Reactions   Penicillins Hives    Has patient had a PCN reaction causing immediate rash, facial/tongue/throat swelling, SOB or lightheadedness with hypotension: Yes Has patient had a PCN reaction causing severe rash involving mucus membranes or skin necrosis: Yes Has patient had a PCN reaction that required hospitalization No Has patient had a PCN reaction occurring within the last 10 years: Yes If all of the above answers are NO, then may proceed with Cephalosporin use.    Tomato     hives    Current Medications: Current Outpatient Medications  Medication Sig Dispense Refill   ARIPiprazole  (ABILIFY ) 15 MG tablet Take 1 tablet (15 mg total) by mouth at bedtime. 30 tablet 2   atorvastatin  (LIPITOR) 10 MG tablet Take 1 tablet by mouth once daily 90 tablet 0   baclofen  (LIORESAL ) 10 MG tablet Take 1 tablet (10 mg total) by mouth 3 (three) times daily. 270 tablet 1   benztropine  (COGENTIN ) 0.5 MG tablet Take 1 tablet (0.5 mg total) by mouth at bedtime. 30 tablet 4   Blood Glucose Monitoring Suppl (GNP TRUE METRIX AIR METER) w/Device KIT Use to test blood sugar 3 times a day 1 kit 0   dapagliflozin  propanediol (FARXIGA ) 5 MG TABS tablet Take 1 tablet (5 mg total) by mouth daily before breakfast. 90 tablet 1   escitalopram  (LEXAPRO ) 10 MG tablet Take 1 tablet (10 mg total) by mouth daily. 30 tablet 2   gabapentin  (NEURONTIN ) 300 MG capsule Take 1 capsule (300 mg total) by mouth 3 (three) times daily. 90 capsule 1   glucose blood (GNP TRUE METRIX GLUCOSE STRIPS) test strip Use as instructed 100 each 12   latanoprost (XALATAN) 0.005 % ophthalmic solution Place 1 drop into both eyes at bedtime.     losartan -hydrochlorothiazide  (HYZAAR ) 50-12.5 MG tablet Take 1 tablet by mouth daily. 90 tablet 1   metFORMIN   (GLUCOPHAGE ) 500 MG tablet Take 500 mg by mouth every morning.     Misc. Devices [employer] Dagnosis- left  knee pain 1 each 0   Multiple Vitamin (MULTIVITAMIN ADULT PO) Take by mouth daily.     Multiple Vitamins-Minerals (HAIR SKIN AND NAILS FORMULA PO) Take by mouth daily.     naloxone (NARCAN) nasal spray 4 mg/0.1 mL SMARTSIG:Both Nares (Patient not taking: Reported on 08/18/2023)     naproxen  (NAPROSYN ) 500 MG tablet Take 1 tablet (500 mg total) by mouth 2 (two) times daily with a meal. 30 tablet 3   oxyCODONE -acetaminophen  (PERCOCET) 10-325 MG tablet Take 1 tablet by mouth 4 (four) times daily.     predniSONE  (DELTASONE ) 20 MG tablet 3 tabs daily x3 days, then 2 tabs daily x3 days, then 1 tab daily x3 days, then one half tab daily x3 days, then stop 20 tablet 0   timolol (TIMOPTIC) 0.5 % ophthalmic solution 1 drop every morning.     traZODone  (DESYREL ) 100 MG tablet Take 1 tablet (100 mg total) by mouth at bedtime. 30 tablet 2   TRUEplus Lancets 33G MISC Use to test blood sugar 3 times a day 100 each 12   Vitamin D , Ergocalciferol , (DRISDOL ) 1.25 MG (50000 UNIT) CAPS capsule Take 1 capsule (50,000 Units total) by mouth once a week. 12 capsule 4   No current facility-administered medications for this visit.     Musculoskeletal: Strength & Muscle Tone: within normal limits Gait & Station: normal Patient leans: N/A  Psychiatric Specialty Exam: Last menstrual period 12/28/2010.There is no height or weight on file to calculate BMI. Review of Systems  General Appearance: Well Groomed  Eye Contact:  Good  Speech:  Clear and Coherent and Normal Rate  Volume:  Normal  Mood:  dysthymic  Affect:  Congruent  Thought Content: Logical   Suicidal Thoughts:  No  Homicidal Thoughts:  No  Thought Process:  Coherent and Goal Directed  Orientation:  Full (Time, Place, and Person)    Memory: Immediate;   Fair  Judgment:  Fair  Insight:  Fair  Concentration:  Concentration: Fair  Recall:  not  formally assessed   Fund of Knowledge: Fair  Language: Good  Psychomotor Activity:  Normal  Akathisia:  No  AIMS (if indicated): not done  Assets:  Communication Skills Desire for Improvement Housing Transportation  ADL's:  Intact  Cognition: WNL  Sleep:  Good   Metabolic Disorder Labs: Lab Results  Component Value Date   HGBA1C 6.2 05/05/2023   No results found for: PROLACTIN Lab Results  Component Value Date   CHOL 189 07/03/2022   TRIG 86 07/03/2022   HDL 74 07/03/2022   CHOLHDL 2.6 07/03/2022   LDLCALC 100 (H) 07/03/2022   LDLCALC 118 (H) 10/02/2021   No results found for: TSH  Therapeutic Level Labs: No results found for: LITHIUM No results found for: VALPROATE No results found for: CBMZ   Screenings: GAD-7    Flowsheet Row [facility] Office Visit from 08/13/2022 in [facility] [place] - A Dept Of [place]. [facility] Office Visit from 02/12/2022 in [facility] - A Dept Of [place]. [facility] Office Visit from 07/30/2020 in [facility] [place] - A Dept Of [place]. [facility] Office Visit from 03/28/2020 in [facility] [place] - A Dept Of [place]. [facility]  Total GAD-7 Score 15 10 7 7 6    PHQ2-9    Flowsheet Row [facility]28[facility]23 [facility] Clinical Support from 09/10/2022 in [facility] - A Dept Of [place]. [facility] Office Visit from 08/13/2022 in [facility] - A Dept Of [place]. [facility] Office Visit from 02/12/2022 in [facility] [place] - A Dept Of [place]. [facility] Clinical Support from 06/07/2021 in [facility]  PHQ-2 Total Score 3 2 4 4 1   PHQ-9 Total Score 7 9 14 7  --   Flowsheet Row UC from 08/10/2023 in [facility] Visit from 07/28/2023 in [facility] from 04/13/2023 in [facility] Emergency Department at [facility]  C-SSRS RISK CATEGORY No Risk No Risk No Risk    Collaboration of Care: Collaboration of Care: Medication Management AEB medication prescription and Other provider involved in patient's care AEB internal medicine, urgent care, and orthopedics chart review  Patient/Guardian was advised Release of Information must be obtained prior to any record release in order to collaborate their care with an outside provider. Patient/Guardian was advised if they have not already done so to contact the registration department to sign all necessary forms in order for us  to release information regarding their care.   Consent: Patient/Guardian gives verbal consent for treatment and assignment of benefits for services provided during this visit. Patient/Guardian expressed understanding and agreed to proceed.    Arvella CHRISTELLA Finder, MD 10/20/2023, 8:47 AM

## 2023-10-20 ENCOUNTER — Encounter (HOSPITAL_COMMUNITY): Payer: Self-pay | Admitting: Psychiatry

## 2023-10-20 ENCOUNTER — Ambulatory Visit (HOSPITAL_BASED_OUTPATIENT_CLINIC_OR_DEPARTMENT_OTHER): Admitting: Psychiatry

## 2023-10-20 DIAGNOSIS — F319 Bipolar disorder, unspecified: Secondary | ICD-10-CM

## 2023-10-20 DIAGNOSIS — F411 Generalized anxiety disorder: Secondary | ICD-10-CM | POA: Diagnosis not present

## 2023-10-20 MED ORDER — ARIPIPRAZOLE 15 MG PO TABS: 15.0000 mg | ORAL_TABLET | Freq: Every day | ORAL | 2 refills | Status: DC

## 2023-10-20 MED ORDER — TRAZODONE HCL 100 MG PO TABS: 100.0000 mg | ORAL_TABLET | Freq: Every day | ORAL | 2 refills | Status: DC

## 2023-10-20 MED ORDER — ESCITALOPRAM OXALATE 10 MG PO TABS: 10.0000 mg | ORAL_TABLET | Freq: Every day | ORAL | 2 refills | Status: DC

## 2023-10-31 ENCOUNTER — Other Ambulatory Visit: Payer: Self-pay | Admitting: Family Medicine

## 2023-10-31 DIAGNOSIS — M7501 Adhesive capsulitis of right shoulder: Secondary | ICD-10-CM

## 2023-11-03 DIAGNOSIS — E7800 Pure hypercholesterolemia, unspecified: Secondary | ICD-10-CM | POA: Diagnosis not present

## 2023-11-03 DIAGNOSIS — M5134 Other intervertebral disc degeneration, thoracic region: Secondary | ICD-10-CM | POA: Diagnosis not present

## 2023-11-03 DIAGNOSIS — Z9181 History of falling: Secondary | ICD-10-CM | POA: Diagnosis not present

## 2023-11-03 DIAGNOSIS — Z79899 Other long term (current) drug therapy: Secondary | ICD-10-CM | POA: Diagnosis not present

## 2023-11-03 DIAGNOSIS — M1712 Unilateral primary osteoarthritis, left knee: Secondary | ICD-10-CM | POA: Diagnosis not present

## 2023-11-03 DIAGNOSIS — E1169 Type 2 diabetes mellitus with other specified complication: Secondary | ICD-10-CM | POA: Diagnosis not present

## 2023-11-06 DIAGNOSIS — Z79899 Other long term (current) drug therapy: Secondary | ICD-10-CM | POA: Diagnosis not present

## 2023-11-15 ENCOUNTER — Other Ambulatory Visit: Payer: Self-pay | Admitting: Family Medicine

## 2023-11-18 ENCOUNTER — Ambulatory Visit: Attending: Family Medicine | Admitting: Family Medicine

## 2023-11-18 ENCOUNTER — Encounter: Payer: Self-pay | Admitting: Family Medicine

## 2023-11-18 VITALS — BP 127/83 | HR 65 | Temp 97.9°F | Ht 63.0 in | Wt 219.8 lb

## 2023-11-18 DIAGNOSIS — M7501 Adhesive capsulitis of right shoulder: Secondary | ICD-10-CM

## 2023-11-18 DIAGNOSIS — M7502 Adhesive capsulitis of left shoulder: Secondary | ICD-10-CM | POA: Diagnosis not present

## 2023-11-18 DIAGNOSIS — Z1231 Encounter for screening mammogram for malignant neoplasm of breast: Secondary | ICD-10-CM | POA: Diagnosis not present

## 2023-11-18 DIAGNOSIS — E1159 Type 2 diabetes mellitus with other circulatory complications: Secondary | ICD-10-CM

## 2023-11-18 DIAGNOSIS — F319 Bipolar disorder, unspecified: Secondary | ICD-10-CM

## 2023-11-18 DIAGNOSIS — I152 Hypertension secondary to endocrine disorders: Secondary | ICD-10-CM | POA: Diagnosis not present

## 2023-11-18 DIAGNOSIS — G894 Chronic pain syndrome: Secondary | ICD-10-CM

## 2023-11-18 DIAGNOSIS — Z7984 Long term (current) use of oral hypoglycemic drugs: Secondary | ICD-10-CM

## 2023-11-18 DIAGNOSIS — E1169 Type 2 diabetes mellitus with other specified complication: Secondary | ICD-10-CM

## 2023-11-18 DIAGNOSIS — E1149 Type 2 diabetes mellitus with other diabetic neurological complication: Secondary | ICD-10-CM

## 2023-11-18 LAB — POCT GLYCOSYLATED HEMOGLOBIN (HGB A1C): HbA1c, POC (controlled diabetic range): 6.3 % (ref 0.0–7.0)

## 2023-11-18 MED ORDER — LOSARTAN POTASSIUM-HCTZ 50-12.5 MG PO TABS: 1.0000 | ORAL_TABLET | Freq: Every day | ORAL | 1 refills | Status: AC

## 2023-11-18 MED ORDER — LORAZEPAM 0.5 MG PO TABS: 0.5000 mg | ORAL_TABLET | Freq: Once | ORAL | 0 refills | Status: AC

## 2023-11-18 MED ORDER — PREDNISONE 20 MG PO TABS: 20.0000 mg | ORAL_TABLET | Freq: Every day | ORAL | 0 refills | Status: DC

## 2023-11-18 MED ORDER — DAPAGLIFLOZIN PROPANEDIOL 5 MG PO TABS: 5.0000 mg | ORAL_TABLET | Freq: Every day | ORAL | 1 refills | Status: AC

## 2023-11-18 MED ORDER — ATORVASTATIN CALCIUM 10 MG PO TABS: 10.0000 mg | ORAL_TABLET | Freq: Every day | ORAL | 1 refills | Status: AC

## 2023-11-18 MED ORDER — GABAPENTIN 300 MG PO CAPS: 300.0000 mg | ORAL_CAPSULE | Freq: Three times a day (TID) | ORAL | 1 refills | Status: DC

## 2023-11-18 NOTE — Progress Notes (Signed)
 Subjective:  Patient ID: Sydney Harris, female    DOB: 06/29/1965  Age: 58 y.o. MRN: [id number]  CC: Medical Management of Chronic Issues (Bilateral shoulder pain/Need medication to be able to take MRI/Requesting prednisone )     Discussed the use of AI scribe software for clinical note transcription with the patient, who gave verbal consent to proceed.  History of Present Illness Sydney Harris is a 58 year old female  with  type 2 diabetes mellitus, hypertension bipolar disorder, schizophrenia, previous substance (cocaine) abuse chronic pain, adhesive capsulitis who presents for medication management and MRI preparation due to shoulder pain.  She experiences shoulder pain and requires medication to manage anxiety before undergoing an MRI. She takes gabapentin  for pain management and baclofen  as a muscle relaxant, though baclofen  is ineffective.  She request a prescription for prednisone  for her pain.  She is currently being followed by orthopedic.  She is seeking a new pain clinic due to issues with her current one and is concerned about running out of her pain medication, which she takes four times a day.  Her diabetes, hypertension, and cholesterol are managed with Farxiga , losartan /hydrochlorothiazide , and atorvastatin .  A1c is controlled at 6.3.  Schizophrenia and bipolar disorder are managed by her psychiatrist, Dr. Carvin, and she receives trazodone  from another source.    Past Medical History:  Diagnosis Date   Anxiety    Arthritis of left knee    Bipolar disorder (HCC)    Blood transfusion 1981   related to my daughter being born   Depression    Diabetes mellitus without complication (HCC)    Fibroid tumor    Gout    Hypertension    Plantar fasciitis    Substance abuse (HCC)    crack cocaine  14 yrs sober.    Past Surgical History:  Procedure Laterality Date   ABDOMINAL HYSTERECTOMY     for fibroid tumors    Family History  Problem Relation Age  of Onset   Diabetes Father    Asthma Daughter    Heart failure Neg Hx    Cancer Neg Hx    Colon polyps Neg Hx    Colon cancer Neg Hx    Esophageal cancer Neg Hx    Rectal cancer Neg Hx    Stomach cancer Neg Hx     Social History   Socioeconomic History   Marital status: Single    Spouse name: Not on file   Number of children: Not on file   Years of education: Not on file   Highest education level: 9th grade  Occupational History   Not on file  Tobacco Use   Smoking status: Former    Current packs/day: 0.00    Average packs/day: 0.5 packs/day for 33.0 years (16.5 ttl pk-yrs)    Types: Cigarettes    Start date: 3    Quit date: 2018    Years since quitting: 7.8   Smokeless tobacco: Never  Vaping Use   Vaping status: Never Used  Substance and Sexual Activity   Alcohol  use: No   Drug use: Not Currently    Types: Crack cocaine, Marijuana    Comment: clean since 2015; currently chews CBD gummies   Sexual activity: Yes    Partners: Male    Birth control/protection: None  Other Topics Concern   Not on file  Social History Narrative   Not on file   Social Drivers of Health   Financial Resource Strain: Low Risk  (  09/10/2022)   Overall Financial Resource Strain (CARDIA)    Difficulty of Paying Living Expenses: Not hard at all  Food Insecurity: Patient Declined (12/06/2022)   Hunger Vital Sign    Worried About Running Out of Food in the Last Year: Patient declined    Ran Out of Food in the Last Year: Patient declined  Transportation Needs: Patient Declined (12/06/2022)   PRAPARE - Administrator, Civil Service (Medical): Patient declined    Lack of Transportation (Non-Medical): Patient declined  Physical Activity: Inactive (09/10/2022)   Exercise Vital Sign    Days of Exercise per Week: 0 days    Minutes of Exercise per Session: 0 min  Stress: No Stress Concern Present (09/10/2022)   [employer] of Occupational Health - Occupational Stress  Questionnaire    Feeling of Stress : Not at all  Social Connections: Moderately Isolated (09/10/2022)   Social Connection and Isolation Panel    Frequency of Communication with Friends and Family: Twice a week    Frequency of Social Gatherings with Friends and Family: Three times a week    Attends Religious Services: More than 4 times per year    Active Member of Clubs or Organizations: No    Attends Banker Meetings: Never    Marital Status: Never married    Allergies  Allergen Reactions   Penicillins Hives    Has patient had a PCN reaction causing immediate rash, facial/tongue/throat swelling, SOB or lightheadedness with hypotension: Yes Has patient had a PCN reaction causing severe rash involving mucus membranes or skin necrosis: Yes Has patient had a PCN reaction that required hospitalization No Has patient had a PCN reaction occurring within the last 10 years: Yes If all of the above answers are NO, then may proceed with Cephalosporin use.    Tomato     hives    Outpatient Medications Prior to Visit  Medication Sig Dispense Refill   ARIPiprazole  (ABILIFY ) 15 MG tablet Take 1 tablet (15 mg total) by mouth at bedtime. 30 tablet 2   baclofen  (LIORESAL ) 10 MG tablet TAKE 1 TABLET BY MOUTH THREE TIMES DAILY 90 tablet 0   benztropine  (COGENTIN ) 0.5 MG tablet Take 1 tablet (0.5 mg total) by mouth at bedtime. 30 tablet 4   Blood Glucose Monitoring Suppl (GNP TRUE METRIX AIR METER) w/Device KIT Use to test blood sugar 3 times a day 1 kit 0   escitalopram  (LEXAPRO ) 10 MG tablet Take 1 tablet (10 mg total) by mouth daily. 30 tablet 2   glucose blood (GNP TRUE METRIX GLUCOSE STRIPS) test strip Use as instructed 100 each 12   latanoprost (XALATAN) 0.005 % ophthalmic solution Place 1 drop into both eyes at bedtime.     Misc. Devices [employer] Dagnosis- left knee pain 1 each 0   Multiple Vitamin (MULTIVITAMIN ADULT PO) Take by mouth daily.     Multiple Vitamins-Minerals (HAIR  SKIN AND NAILS FORMULA PO) Take by mouth daily.     naproxen  (NAPROSYN ) 500 MG tablet Take 1 tablet (500 mg total) by mouth 2 (two) times daily with a meal. 30 tablet 3   oxyCODONE -acetaminophen  (PERCOCET) 10-325 MG tablet Take 1 tablet by mouth 4 (four) times daily.     timolol (TIMOPTIC) 0.5 % ophthalmic solution 1 drop every morning.     traZODone  (DESYREL ) 100 MG tablet Take 1 tablet (100 mg total) by mouth at bedtime. 30 tablet 2   TRUEplus Lancets 33G MISC Use to test blood sugar  3 times a day 100 each 12   Vitamin D , Ergocalciferol , (DRISDOL ) 1.25 MG (50000 UNIT) CAPS capsule Take 1 capsule (50,000 Units total) by mouth once a week. 12 capsule 4   atorvastatin  (LIPITOR) 10 MG tablet Take 1 tablet by mouth once daily 90 tablet 0   FARXIGA  5 MG TABS tablet TAKE 1 TABLET BY MOUTH ONCE DAILY BEFORE BREAKFAST DISCONTINUE  METFORMIN  90 tablet 0   gabapentin  (NEURONTIN ) 300 MG capsule Take 1 capsule (300 mg total) by mouth 3 (three) times daily. 90 capsule 1   losartan -hydrochlorothiazide  (HYZAAR ) 50-12.5 MG tablet Take 1 tablet by mouth daily. 90 tablet 1   predniSONE  (DELTASONE ) 20 MG tablet 3 tabs daily x3 days, then 2 tabs daily x3 days, then 1 tab daily x3 days, then one half tab daily x3 days, then stop 20 tablet 0   metFORMIN  (GLUCOPHAGE ) 500 MG tablet Take 500 mg by mouth every morning. (Patient not taking: Reported on 11/18/2023)     naloxone (NARCAN) nasal spray 4 mg/0.1 mL SMARTSIG:Both Nares (Patient not taking: Reported on 11/18/2023)     No facility-administered medications prior to visit.     ROS Review of Systems  Constitutional:  Negative for activity change and appetite change.  HENT:  Negative for sinus pressure and sore throat.   Respiratory:  Negative for chest tightness, shortness of breath and wheezing.   Cardiovascular:  Negative for chest pain and palpitations.  Gastrointestinal:  Negative for abdominal distention, abdominal pain and constipation.  Genitourinary:  Negative.   Musculoskeletal:        See HPI  Psychiatric/Behavioral:  Negative for behavioral problems and dysphoric mood.     Objective:  BP 127/83   Pulse 65   Temp 97.9 F (36.6 C) (Oral)   Ht 5' 3 (1.6 m)   Wt 219 lb 12.8 oz (99.7 kg)   LMP 12/28/2010   SpO2 100%   BMI 38.94 kg/m      11/18/2023    9:49 AM 10/20/2023    8:46 AM 08/18/2023    2:03 PM  BP/Weight  Systolic BP 127  896  Diastolic BP 83  67  Wt. (Lbs) 219.8  201.6  BMI 38.94 kg/m2  35.71 kg/m2     Information is confidential and restricted. Go to Review Flowsheets to unlock data.      Physical Exam Constitutional:      Appearance: She is well-developed.  Cardiovascular:     Rate and Rhythm: Normal rate.     Heart sounds: Normal heart sounds. No murmur heard. Pulmonary:     Effort: Pulmonary effort is normal.     Breath sounds: Normal breath sounds. No wheezing or rales.  Chest:     Chest wall: No tenderness.  Abdominal:     General: Bowel sounds are normal. There is no distension.     Palpations: Abdomen is soft. There is no mass.     Tenderness: There is no abdominal tenderness.  Musculoskeletal:     Right lower leg: No edema.     Left lower leg: No edema.     Comments: Restricted ROM in bilateral UE   Neurological:     Mental Status: She is alert and oriented to person, place, and time.  Psychiatric:        Mood and Affect: Mood normal.        Latest Ref Rng & Units 05/05/2023    4:20 PM 04/13/2023    6:19 PM 11/19/2022    9:12  AM  CMP  Glucose 70 - 99 mg/dL  92  897   BUN 6 - 20 mg/dL  21  16   Creatinine 9.55 - 1.00 mg/dL  9.16  9.15   Sodium 864 - 145 mmol/L  139  144   Potassium 3.5 - 5.2 mmol/L 4.2  3.3  4.6   Chloride 98 - 111 mmol/L  104  104   CO2 22 - 32 mmol/L  23  24   Calcium  8.9 - 10.3 mg/dL  89.9  89.5   Total Protein 6.5 - 8.1 g/dL  8.4    Total Bilirubin 0.0 - 1.2 mg/dL  0.6    Alkaline Phos 38 - 126 U/L  60    AST 15 - 41 U/L  21    ALT 0 - 44 U/L  18       Lipid Panel     Component Value Date/Time   CHOL 189 07/03/2022 1707   TRIG 86 07/03/2022 1707   HDL 74 07/03/2022 1707   CHOLHDL 2.6 07/03/2022 1707   LDLCALC 100 (H) 07/03/2022 1707    CBC    Component Value Date/Time   WBC 13.4 (H) 04/13/2023 1819   RBC 5.21 (H) 04/13/2023 1819   HGB 14.6 04/13/2023 1819   HGB 13.4 10/02/2021 1042   HCT 46.3 (H) 04/13/2023 1819   HCT 40.5 10/02/2021 1042   PLT 325 04/13/2023 1819   PLT 426 10/02/2021 1042   MCV 88.9 04/13/2023 1819   MCV 87 10/02/2021 1042   MCH 28.0 04/13/2023 1819   MCHC 31.5 04/13/2023 1819   RDW 14.0 04/13/2023 1819   RDW 12.3 10/02/2021 1042   LYMPHSABS 1.9 04/13/2023 1819   LYMPHSABS 3.7 (H) 10/02/2021 1042   MONOABS 0.8 04/13/2023 1819   EOSABS 0.0 04/13/2023 1819   EOSABS 0.2 10/02/2021 1042   BASOSABS 0.1 04/13/2023 1819   BASOSABS 0.1 10/02/2021 1042    Lab Results  Component Value Date   HGBA1C 6.3 11/18/2023   Lab Results  Component Value Date   HGBA1C 6.3 11/18/2023   HGBA1C 6.2 05/05/2023   HGBA1C 6.3 11/19/2022        Assessment & Plan Adhesive capsulitis of shoulders Bilateral adhesive capsulitis. - Prescribed Ativan 30 minutes before MRI for anxiety. - Prescribed prednisone  for 5 days for shoulder pain. - Keep follow-up appointment with orthopedics  Chronic pain Chronic pain management complicated by previous substance exposure and current oxycodone -acetaminophen  use. - Advised reducing oxycodone -acetaminophen  to twice daily to prevent running out of as I have informed her that we do not prescribe oxycodone /acetaminophen  in this clinic - Referred to new pain clinic for ongoing management.  Type 2 diabetes mellitus Well-controlled with A1c of 6.3. - Continue current diabetes medications including Farxiga . -Counseled on Diabetic diet, the healthy plate, 849 minutes of moderate intensity exercise/week Blood sugar logs with fasting goals of 80-120 mg/dl, random of less than 819  and in the event of sugars less than 60 mg/dl or greater than 599 mg/dl encouraged to notify the clinic. Advised on the need for annual eye exams, annual foot exams, Pneumonia vaccine.   Hypertension associated with type 2 diabetes mellitus Requires medication management. - Refilled losartan -hydrochlorothiazide  prescription.  Hyperlipidemia associated with type 2 diabetes mellitus Controlled Requires ongoing medication management. - Refilled atorvastatin  prescription.   Bipolar 1 disorder - Stable - Management per psych  General Health Maintenance Routine health maintenance and screenings due. - Referred to eye doctor for exam. - Ordered mammogram for  breast cancer screening.      Meds ordered this encounter  Medications   LORazepam (ATIVAN) 0.5 MG tablet    Sig: Take 1 tablet (0.5 mg total) by mouth once for 1 dose. 30 minutes before MRI    Dispense:  1 tablet    Refill:  0   predniSONE  (DELTASONE ) 20 MG tablet    Sig: Take 1 tablet (20 mg total) by mouth daily with breakfast.    Dispense:  5 tablet    Refill:  0   atorvastatin  (LIPITOR) 10 MG tablet    Sig: Take 1 tablet (10 mg total) by mouth daily.    Dispense:  90 tablet    Refill:  1   dapagliflozin  propanediol (FARXIGA ) 5 MG TABS tablet    Sig: Take 1 tablet (5 mg total) by mouth daily.    Dispense:  90 tablet    Refill:  1   gabapentin  (NEURONTIN ) 300 MG capsule    Sig: Take 1 capsule (300 mg total) by mouth 3 (three) times daily.    Dispense:  90 capsule    Refill:  1   losartan -hydrochlorothiazide  (HYZAAR ) 50-12.5 MG tablet    Sig: Take 1 tablet by mouth daily.    Dispense:  90 tablet    Refill:  1    Follow-up: Return in about 6 months (around 05/18/2024) for Chronic medical conditions.       Corrina Sabin, MD, FAAFP. [facility] [place], [place] [phone]   11/18/2023, 1:14 PM

## 2023-11-18 NOTE — Patient Instructions (Signed)
 VISIT SUMMARY:  Today, you were seen for medication management and preparation for an MRI due to shoulder pain. We discussed your current medications and made some adjustments to better manage your conditions. You were also referred to specialists for further evaluation and treatment.  YOUR PLAN:  -ADHESIVE CAPSULITIS OF SHOULDERS: Adhesive capsulitis, also known as frozen shoulder, is a condition characterized by stiffness and pain in your shoulder joint. You were prescribed Ativan to take 30 minutes before your MRI to help with anxiety and prednisone  for 5 days to help with shoulder pain. You were also referred to orthopedics for an MRI to further evaluate your condition.  -CHRONIC PAIN MANAGEMENT: Chronic pain management involves ongoing treatment to help manage long-term pain. Due to previous substance exposure and current use of oxycodone -acetaminophen , you were advised to reduce your intake to twice daily. You were also referred to a new pain clinic for continued management of your pain.  -TYPE 2 DIABETES MELLITUS: Type 2 diabetes is a condition that affects the way your body processes blood sugar. Your diabetes is well-controlled with an A1c of 6.3, so you should continue taking your current diabetes medications, including Farxiga .  -ESSENTIAL HYPERTENSION: Essential hypertension is high blood pressure with no identifiable cause. Your prescription for losartan -hydrochlorothiazide  was refilled to help manage your blood pressure.  -HYPERLIPIDEMIA: Hyperlipidemia is a condition where there are high levels of fats (lipids) in your blood. Your prescription for atorvastatin  was refilled to help manage your cholesterol levels.  -GENERAL HEALTH MAINTENANCE: Routine health maintenance is important for early detection and prevention of diseases. You were referred to an eye doctor for an exam and a mammogram was ordered for breast cancer screening.  INSTRUCTIONS:  Please follow up with the  orthopedics for your MRI and the new pain clinic for ongoing pain management. Make sure to schedule an appointment with the eye doctor and get your mammogram done as ordered. Continue taking your medications as prescribed and reduce your oxycodone -acetaminophen  intake to twice daily.

## 2023-11-19 ENCOUNTER — Ambulatory Visit: Payer: Self-pay | Admitting: Family Medicine

## 2023-11-19 LAB — CMP14+EGFR
ALT: 15 IU/L (ref 0–32)
AST: 15 IU/L (ref 0–40)
Albumin: 4.6 g/dL (ref 3.8–4.9)
Alkaline Phosphatase: 69 IU/L (ref 49–135)
BUN/Creatinine Ratio: 27 (ref 12–28)
BUN: 24 mg/dL (ref 8–27)
Bilirubin Total: 0.3 mg/dL (ref 0.0–1.2)
CO2: 25 mmol/L (ref 20–29)
Calcium: 10.4 mg/dL — ABNORMAL HIGH (ref 8.7–10.3)
Chloride: 105 mmol/L (ref 96–106)
Creatinine, Ser: 0.88 mg/dL (ref 0.57–1.00)
Globulin, Total: 2.6 g/dL (ref 1.5–4.5)
Glucose: 104 mg/dL — ABNORMAL HIGH (ref 70–99)
Potassium: 4.5 mmol/L (ref 3.5–5.2)
Sodium: 142 mmol/L (ref 134–144)
Total Protein: 7.2 g/dL (ref 6.0–8.5)
eGFR: 75 mL/min/1.73 (ref >59)

## 2023-11-23 ENCOUNTER — Ambulatory Visit: Admitting: Family Medicine

## 2023-11-24 ENCOUNTER — Other Ambulatory Visit: Payer: Self-pay | Admitting: Critical Care Medicine

## 2023-11-27 ENCOUNTER — Other Ambulatory Visit: Payer: Self-pay | Admitting: Family Medicine

## 2023-11-27 MED ORDER — PREDNISONE 20 MG PO TABS: 20.0000 mg | ORAL_TABLET | Freq: Every day | ORAL | 0 refills | Status: DC

## 2023-11-27 NOTE — Telephone Encounter (Signed)
 Copied from CRM #[id number]. Topic: Clinical - Medication Refill >> Nov 27, 2023 12:44 PM Sydney Harris wrote: Medication: predniSONE  (DELTASONE ) 20 MG tablet  Has the patient contacted their pharmacy? No (Agent: If no, request that the patient contact the pharmacy for the refill. If patient does not wish to contact the pharmacy document the reason why and proceed with request.) (Agent: If yes, when and what did the pharmacy advise?)  This is the patient's preferred pharmacy:  [facility] [address] [place], [place] - [address] [place] [address] Phone: [phone] Fax: [phone]   Is this the correct pharmacy for this prescription? Yes If no, delete pharmacy and type the correct one.   Has the prescription been filled recently? Yes  Is the patient out of the medication? Yes  Has the patient been seen for an appointment in the last year OR does the patient have an upcoming appointment? Yes  Can we respond through MyChart? No  Agent: Please be advised that Rx refills may take up to 3 business days. We ask that you follow-up with your pharmacy.

## 2023-11-30 ENCOUNTER — Other Ambulatory Visit: Payer: Self-pay | Admitting: Family Medicine

## 2023-11-30 DIAGNOSIS — M7501 Adhesive capsulitis of right shoulder: Secondary | ICD-10-CM

## 2023-12-01 ENCOUNTER — Ambulatory Visit

## 2023-12-01 NOTE — Telephone Encounter (Signed)
 Requested Prescriptions  Pending Prescriptions Disp Refills   baclofen  (LIORESAL ) 10 MG tablet [Pharmacy Med Name: Baclofen  10 MG Oral Tablet] 90 tablet 2    Sig: TAKE 1 TABLET BY MOUTH THREE TIMES DAILY     Analgesics:  Muscle Relaxants - baclofen  Passed - 12/01/2023  2:26 PM      Passed - Cr in normal range and within 180 days    Creatinine  Date Value Ref Range Status  03/28/2020 179.2 20.0 - 300.0 mg/dL Final   Creatinine, Ser  Date Value Ref Range Status  11/18/2023 0.88 0.57 - 1.00 mg/dL Final         Passed - eGFR is 30 or above and within 180 days    GFR calc Af Amer  Date Value Ref Range Status  12/21/2019 77 >59 mL/min/1.73 Final    Comment:    **In accordance with recommendations from the NKF-ASN Task force,**   [facility] is in the process of updating its eGFR calculation to the   2021 CKD-EPI creatinine equation that estimates kidney function   without a race variable.    GFR, Estimated  Date Value Ref Range Status  04/13/2023 >60 >60 mL/min Final    Comment:    (NOTE) Calculated using the CKD-EPI Creatinine Equation (2021)    eGFR  Date Value Ref Range Status  11/18/2023 75 >59 mL/min/1.73 Final         Passed - Valid encounter within last 6 months    Recent Outpatient Visits           1 week ago Type 2 diabetes mellitus with other specified complication, without long-term current use of insulin (HCC)   [facility] [place]. [facility] Delbert Clam, MD   3 months ago Adhesive capsulitis of both shoulders   [facility] [place]. [facility] Delbert Clam, MD   6 months ago Type 2 diabetes mellitus with other specified complication, without long-term current use of insulin (HCC)   [facility] [place]. [facility] Delbert Clam, MD   7 months ago Type 2 diabetes mellitus with other specified complication, without long-term  current use of insulin (HCC)   [facility] [place]. [facility] Delbert Clam, MD   1 year ago Type 2 diabetes mellitus with hyperglycemia, without long-term current use of insulin (HCC)   [facility] [place]. [facility] Delbert Clam, MD

## 2023-12-02 ENCOUNTER — Ambulatory Visit: Payer: Self-pay

## 2023-12-02 ENCOUNTER — Telehealth: Payer: Self-pay | Admitting: Family Medicine

## 2023-12-02 NOTE — Telephone Encounter (Signed)
 FYI Only or Action Required?: FYI only for provider: appointment scheduled on 12/22/2023.  Patient was last seen in primary care on 11/18/2023 by Newlin, Enobong, MD.  Called Nurse Triage reporting Joint Pain.  Symptoms began yesterday.  Interventions attempted: Prescription medications: steroid.  Symptoms are: unchanged.  Triage Disposition: See HCP Within 4 Hours (Or PCP Triage)  Patient/caregiver understands and will follow disposition?: Yes  Copied from CRM [id number]. Topic: Clinical - Red Word Triage >> Dec 02, 2023  9:45 AM Lonell PEDLAR wrote: Red Word that prompted transfer to Nurse Triage: Patient called with arthritis pain, knees, leg, shoulders, foot, lower back Reason for Disposition  [1] SEVERE pain (e.g., excruciating, unable to do any normal activities) AND [2] not improved after 2 hours of pain medicine  Answer Assessment - Initial Assessment Questions Patient dismissed from pain mgmt practice. Out of pain medication.  Recommended ED for pain medication today.  Will f/u with PCP on 12/22/2023  1. ONSET: When did the pain start?      Patient has been out of pain medication for two days 2. LOCATION: Where is the pain located?      Worse at legs and feet, but also has low back and shoulder pain 3. PAIN: How bad is the pain?    (Scale 1-10; or mild, moderate, severe)     10/10 4. WORK OR EXERCISE: Has there been any recent work or exercise that involved this part of the body?      denies 5. CAUSE: What do you think is causing the leg pain?     Arthritis, oxycodone  10/325, 1 tab PO 4x day 6. OTHER SYMPTOMS: Do you have any other symptoms? (e.g., chest pain, back pain, breathing difficulty, swelling, rash, fever, numbness, weakness)     denies  Protocols used: Leg Pain-A-AH

## 2023-12-02 NOTE — Telephone Encounter (Signed)
 Left message on voicemail to return call.

## 2023-12-02 NOTE — Telephone Encounter (Signed)
 Please see the plan in my notes at her last visit where we discussed this.  We do not just prescribe Percocet here.  I already sent a prescription for gabapentin  to her pharmacy at her last visit.

## 2023-12-02 NOTE — Telephone Encounter (Signed)
 Sent previous message/ encounter to PCP.

## 2023-12-02 NOTE — Telephone Encounter (Signed)
 Copied from CRM [id number]. Topic: Clinical - Medical Advice >> Dec 02, 2023  9:43 AM Lonell PEDLAR wrote:  Reason for CRM: Patient was discharged from pain management practice, she is requesting that her pcp refill her pain medications for arthritis. C/b: [phone]   oxyCODONE -acetaminophen  (PERCOCET) 10-325 MG tablet gabapentin  (NEURONTIN ) 300 MG capsule

## 2023-12-11 ENCOUNTER — Telehealth: Payer: Self-pay

## 2023-12-11 NOTE — Telephone Encounter (Signed)
 Patient's demo and insurance information has been faxed to number listed.

## 2023-12-11 NOTE — Telephone Encounter (Signed)
 Copied from CRM [id number]. Topic: Referral - Question >> Dec 08, 2023 11:35 AM Selinda RAMAN wrote: Reason for CRM: [facility] called in stating they did not receive insurance or demographic information on the patient. Please fax this information to [phone] and if there are any questions please call [phone] and ask for [facility] Please assist patient further.

## 2023-12-22 ENCOUNTER — Ambulatory Visit: Payer: Self-pay | Admitting: Nurse Practitioner

## 2023-12-22 ENCOUNTER — Ambulatory Visit: Payer: Self-pay

## 2023-12-22 NOTE — Telephone Encounter (Signed)
 Noted

## 2023-12-22 NOTE — Telephone Encounter (Signed)
 FYI Only or Action Required?: FYI only for provider: appointment scheduled on 12/23/23.  Patient was last seen in primary care on 11/18/2023 by Newlin, Enobong, MD.  Called Nurse Triage reporting Joint Swelling.  Symptoms began several days ago.  Interventions attempted: Nothing.  Symptoms are: unchanged.  Triage Disposition: See PCP When Office is Open (Within 3 Days)  Patient/caregiver understands and will follow disposition?: Yes   Copied from CRM #[id number]. Topic: Clinical - Red Word Triage >> Dec 22, 2023 11:40 AM Sydney Harris wrote: Red Word that prompted transfer to Nurse Triage: Pt called in to reschedule appointment due to swollen leg due to gout. Reason for Disposition  MILD or MODERATE swelling (e.g., can't move joint normally, can't do usual activities) (Exceptions: Itchy, localized swelling; swelling is chronic.)  Answer Assessment - Initial Assessment Questions Patient called to reschedule appt; appt already cancelled. NT rescheduled appt 12/23/23.  Advised call back or ED if symptoms worsen. Pt verbalized understanding.  1. LOCATION: Where is the swelling located?  (e.g., left, right, both knees)     Arthritis of knee 2. ONSET: When did the swelling start? Does it come and go, or is it there all the time?     yesterday 3. SWELLING: How bad is the swelling? Or, How large is it? (e.g., mild, moderate, severe; size of localized swelling)      Mild; only knee 4. PAIN: Is there any pain? If Yes, ask: How bad is it? (Scale 0-10; or none, mild, moderate, severe)     8/10; no meds 5. SETTING: Has there been any recent work, exercise or other activity that involved that part of the body?      no 6. AGGRAVATING FACTORS: What makes the knee swelling worse? (e.g., walking, climbing stairs, running)     Walking 7. ASSOCIATED SYMPTOMS: Is there any pain or redness?     denies 8. OTHER SYMPTOMS: Do you have any other symptoms? (e.g., calf pain, chest pain,  difficulty breathing, fever)  Denies diff breating/ sob, chest pain, fever, chills, n/v  Protocols used: Knee Swelling-A-AH

## 2023-12-23 ENCOUNTER — Ambulatory Visit: Attending: Nurse Practitioner | Admitting: Nurse Practitioner

## 2023-12-23 ENCOUNTER — Encounter: Payer: Self-pay | Admitting: Nurse Practitioner

## 2023-12-23 VITALS — BP 129/79 | HR 60 | Resp 19 | Ht 63.0 in | Wt 216.8 lb

## 2023-12-23 DIAGNOSIS — M2550 Pain in unspecified joint: Secondary | ICD-10-CM

## 2023-12-23 MED ORDER — MELOXICAM 15 MG PO TABS: 15.0000 mg | ORAL_TABLET | Freq: Every day | ORAL | 0 refills | Status: DC

## 2023-12-23 MED ORDER — DICLOFENAC SODIUM 1 % EX GEL: 4.0000 g | Freq: Four times a day (QID) | CUTANEOUS | 0 refills | Status: AC

## 2023-12-23 MED ORDER — GABAPENTIN 300 MG PO CAPS: 300.0000 mg | ORAL_CAPSULE | Freq: Three times a day (TID) | ORAL | 1 refills | Status: AC

## 2023-12-23 NOTE — Patient Instructions (Signed)
 [facility] [place] [phone] [address] [place] [address] [phone])

## 2023-12-23 NOTE — Progress Notes (Signed)
 Assessment & Plan:  Jorja was seen today for hip pain and knee pain.  Diagnoses and all orders for this visit:  Arthralgia of multiple joints -     gabapentin  (NEURONTIN ) 300 MG capsule; Take 1 capsule (300 mg total) by mouth 3 (three) times daily. -     meloxicam  (MOBIC ) 15 MG tablet; Take 1 tablet (15 mg total) by mouth daily. -     diclofenac Sodium (VOLTAREN) 1 % GEL; Apply 4 g topically 4 (four) times daily. Chronic severe left knee pain from osteoarthritis, inadequately managed with current medications. Gabapentin  provides limited relief. Surgery considered but patient hesitant due to recovery concerns and prediabetes. Pain management referral pending. - Refilled gabapentin  300 mg TID. - Prescribed meloxicam  15 mg daily. - Provided pain management clinic contact. - Prescribed Voltaren gel for topical use.    Patient has been counseled on age-appropriate routine health concerns for screening and prevention. These are reviewed and up-to-date. Referrals have been placed accordingly. Immunizations are up-to-date or declined.    Subjective:   Chief Complaint  Patient presents with   Hip Pain   Knee Pain   History of Present Illness Sydney Harris is a 58 year old female who presents with knee pain and requests medication refills.  She is a patient of Dr. Delbert  PMH: DM2, hypertension bipolar disorder, schizophrenia, previous substance (cocaine) abuse chronic pain, adhesive capsulitis, chronic joint pain.  She has been experiencing ongoing knee pain and is also being followed by Ortho for chronic bilateral shoulder pain. She was referred by her PCP to pain mgmt.and was going to the [facility] clinic just earlier this year but requested a new referral to a different clinic a few months ago.  States she has not been contacted as of today. I have given her the information and placed on her AVS to call for an appointment. She is requesting refills of gabapentin  and meloxicam  but  is also requesting prednisone  today which was filled by her PCP only last month.   Schizophrenia and bipolar disorder are managed by her psychiatrist, Dr. Carvin, and she receives trazodone  from another source.     Review of Systems  Constitutional:  Negative for fever, malaise/fatigue and weight loss.  HENT: Negative.  Negative for nosebleeds.   Eyes: Negative.  Negative for blurred vision, double vision and photophobia.  Respiratory: Negative.  Negative for cough and shortness of breath.   Cardiovascular: Negative.  Negative for chest pain, palpitations and leg swelling.  Gastrointestinal: Negative.  Negative for heartburn, nausea and vomiting.  Musculoskeletal:  Positive for back pain and joint pain. Negative for myalgias.  Neurological: Negative.  Negative for dizziness, focal weakness, seizures and headaches.  Psychiatric/Behavioral: Negative.  Negative for suicidal ideas.     Past Medical History:  Diagnosis Date   Anxiety    Arthritis of left knee    Bipolar disorder (HCC)    Blood transfusion 1981   related to my daughter being born   Depression    Diabetes mellitus without complication (HCC)    Fibroid tumor    Gout    Hypertension    Plantar fasciitis    Substance abuse (HCC)    crack cocaine  14 yrs sober.    Past Surgical History:  Procedure Laterality Date   ABDOMINAL HYSTERECTOMY     for fibroid tumors    Family History  Problem Relation Age of Onset   Diabetes Father    Asthma Daughter    Heart  failure Neg Hx    Cancer Neg Hx    Colon polyps Neg Hx    Colon cancer Neg Hx    Esophageal cancer Neg Hx    Rectal cancer Neg Hx    Stomach cancer Neg Hx     Social History Reviewed with no changes to be made today.   Outpatient Medications Prior to Visit  Medication Sig Dispense Refill   ARIPiprazole  (ABILIFY ) 15 MG tablet Take 1 tablet (15 mg total) by mouth at bedtime. 30 tablet 2   atorvastatin  (LIPITOR) 10 MG tablet Take 1 tablet (10 mg  total) by mouth daily. 90 tablet 1   baclofen  (LIORESAL ) 10 MG tablet TAKE 1 TABLET BY MOUTH THREE TIMES DAILY 90 tablet 2   benztropine  (COGENTIN ) 0.5 MG tablet Take 1 tablet (0.5 mg total) by mouth at bedtime. 30 tablet 4   Blood Glucose Monitoring Suppl (GNP TRUE METRIX AIR METER) w/Device KIT Use to test blood sugar 3 times a day 1 kit 0   dapagliflozin  propanediol (FARXIGA ) 5 MG TABS tablet Take 1 tablet (5 mg total) by mouth daily. 90 tablet 1   escitalopram  (LEXAPRO ) 10 MG tablet Take 1 tablet (10 mg total) by mouth daily. 30 tablet 2   glucose blood (GNP TRUE METRIX GLUCOSE STRIPS) test strip Use as instructed 100 each 12   latanoprost (XALATAN) 0.005 % ophthalmic solution Place 1 drop into both eyes at bedtime.     losartan -hydrochlorothiazide  (HYZAAR ) 50-12.5 MG tablet Take 1 tablet by mouth daily. 90 tablet 1   Misc. Devices [employer] Dagnosis- left knee pain 1 each 0   Multiple Vitamin (MULTIVITAMIN ADULT PO) Take by mouth daily.     Multiple Vitamins-Minerals (HAIR SKIN AND NAILS FORMULA PO) Take by mouth daily.     timolol (TIMOPTIC) 0.5 % ophthalmic solution 1 drop every morning.     traZODone  (DESYREL ) 100 MG tablet Take 1 tablet (100 mg total) by mouth at bedtime. 30 tablet 2   TRUEplus Lancets 33G MISC Use to test blood sugar 3 times a day 100 each 12   Vitamin D , Ergocalciferol , (DRISDOL ) 1.25 MG (50000 UNIT) CAPS capsule Take 1 capsule (50,000 Units total) by mouth once a week. 12 capsule 4   gabapentin  (NEURONTIN ) 300 MG capsule Take 1 capsule (300 mg total) by mouth 3 (three) times daily. 90 capsule 1   metFORMIN  (GLUCOPHAGE ) 500 MG tablet Take 500 mg by mouth every morning. (Patient not taking: Reported on 12/23/2023)     naloxone [facility]) nasal spray 4 mg/0.1 mL SMARTSIG:Both Nares (Patient not taking: Reported on 12/23/2023)     oxyCODONE -acetaminophen  (PERCOCET) 10-325 MG tablet Take 1 tablet by mouth 4 (four) times daily. (Patient not taking: Reported on 12/23/2023)      naproxen  (NAPROSYN ) 500 MG tablet Take 1 tablet (500 mg total) by mouth 2 (two) times daily with a meal. (Patient not taking: Reported on 12/23/2023) 30 tablet 3   predniSONE  (DELTASONE ) 20 MG tablet Take 1 tablet (20 mg total) by mouth daily with breakfast. (Patient not taking: Reported on 12/23/2023) 5 tablet 0   No facility-administered medications prior to visit.    Allergies  Allergen Reactions   Penicillins Hives    Has patient had a PCN reaction causing immediate rash, facial/tongue/throat swelling, SOB or lightheadedness with hypotension: Yes Has patient had a PCN reaction causing severe rash involving mucus membranes or skin necrosis: Yes Has patient had a PCN reaction that required hospitalization No Has patient had a PCN  reaction occurring within the last 10 years: Yes If all of the above answers are NO, then may proceed with Cephalosporin use.    Tomato     hives       Objective:    BP 129/79 (BP Location: Left Arm, Patient Position: Sitting, Cuff Size: Large)   Pulse 60   Resp 19   Ht 5' 3 (1.6 m)   Wt 216 lb 12.8 oz (98.3 kg)   LMP 12/28/2010   SpO2 100%   BMI 38.40 kg/m  Wt Readings from Last 3 Encounters:  12/23/23 216 lb 12.8 oz (98.3 kg)  11/18/23 219 lb 12.8 oz (99.7 kg)  08/18/23 201 lb 9.6 oz (91.4 kg)    Physical Exam Vitals and nursing note reviewed.  Constitutional:      Appearance: She is well-developed.  HENT:     Head: Normocephalic and atraumatic.  Cardiovascular:     Rate and Rhythm: Normal rate and regular rhythm.     Heart sounds: Normal heart sounds. No murmur heard.    No friction rub. No gallop.  Pulmonary:     Effort: Pulmonary effort is normal. No tachypnea or respiratory distress.     Breath sounds: Normal breath sounds. No decreased breath sounds, wheezing, rhonchi or rales.  Chest:     Chest wall: No tenderness.  Musculoskeletal:        General: Normal range of motion.     Cervical back: Normal range of motion.  Skin:     General: Skin is warm and dry.  Neurological:     Mental Status: She is alert and oriented to person, place, and time.     Coordination: Coordination normal.  Psychiatric:        Behavior: Behavior normal. Behavior is cooperative.        Thought Content: Thought content normal.        Judgment: Judgment normal.          Patient has been counseled extensively about nutrition and exercise as well as the importance of adherence with medications and regular follow-up. The patient was given clear instructions to go to ER or return to medical center if symptoms don't improve, worsen or new problems develop. The patient verbalized understanding.   Follow-up: No follow-ups on file.   Haze LELON Servant, FNP-BC [facility] [place], [place] [phone]   12/23/2023, 6:08 PM

## 2023-12-31 ENCOUNTER — Telehealth: Payer: Self-pay | Admitting: Family Medicine

## 2023-12-31 ENCOUNTER — Ambulatory Visit
Admission: RE | Admit: 2023-12-31 | Discharge: 2023-12-31 | Disposition: A | Discharge status: Home / Self Care | Source: Ambulatory Visit | Attending: Family Medicine | Admitting: Family Medicine

## 2023-12-31 DIAGNOSIS — Z1231 Encounter for screening mammogram for malignant neoplasm of breast: Secondary | ICD-10-CM

## 2023-12-31 NOTE — Telephone Encounter (Signed)
 Copied from CRM #[id number]. Topic: Referral - Status >> Dec 31, 2023  8:41 AM Charlet HERO wrote:  Reason for CRM: Patient is calling about referral [id number] she is stating that she called the clinic and they state they have not rcvd ckd the chart and it shows was sent twice. Need someone to reach out the clinic to see why they have not gotten the referral and call the patient  back with update.

## 2023-12-31 NOTE — Telephone Encounter (Signed)
 Good Morning [facility],   Referral #[id number] sent twice per chart; clinic reports not received.

## 2024-01-04 NOTE — Progress Notes (Unsigned)
 BH MD/PA/NP OP Progress Note  01/05/2024 8:57 AM Sydney Harris  MRN:  [id number]  Visit Diagnosis:    ICD-10-CM   1. Encounter for long-term (current) use of medications  Z79.899 HgB A1c    Lipid panel    2. Generalized anxiety disorder  F41.1 traZODone  (DESYREL ) 100 MG tablet    escitalopram  (LEXAPRO ) 10 MG tablet    propranolol  (INDERAL ) 10 MG tablet    3. Bipolar 1 disorder (HCC)  F31.9 ARIPiprazole  (ABILIFY ) 15 MG tablet       Assessment: Sydney Harris is a 58 y.o. female with a history of bipolar disorder, schizophrenia, previous substance (cocaine) abuse,chronic pain, type 2 diabetes mellitus, and hypertension who presented to [facility] at [facility] for initial evaluation on 07/28/2023.    At initial evaluation patient reported a history of bipolar disorder and went on to describe manic episodes.  During periods of mania patient will experience decreased need for sleep, increased energy, pressured speech, disorganized thought process, and auditory/visual hallucinations.  She had been stable medications for a number of years and had a reoccurrence in symptoms towards the beginning of 2025 after her provider retired and she was off medications.  Patient has been experiencing increased fluctuation of moods with depression, irritability, and mania since then.  She has had thoughts of SI during this time but denied any intent to act on them.  She also has had thoughts of HI and was charged with assault.  Patient reported that this was more an impulsive action and not due to command hallucinations.  She does experience command hallucinations she has been able to prevent herself from acting on them.  In addition to the bipolar disorder patient does have a history of anxiety and marijuana use.  Sydney Harris presents for follow-up evaluation. Today, 01/05/2024, patient is experiencing an increase in anxiety, racing thoughts, restlessness, and  fatigue.  Notably these have only been present for a day and prior to this mood has been stable.  Anxiety symptoms had improved following last visit after titrating Lexapro .  Patient missed her Lexapro  dose yesterday morning which coincides with the worsening in her mood/anxiety.  Given this the reported anxiety today is most likely SSRI withdrawal.  Recommended restarting on Lexapro .  Furthermore will start Franzil as needed for anxiety.  Risk and benefits reviewed.  Patient will follow-up in 6 weeks.  Risk Assessment: An assessment of suicide and violence risk factors was performed as part of this evaluation and is not significantly changed from the last visit. While future psychiatric events cannot be accurately predicted, the patient does not currently require acute inpatient psychiatric care and does not currently meet [place]  involuntary commitment criteria. Patient was given contact information for crisis resources, behavioral health clinic and was instructed to call 911 for emergencies.   Plan: # Bipolar disorder Past medication trials:  Status of problem: Ongoing Interventions: - Continue Abilify  15 mg daily - CMP, CBC. A1c revieiwed  - due for lipid panel, repeat prior to next appointment  # Marijuana use diroder Past medication trials:  Status of problem: Ongoing Interventions: -- Recommended cessation  # GAD Past medication trials:  Status of problem: Ongoing Interventions: - Continue Lexapro  10 mg daily - Continue trazodone  100 mg at bedtime - Start propranolol  10 mg BID prn for anxiety  Chief Complaint:  Chief Complaint  Patient presents with   Follow-up   HPI: Sydney Harris reports that she is not feeling too well today and switched  over to virtual. Patient describes the not feeling well as anxiety and prior to yesterday things been going well.  She has been having anxiety and worrying about a lot of stuff since yesterday. There is a lot on her right now and she  is trying to deal with it. The medical issues with her diabetes. The lexapro  increase last time helped a lot per the patient. She reports that is helped with the mood swings and her anxiety. She did miss it yesterday and we reviewed SSRI withdrawal.  Given symptoms only present 1 day they are most likely related to this.  We did agree to start as needed propranolol  in addition to help manage any anxiety symptoms.  Past Psychiatric History:  Past psychiatric diagnoses: Bipolar disorder, schizophrenia Psychiatric hospitalizations:Patient has been hospitalized at [facility] in the [facility] Past suicide attempts: Denies Hx of self harm: Denies Hx of violence towards others: Assault charges recently charges dropped Prior psychiatric providers: PCP Dr. Brien managed him for long time, [facility] in the past Prior therapy: Denies Access to firearms: Denies  Prior medication trials: Denies   Substance use: She reports that she smokes marijuana and it calms her down. She finds it helpful and has been doing it for years. She wants to stop as she is on pain medication and does not want to lose it. When she was on her psychiatric medication in the past she did not smoke as much.  She has used cocaine in the past with her last use being in 2023.  Patient has attended treatment for substance use in the past.  Past Medical History:  Past Medical History:  Diagnosis Date   Anxiety    Arthritis of left knee    Bipolar disorder (HCC)    Blood transfusion 1981   related to my daughter being born   Depression    Diabetes mellitus without complication (HCC)    Fibroid tumor    Gout    Hypertension    Plantar fasciitis    Substance abuse (HCC)    crack cocaine  14 yrs sober.    Past Surgical History:  Procedure Laterality Date   ABDOMINAL HYSTERECTOMY     for fibroid tumors    Family History:  Family History  Problem Relation Age of Onset   Diabetes Father    Asthma Daughter    Heart failure Neg Hx     Cancer Neg Hx    Colon polyps Neg Hx    Colon cancer Neg Hx    Esophageal cancer Neg Hx    Rectal cancer Neg Hx    Stomach cancer Neg Hx    Breast cancer Neg Hx     Social History:  Social History   Socioeconomic History   Marital status: Single    Spouse name: Not on file   Number of children: Not on file   Years of education: Not on file   Highest education level: 9th grade  Occupational History   Not on file  Tobacco Use   Smoking status: Former    Current packs/day: 0.00    Average packs/day: 0.5 packs/day for 33.0 years (16.5 ttl pk-yrs)    Types: Cigarettes    Start date: 56    Quit date: 2018    Years since quitting: 7.9   Smokeless tobacco: Never  Vaping Use   Vaping status: Never Used  Substance and Sexual Activity   Alcohol  use: No   Drug use: Not Currently    Types:  Crack cocaine, Marijuana    Comment: clean since 2015; currently chews CBD gummies   Sexual activity: Yes    Partners: Male    Birth control/protection: None  Other Topics Concern   Not on file  Social History Narrative   Not on file   Social Drivers of Health   Tobacco Use: Medium Risk (12/23/2023)   Patient History    Smoking Tobacco Use: Former    Smokeless Tobacco Use: Never    Passive Exposure: Not on file  Financial Resource Strain: Low Risk (09/10/2022)   Overall Financial Resource Strain (CARDIA)    Difficulty of Paying Living Expenses: Not hard at all  Food Insecurity: Patient Declined (12/06/2022)   Hunger Vital Sign    Worried About Running Out of Food in the Last Year: Patient declined    Ran Out of Food in the Last Year: Patient declined  Transportation Needs: Patient Declined (12/06/2022)   PRAPARE - Administrator, Civil Service (Medical): Patient declined    Lack of Transportation (Non-Medical): Patient declined  Physical Activity: Inactive (09/10/2022)   Exercise Vital Sign    Days of Exercise per Week: 0 days    Minutes of Exercise per Session: 0  min  Stress: No Stress Concern Present (09/10/2022)   [employer] of Occupational Health - Occupational Stress Questionnaire    Feeling of Stress : Not at all  Social Connections: Moderately Isolated (09/10/2022)   Social Connection and Isolation Panel    Frequency of Communication with Friends and Family: Twice a week    Frequency of Social Gatherings with Friends and Family: Three times a week    Attends Religious Services: More than 4 times per year    Active Member of Clubs or Organizations: No    Attends Banker Meetings: Never    Marital Status: Never married  Depression (PHQ2-9): Medium Risk (07/28/2023)   Depression (PHQ2-9)    PHQ-2 Score: 7  Alcohol  Screen: Low Risk (09/10/2022)   Alcohol  Screen    Last Alcohol  Screening Score (AUDIT): 0  Housing: Patient Declined (12/06/2022)   Housing Stability Vital Sign    Unable to Pay for Housing in the Last Year: Patient declined    Number of Times Moved in the Last Year: Not on file    Homeless in the Last Year: Patient declined  Utilities: Patient Declined (12/06/2022)   AHC Utilities    Threatened with loss of utilities: Patient declined  Health Literacy: Adequate Health Literacy (09/10/2022)   B1300 Health Literacy    Frequency of need for help with medical instructions: Rarely    Allergies:  Allergies  Allergen Reactions   Penicillins Hives    Has patient had a PCN reaction causing immediate rash, facial/tongue/throat swelling, SOB or lightheadedness with hypotension: Yes Has patient had a PCN reaction causing severe rash involving mucus membranes or skin necrosis: Yes Has patient had a PCN reaction that required hospitalization No Has patient had a PCN reaction occurring within the last 10 years: Yes If all of the above answers are NO, then may proceed with Cephalosporin use.    Tomato     hives    Current Medications: Current Outpatient Medications  Medication Sig Dispense Refill   propranolol   (INDERAL ) 10 MG tablet Take 1 tablet (10 mg total) by mouth 2 (two) times daily as needed. 60 tablet 2   ARIPiprazole  (ABILIFY ) 15 MG tablet Take 1 tablet (15 mg total) by mouth at bedtime. 30 tablet 2  atorvastatin  (LIPITOR) 10 MG tablet Take 1 tablet (10 mg total) by mouth daily. 90 tablet 1   baclofen  (LIORESAL ) 10 MG tablet TAKE 1 TABLET BY MOUTH THREE TIMES DAILY 90 tablet 2   benztropine  (COGENTIN ) 0.5 MG tablet Take 1 tablet (0.5 mg total) by mouth at bedtime. 30 tablet 4   Blood Glucose Monitoring Suppl (GNP TRUE METRIX AIR METER) w/Device KIT Use to test blood sugar 3 times a day 1 kit 0   dapagliflozin  propanediol (FARXIGA ) 5 MG TABS tablet Take 1 tablet (5 mg total) by mouth daily. 90 tablet 1   diclofenac  Sodium (VOLTAREN ) 1 % GEL Apply 4 g topically 4 (four) times daily. 350 g 0   escitalopram  (LEXAPRO ) 10 MG tablet Take 1 tablet (10 mg total) by mouth daily. 30 tablet 2   gabapentin  (NEURONTIN ) 300 MG capsule Take 1 capsule (300 mg total) by mouth 3 (three) times daily. 90 capsule 1   glucose blood (GNP TRUE METRIX GLUCOSE STRIPS) test strip Use as instructed 100 each 12   latanoprost (XALATAN) 0.005 % ophthalmic solution Place 1 drop into both eyes at bedtime.     losartan -hydrochlorothiazide  (HYZAAR ) 50-12.5 MG tablet Take 1 tablet by mouth daily. 90 tablet 1   meloxicam  (MOBIC ) 15 MG tablet Take 1 tablet (15 mg total) by mouth daily. 30 tablet 0   metFORMIN  (GLUCOPHAGE ) 500 MG tablet Take 500 mg by mouth every morning. (Patient not taking: Reported on 12/23/2023)     Misc. Devices [employer] Dagnosis- left knee pain 1 each 0   Multiple Vitamin (MULTIVITAMIN ADULT PO) Take by mouth daily.     Multiple Vitamins-Minerals (HAIR SKIN AND NAILS FORMULA PO) Take by mouth daily.     naloxone (NARCAN) nasal spray 4 mg/0.1 mL SMARTSIG:Both Nares (Patient not taking: Reported on 12/23/2023)     oxyCODONE -acetaminophen  (PERCOCET) 10-325 MG tablet Take 1 tablet by mouth 4 (four) times daily.  (Patient not taking: Reported on 12/23/2023)     timolol (TIMOPTIC) 0.5 % ophthalmic solution 1 drop every morning.     traZODone  (DESYREL ) 100 MG tablet Take 1 tablet (100 mg total) by mouth at bedtime. 30 tablet 2   TRUEplus Lancets 33G MISC Use to test blood sugar 3 times a day 100 each 12   Vitamin D , Ergocalciferol , (DRISDOL ) 1.25 MG (50000 UNIT) CAPS capsule Take 1 capsule (50,000 Units total) by mouth once a week. 12 capsule 4   No current facility-administered medications for this visit.     Musculoskeletal: Strength & Muscle Tone: within normal limits Gait & Station: normal Patient leans: N/A  Psychiatric Specialty Exam: Last menstrual period 12/28/2010.There is no height or weight on file to calculate BMI. Review of Systems  General Appearance: Well Groomed  Eye Contact:  Good  Speech:  Clear and Coherent and Normal Rate  Volume:  Normal  Mood:  dysthymic  Affect:  Congruent  Thought Content: Logical   Suicidal Thoughts:  No  Homicidal Thoughts:  No  Thought Process:  Coherent and Goal Directed  Orientation:  Full (Time, Place, and Person)    Memory: Immediate;   Fair  Judgment:  Fair  Insight:  Fair  Concentration:  Concentration: Fair  Recall:  not formally assessed   Fund of Knowledge: Fair  Language: Good  Psychomotor Activity:  Normal  Akathisia:  No  AIMS (if indicated): not done  Assets:  Communication Skills Desire for Improvement Housing Transportation  ADL's:  Intact  Cognition: WNL  Sleep:  Good  Metabolic Disorder Labs: Lab Results  Component Value Date   HGBA1C 6.3 11/18/2023   No results found for: PROLACTIN Lab Results  Component Value Date   CHOL 189 07/03/2022   TRIG 86 07/03/2022   HDL 74 07/03/2022   CHOLHDL 2.6 07/03/2022   LDLCALC 100 (H) 07/03/2022   LDLCALC 118 (H) 10/02/2021   No results found for: TSH  Therapeutic Level Labs: No results found for: LITHIUM No results found for: VALPROATE No results found for:  CBMZ   Screenings: GAD-7    Flowsheet Row [facility] Office Visit from 08/13/2022 in [facility] [place] - A Dept Of [place]. [facility] Office Visit from 02[facility]12/2022 in [facility] - A Dept Of [place]. [facility] Office Visit from 07/30/2020 in [facility] [place] - A Dept Of [place]. [facility] Office Visit from 03/28/2020 in [facility] [place] - A Dept Of [place]. [facility]  Total GAD-7 Score 15 10 7 7 6    PHQ2-9    Flowsheet Row Office Visit from 07/28/2023 in [facility] Clinical Support from 09/10/2022 in [facility] - A Dept Of [place]. [facility] Office Visit from 08/13/2022 in [facility] - A Dept Of [place]. [facility] Office Visit from 02/12/2022 in [facility] [place] - A Dept Of [place]. [facility] Clinical Support from 06/07/2021 in [facility]  PHQ-2 Total Score 3 2 4 4 1   PHQ-9 Total Score 7 9 14 7  --   Flowsheet Row UC from 08/10/2023 in [facility] Visit from 07/28/2023 in [facility] ED from 04/13/2023 in [facility] Emergency Department at [facility]  C-SSRS RISK CATEGORY No Risk No Risk No Risk    Collaboration of Care: Collaboration of Care: Medication Management AEB medication prescription and Other provider involved in patient's care AEB internal medicine chart review  Patient/Guardian was advised Release of Information must be obtained prior to any record release in order to collaborate their care with an outside provider. Patient/Guardian was advised if they have not already done so to contact the registration department to sign all necessary forms in order for us  to release  information regarding their care.   Consent: Patient/Guardian gives verbal consent for treatment and assignment of benefits for services provided during this visit. Patient/Guardian expressed understanding and agreed to proceed.    Virtual Visit via Video Note  I connected with Izamar Auvil on 01/05/2024 at  8:30 AM EST by a video enabled telemedicine application and verified that I am speaking with the correct person using two identifiers.  Location: Patient: Home Provider: Home Office   I discussed the limitations of evaluation and management by telemedicine and the availability of in person appointments. The patient expressed understanding and agreed to proceed.   I discussed the assessment and treatment plan with the patient. The patient was provided an opportunity to ask questions and all were answered. The patient agreed with the plan and demonstrated an understanding of the instructions.   The patient was advised to call back or seek an in-person evaluation if the symptoms worsen or if the condition fails to improve as anticipated.  I provided 15 minutes of non-face-to-face time during this encounter.  Arvella CHRISTELLA Finder, MD 01/05/2024, 8:58 AM

## 2024-01-05 ENCOUNTER — Encounter (HOSPITAL_COMMUNITY): Payer: Self-pay | Admitting: Psychiatry

## 2024-01-05 ENCOUNTER — Telehealth (HOSPITAL_COMMUNITY): Admitting: Psychiatry

## 2024-01-05 DIAGNOSIS — Z79899 Other long term (current) drug therapy: Secondary | ICD-10-CM

## 2024-01-05 DIAGNOSIS — F411 Generalized anxiety disorder: Secondary | ICD-10-CM

## 2024-01-05 DIAGNOSIS — F319 Bipolar disorder, unspecified: Secondary | ICD-10-CM

## 2024-01-05 MED ORDER — ESCITALOPRAM OXALATE 10 MG PO TABS: 10.0000 mg | ORAL_TABLET | Freq: Every day | ORAL | 2 refills | Status: DC

## 2024-01-05 MED ORDER — TRAZODONE HCL 100 MG PO TABS: 100.0000 mg | ORAL_TABLET | Freq: Every day | ORAL | 2 refills | Status: DC

## 2024-01-05 MED ORDER — ARIPIPRAZOLE 15 MG PO TABS: 15.0000 mg | ORAL_TABLET | Freq: Every day | ORAL | 2 refills | Status: DC

## 2024-01-05 MED ORDER — PROPRANOLOL HCL 10 MG PO TABS: 10.0000 mg | ORAL_TABLET | Freq: Two times a day (BID) | ORAL | 2 refills | Status: AC | PRN

## 2024-01-06 ENCOUNTER — Ambulatory Visit: Payer: Self-pay

## 2024-01-06 NOTE — Telephone Encounter (Signed)
 FYI Only or Action Required?: Action required by provider: clinical question for provider and update on Sydney Harris condition.  Sydney Harris was last seen in primary care on 12/23/2023 by Theotis Haze ORN, NP.  Called Nurse Triage reporting Follow-up.  Interventions attempted: Prescription medications: tylenol ; metformin .  Symptoms are: gradually worsening.  Triage Disposition: Call PCP When Office is Open  Sydney Harris/caregiver understands and will follow disposition?: Yes   Copied from CRM #[id number]. Topic: Clinical - Medication Question >> Jan 06, 2024  3:47 PM Sydney Harris wrote: Reason for CRM: Sydney Harris is requesting to see if Sydney Harris can be prescribed Prednisone  until Sydney Harris is able to get scheduled at the pain clinic.  Sydney Harris can be reached at [phone] Reason for Disposition  [1] Caller requesting NON-URGENT health information AND [2] PCP's office is the best resource  Answer Assessment - Initial Assessment Questions 1. REASON FOR CALL: What is the main reason for your call? or How can I best help you?     Pt calling to f/u on referral to pain clinic; states that pain is increasing and Sydney Harris does not want to continue metformin . Pt requesting prednisone  be sent to [facility] pharmacy until Sydney Harris can get pain clinic to schedule her. Sydney Harris states Sydney Harris has f/u with them but they report no referral sent. Per chart, referral has been sent x2 without response from pain clinic. Discussed I would send request to PCP and clinic could reach out regarding referral. Please advise.  Protocols used: Information Only Call - No Triage-A-AH

## 2024-01-06 NOTE — Telephone Encounter (Signed)
 Hi Altamese,   Any update on the referral?

## 2024-01-06 NOTE — Telephone Encounter (Signed)
 Copied from CRM #[id number]. Topic: Referral - Question >> Jan 06, 2024  3:44 PM Willma R wrote:  Reason for CRM: Patient has contacted pain management to schedule based on the referral submitted and they continue to tell her that there is no referral. Patient is requesting the office to try to contact them to schedule her an appointment.  Pain clinic: [phone]  Patient can be reached at [phone]

## 2024-01-07 ENCOUNTER — Other Ambulatory Visit: Payer: Self-pay | Admitting: Family Medicine

## 2024-01-07 MED ORDER — PREDNISONE 20 MG PO TABS: 20.0000 mg | ORAL_TABLET | Freq: Every day | ORAL | 0 refills | Status: DC

## 2024-01-07 NOTE — Telephone Encounter (Addendum)
 Hi Altamese,  Thank you for following up.

## 2024-01-07 NOTE — Telephone Encounter (Signed)
 Prescription for prednisone  has been sent to her pharmacy.

## 2024-01-07 NOTE — Telephone Encounter (Signed)
 Patient is requesting medication to hold until she gets appointment with pain clinic. They have not received her referral.

## 2024-01-07 NOTE — Telephone Encounter (Signed)
 Patient has been informed.

## 2024-01-07 NOTE — Telephone Encounter (Signed)
 Call placed to [facility] and they state that they have not received the referral can you please refax to the office.

## 2024-01-24 ENCOUNTER — Other Ambulatory Visit: Payer: Self-pay | Admitting: Nurse Practitioner

## 2024-01-24 DIAGNOSIS — M2550 Pain in unspecified joint: Secondary | ICD-10-CM

## 2024-01-31 ENCOUNTER — Encounter (HOSPITAL_COMMUNITY): Payer: Self-pay | Admitting: Emergency Medicine

## 2024-01-31 ENCOUNTER — Emergency Department (HOSPITAL_COMMUNITY)

## 2024-01-31 ENCOUNTER — Other Ambulatory Visit: Payer: Self-pay

## 2024-01-31 ENCOUNTER — Emergency Department (HOSPITAL_COMMUNITY)
Admission: EM | Admit: 2024-01-31 | Discharge: 2024-01-31 | Disposition: A | Discharge status: Home / Self Care | Attending: Emergency Medicine | Admitting: Emergency Medicine

## 2024-01-31 DIAGNOSIS — I10 Essential (primary) hypertension: Secondary | ICD-10-CM | POA: Insufficient documentation

## 2024-01-31 DIAGNOSIS — Z79899 Other long term (current) drug therapy: Secondary | ICD-10-CM | POA: Diagnosis not present

## 2024-01-31 DIAGNOSIS — M542 Cervicalgia: Secondary | ICD-10-CM | POA: Insufficient documentation

## 2024-01-31 DIAGNOSIS — Y92410 Unspecified street and highway as the place of occurrence of the external cause: Secondary | ICD-10-CM | POA: Insufficient documentation

## 2024-01-31 DIAGNOSIS — Z23 Encounter for immunization: Secondary | ICD-10-CM | POA: Diagnosis not present

## 2024-01-31 DIAGNOSIS — R519 Headache, unspecified: Secondary | ICD-10-CM | POA: Diagnosis present

## 2024-01-31 DIAGNOSIS — M545 Low back pain, unspecified: Secondary | ICD-10-CM | POA: Diagnosis not present

## 2024-01-31 DIAGNOSIS — E119 Type 2 diabetes mellitus without complications: Secondary | ICD-10-CM | POA: Insufficient documentation

## 2024-01-31 DIAGNOSIS — S0181XA Laceration without foreign body of other part of head, initial encounter: Secondary | ICD-10-CM | POA: Diagnosis not present

## 2024-01-31 DIAGNOSIS — T148XXA Other injury of unspecified body region, initial encounter: Secondary | ICD-10-CM

## 2024-01-31 LAB — COMPREHENSIVE METABOLIC PANEL WITH GFR
ALT: 23 U/L (ref 0–44)
AST: 36 U/L (ref 15–41)
Albumin: 4.5 g/dL (ref 3.5–5.0)
Alkaline Phosphatase: 68 U/L (ref 38–126)
Anion gap: 12 (ref 5–15)
BUN: 18 mg/dL (ref 6–20)
CO2: 25 mmol/L (ref 22–32)
Calcium: 10.2 mg/dL (ref 8.9–10.3)
Chloride: 104 mmol/L (ref 98–111)
Creatinine, Ser: 0.92 mg/dL (ref 0.44–1.00)
GFR, Estimated: >60 mL/min
Glucose, Bld: 124 mg/dL — ABNORMAL HIGH (ref 70–99)
Potassium: 3.6 mmol/L (ref 3.5–5.1)
Sodium: 141 mmol/L (ref 135–145)
Total Bilirubin: 0.3 mg/dL (ref 0.0–1.2)
Total Protein: 7.5 g/dL (ref 6.5–8.1)

## 2024-01-31 LAB — CBC WITH DIFFERENTIAL/PLATELET
Abs Immature Granulocytes: 0.04 K/uL (ref 0.00–0.07)
Basophils Absolute: 0.1 K/uL (ref 0.0–0.1)
Basophils Relative: 1 %
Eosinophils Absolute: 0.2 K/uL (ref 0.0–0.5)
Eosinophils Relative: 2 %
HCT: 44.9 % (ref 36.0–46.0)
Hemoglobin: 14.6 g/dL (ref 12.0–15.0)
Immature Granulocytes: 0 %
Lymphocytes Relative: 29 %
Lymphs Abs: 3.1 K/uL (ref 0.7–4.0)
MCH: 29 pg (ref 26.0–34.0)
MCHC: 32.5 g/dL (ref 30.0–36.0)
MCV: 89.1 fL (ref 80.0–100.0)
Monocytes Absolute: 0.8 K/uL (ref 0.1–1.0)
Monocytes Relative: 7 %
Neutro Abs: 6.5 K/uL (ref 1.7–7.7)
Neutrophils Relative %: 61 %
Platelets: 321 K/uL (ref 150–400)
RBC: 5.04 MIL/uL (ref 3.87–5.11)
RDW: 14.9 % (ref 11.5–15.5)
WBC: 10.7 K/uL — ABNORMAL HIGH (ref 4.0–10.5)
nRBC: 0 % (ref 0.0–0.2)

## 2024-01-31 MED ORDER — MORPHINE SULFATE (PF) 4 MG/ML IV SOLN
4.0000 mg | Freq: Once | INTRAVENOUS | Status: AC
  Administered 2024-01-31: 4 mg via INTRAVENOUS
  Filled 2024-01-31: qty 1

## 2024-01-31 MED ORDER — ONDANSETRON HCL 4 MG/2ML IJ SOLN
4.0000 mg | Freq: Once | INTRAMUSCULAR | Status: AC
  Administered 2024-01-31: 4 mg via INTRAVENOUS
  Filled 2024-01-31: qty 2

## 2024-01-31 MED ORDER — LIDOCAINE-EPINEPHRINE-TETRACAINE (LET) TOPICAL GEL
3.0000 mL | Freq: Once | TOPICAL | Status: AC
  Administered 2024-01-31: 3 mL via TOPICAL
  Filled 2024-01-31: qty 3

## 2024-01-31 MED ORDER — SULFAMETHOXAZOLE-TRIMETHOPRIM 800-160 MG PO TABS: 1.0000 | ORAL_TABLET | Freq: Two times a day (BID) | ORAL | 0 refills | Status: AC

## 2024-01-31 MED ORDER — TETANUS-DIPHTH-ACELL PERTUSSIS 5-2-15.5 LF-MCG/0.5 IM SUSP
0.5000 mL | Freq: Once | INTRAMUSCULAR | Status: AC
  Administered 2024-01-31: 0.5 mL via INTRAMUSCULAR
  Filled 2024-01-31: qty 0.5

## 2024-01-31 MED ORDER — OXYCODONE-ACETAMINOPHEN 5-325 MG PO TABS: 1.0000 | ORAL_TABLET | Freq: Four times a day (QID) | ORAL | 0 refills | Status: AC | PRN

## 2024-01-31 MED ORDER — FENTANYL CITRATE (PF) 50 MCG/ML IJ SOSY
25.0000 ug | PREFILLED_SYRINGE | Freq: Once | INTRAMUSCULAR | Status: AC
  Administered 2024-01-31: 25 ug via INTRAVENOUS
  Filled 2024-01-31: qty 1

## 2024-01-31 MED ORDER — FENTANYL CITRATE (PF) 50 MCG/ML IJ SOSY
12.5000 ug | PREFILLED_SYRINGE | Freq: Once | INTRAMUSCULAR | Status: AC
  Administered 2024-01-31: 12.5 ug via INTRAVENOUS
  Filled 2024-01-31: qty 1
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
## 2024-01-31 NOTE — ED Notes (Signed)
 Patient said C-collar is uncomfortable and is hurting her neck. Patient reminded that C-collar is in place to protect from possible neck injuries worsening. RN told patient I am able to readjust the collar, but I am not able to remove it unless cleared/directed to do so by EDP. Patient said to leave it; that she will take it off anyway.

## 2024-01-31 NOTE — ED Notes (Signed)
 Daughter Glennis Sharps [phone] would like an update asap

## 2024-02-01 ENCOUNTER — Ambulatory Visit (HOSPITAL_COMMUNITY)

## 2024-02-08 ENCOUNTER — Ambulatory Visit (HOSPITAL_COMMUNITY)

## 2024-02-10 ENCOUNTER — Ambulatory Visit: Payer: Self-pay

## 2024-02-10 NOTE — Telephone Encounter (Signed)
 Routing to PCP for review.
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
## 2024-02-23 ENCOUNTER — Telehealth (HOSPITAL_COMMUNITY): Admitting: Psychiatry

## 2024-02-23 ENCOUNTER — Encounter (HOSPITAL_COMMUNITY): Payer: Self-pay | Admitting: Psychiatry

## 2024-02-23 ENCOUNTER — Other Ambulatory Visit: Payer: Self-pay | Admitting: Family Medicine

## 2024-02-23 DIAGNOSIS — F319 Bipolar disorder, unspecified: Secondary | ICD-10-CM

## 2024-02-23 DIAGNOSIS — F411 Generalized anxiety disorder: Secondary | ICD-10-CM | POA: Diagnosis not present

## 2024-02-23 MED ORDER — TRAZODONE HCL 100 MG PO TABS: 100.0000 mg | ORAL_TABLET | Freq: Every day | ORAL | 2 refills | Status: AC

## 2024-02-23 MED ORDER — ESCITALOPRAM OXALATE 10 MG PO TABS: 10.0000 mg | ORAL_TABLET | Freq: Every day | ORAL | 2 refills | Status: AC

## 2024-02-23 MED ORDER — ARIPIPRAZOLE 15 MG PO TABS: 15.0000 mg | ORAL_TABLET | Freq: Every day | ORAL | 2 refills | Status: AC

## 2024-02-23 NOTE — Telephone Encounter (Unsigned)
 Copied from CRM [id number]. Topic: Clinical - Medication Refill >> Feb 23, 2024 12:35 PM [name] wrote: Medication: predniSONE  (DELTASONE ) 20 MG tablet   Has the patient contacted their pharmacy? No (Agent: If no, request that the patient contact the pharmacy for the refill. If patient does not wish to contact the pharmacy document the reason why and proceed with request.) (Agent: If yes, when and what did the pharmacy advise?)  This is the patient's preferred pharmacy:  [facility] [address] [place], [place] - [address] [place] [address] Phone: [phone] Fax: [phone]    Is this the correct pharmacy for this prescription? Yes If no, delete pharmacy and type the correct one.   Has the prescription been filled recently? No  Is the patient out of the medication? Yes  Has the patient been seen for an appointment in the last year OR does the patient have an upcoming appointment? Yes  Can we respond through MyChart? Yes  Agent: Please be advised that Rx refills may take up to 3 business days. We ask that you follow-up with your pharmacy.

## 2024-02-25 MED ORDER — PREDNISONE 20 MG PO TABS: 20.0000 mg | ORAL_TABLET | Freq: Every day | ORAL | 0 refills | Status: AC

## 2024-02-25 NOTE — Telephone Encounter (Signed)
 Requested medication (s) are due for refill today: routing for review  Requested medication (s) are on the active medication list: yes  Last refill:  01/07/24  Future visit scheduled: yes  Notes to clinic:  Unable to refill per protocol, cannot delegate.      Requested Prescriptions  Pending Prescriptions Disp Refills   predniSONE  (DELTASONE ) 20 MG tablet 5 tablet 0    Sig: Take 1 tablet (20 mg total) by mouth daily with breakfast.     Not Delegated - Endocrinology:  Oral Corticosteroids Failed - 02/25/2024 10:00 AM      Failed - This refill cannot be delegated      Failed - Manual Review: Eye exam for IOP if prolonged treatment      Failed - Glucose (serum) in normal range and within 180 days    Glucose, Bld  Date Value Ref Range Status  01/31/2024 124 (H) 70 - 99 mg/dL Final    Comment:    Glucose reference range applies only to samples taken after fasting for at least 8 hours.   POC Glucose  Date Value Ref Range Status  05/05/2023 99 70 - 99 mg/dl Final   Glucose Fasting, POC  Date Value Ref Range Status  03/28/2020 110 (A) 70 - 99 mg/dL Final   Glucose-Capillary  Date Value Ref Range Status  06/12/2017 113 (H) 65 - 99 mg/dL Final         Failed - Bone Mineral Density or Dexa Scan completed in the last 2 years      Passed - K in normal range and within 180 days    Potassium  Date Value Ref Range Status  01/31/2024 3.6 3.5 - 5.1 mmol/L Final         Passed - Na in normal range and within 180 days    Sodium  Date Value Ref Range Status  01/31/2024 141 135 - 145 mmol/L Final  11/18/2023 142 134 - 144 mmol/L Final         Passed - Last BP in normal range    BP Readings from Last 1 Encounters:  01/31/24 132/84         Passed - Valid encounter within last 6 months    Recent Outpatient Visits           2 months ago Arthralgia of multiple joints   [facility] - [facility] Of [place]. [facility] [place], [place] W, NP   3 months  ago Type 2 diabetes mellitus with other specified complication, without long-term current use of insulin (HCC)   [facility] - A Dept Of [place]. [facility] [address] Delbert Clam, MD   6 months ago Adhesive capsulitis of both shoulders   [facility] - A Dept Of [place]. [facility] Delbert Clam, MD   9 months ago Type 2 diabetes mellitus with other specified complication, without long-term current use of insulin (HCC)   [facility] - [facility] Dept Of [place]. [facility] Delbert Clam, MD   9 months ago Type 2 diabetes mellitus with other specified complication, without long-term current use of insulin (HCC)   [facility] - A Dept Of [place]. [facility] Delbert Clam, MD

## 2024-04-28 ENCOUNTER — Ambulatory Visit (HOSPITAL_COMMUNITY): Admitting: Psychiatry
